# Patient Record
Sex: Female | Born: 1975 | Race: White | Hispanic: No | Smoking: Current every day smoker
Health system: Southern US, Community
[De-identification: ages and names within clinical notes are randomized; demographics above are authoritative.]

## PROBLEM LIST (undated history)

## (undated) DIAGNOSIS — C801 Malignant (primary) neoplasm, unspecified: Secondary | ICD-10-CM

## (undated) DIAGNOSIS — K219 Gastro-esophageal reflux disease without esophagitis: Secondary | ICD-10-CM

## (undated) DIAGNOSIS — I10 Essential (primary) hypertension: Secondary | ICD-10-CM

## (undated) DIAGNOSIS — G8929 Other chronic pain: Secondary | ICD-10-CM

## (undated) DIAGNOSIS — N96 Recurrent pregnancy loss: Secondary | ICD-10-CM

## (undated) DIAGNOSIS — F32A Depression, unspecified: Secondary | ICD-10-CM

## (undated) DIAGNOSIS — R569 Unspecified convulsions: Secondary | ICD-10-CM

## (undated) DIAGNOSIS — N390 Urinary tract infection, site not specified: Secondary | ICD-10-CM

## (undated) DIAGNOSIS — C189 Malignant neoplasm of colon, unspecified: Secondary | ICD-10-CM

## (undated) DIAGNOSIS — R519 Headache, unspecified: Secondary | ICD-10-CM

## (undated) DIAGNOSIS — E0590 Thyrotoxicosis, unspecified without thyrotoxic crisis or storm: Secondary | ICD-10-CM

## (undated) DIAGNOSIS — F419 Anxiety disorder, unspecified: Secondary | ICD-10-CM

## (undated) DIAGNOSIS — F329 Major depressive disorder, single episode, unspecified: Secondary | ICD-10-CM

## (undated) DIAGNOSIS — T7840XA Allergy, unspecified, initial encounter: Secondary | ICD-10-CM

## (undated) DIAGNOSIS — E0500 Thyrotoxicosis with diffuse goiter without thyrotoxic crisis or storm: Secondary | ICD-10-CM

## (undated) DIAGNOSIS — K5730 Diverticulosis of large intestine without perforation or abscess without bleeding: Secondary | ICD-10-CM

## (undated) DIAGNOSIS — R51 Headache: Secondary | ICD-10-CM

## (undated) DIAGNOSIS — K8020 Calculus of gallbladder without cholecystitis without obstruction: Secondary | ICD-10-CM

## (undated) HISTORY — DX: Headache: R51

## (undated) HISTORY — DX: Malignant neoplasm of colon, unspecified: C18.9

## (undated) HISTORY — DX: Diverticulosis of large intestine without perforation or abscess without bleeding: K57.30

## (undated) HISTORY — PX: PORTACATH PLACEMENT: SHX2246

## (undated) HISTORY — PX: ORIF ACETABULAR FRACTURE: SHX5029

## (undated) HISTORY — DX: Gastro-esophageal reflux disease without esophagitis: K21.9

## (undated) HISTORY — DX: Anxiety disorder, unspecified: F41.9

## (undated) HISTORY — DX: Allergy, unspecified, initial encounter: T78.40XA

## (undated) HISTORY — DX: Calculus of gallbladder without cholecystitis without obstruction: K80.20

## (undated) HISTORY — DX: Depression, unspecified: F32.A

## (undated) HISTORY — PX: OTHER SURGICAL HISTORY: SHX169

## (undated) HISTORY — DX: Major depressive disorder, single episode, unspecified: F32.9

## (undated) HISTORY — DX: Recurrent pregnancy loss: N96

## (undated) HISTORY — DX: Headache, unspecified: R51.9

## (undated) HISTORY — PX: DILATION AND CURETTAGE OF UTERUS: SHX78

## (undated) HISTORY — DX: Other chronic pain: G89.29

## (undated) HISTORY — DX: Malignant (primary) neoplasm, unspecified: C80.1

## (undated) HISTORY — DX: Urinary tract infection, site not specified: N39.0

## (undated) HISTORY — DX: Unspecified convulsions: R56.9

## (undated) HISTORY — PX: HAND SURGERY: SHX662

## (undated) HISTORY — DX: Thyrotoxicosis, unspecified without thyrotoxic crisis or storm: E05.90

---
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
## 1997-10-22 ENCOUNTER — Emergency Department (HOSPITAL_COMMUNITY): Admission: EM | Admit: 1997-10-22 | Discharge: 1997-10-22 | Payer: Self-pay

## 1997-11-15 ENCOUNTER — Emergency Department (HOSPITAL_COMMUNITY): Admission: EM | Admit: 1997-11-15 | Discharge: 1997-11-15 | Payer: Self-pay | Admitting: *Deleted

## 1998-01-22 ENCOUNTER — Inpatient Hospital Stay (HOSPITAL_COMMUNITY): Admission: AD | Admit: 1998-01-22 | Discharge: 1998-01-22 | Payer: Self-pay | Admitting: *Deleted

## 1998-01-23 ENCOUNTER — Encounter: Payer: Self-pay | Admitting: *Deleted

## 1998-01-23 ENCOUNTER — Inpatient Hospital Stay (HOSPITAL_COMMUNITY): Admission: AD | Admit: 1998-01-23 | Discharge: 1998-01-23 | Payer: Self-pay | Admitting: *Deleted

## 1998-02-27 ENCOUNTER — Inpatient Hospital Stay (HOSPITAL_COMMUNITY): Admission: AD | Admit: 1998-02-27 | Discharge: 1998-02-27 | Payer: Self-pay | Admitting: Obstetrics

## 2001-12-01 ENCOUNTER — Inpatient Hospital Stay (HOSPITAL_COMMUNITY): Admission: AD | Admit: 2001-12-01 | Discharge: 2001-12-01 | Payer: Self-pay | Admitting: Obstetrics and Gynecology

## 2003-11-07 ENCOUNTER — Emergency Department (HOSPITAL_COMMUNITY): Admission: EM | Admit: 2003-11-07 | Discharge: 2003-11-07 | Payer: Self-pay | Admitting: Family Medicine

## 2003-12-29 ENCOUNTER — Emergency Department (HOSPITAL_COMMUNITY): Admission: EM | Admit: 2003-12-29 | Discharge: 2003-12-29 | Payer: Self-pay | Admitting: Emergency Medicine

## 2004-07-15 ENCOUNTER — Emergency Department (HOSPITAL_COMMUNITY): Admission: EM | Admit: 2004-07-15 | Discharge: 2004-07-15 | Payer: Self-pay | Admitting: Family Medicine

## 2004-07-15 ENCOUNTER — Ambulatory Visit (HOSPITAL_COMMUNITY): Admission: RE | Admit: 2004-07-15 | Discharge: 2004-07-15 | Payer: Self-pay | Admitting: Family Medicine

## 2006-07-26 ENCOUNTER — Emergency Department (HOSPITAL_COMMUNITY): Admission: EM | Admit: 2006-07-26 | Discharge: 2006-07-26 | Payer: Self-pay | Admitting: Family Medicine

## 2006-08-13 ENCOUNTER — Emergency Department (HOSPITAL_COMMUNITY): Admission: EM | Admit: 2006-08-13 | Discharge: 2006-08-13 | Payer: Self-pay | Admitting: Family Medicine

## 2008-05-11 ENCOUNTER — Emergency Department (HOSPITAL_COMMUNITY): Admission: EM | Admit: 2008-05-11 | Discharge: 2008-05-11 | Payer: Self-pay | Admitting: Emergency Medicine

## 2008-06-19 ENCOUNTER — Inpatient Hospital Stay (HOSPITAL_COMMUNITY): Admission: AD | Admit: 2008-06-19 | Discharge: 2008-06-19 | Payer: Self-pay | Admitting: Obstetrics & Gynecology

## 2008-07-11 ENCOUNTER — Inpatient Hospital Stay (HOSPITAL_COMMUNITY): Admission: AD | Admit: 2008-07-11 | Discharge: 2008-07-11 | Payer: Self-pay | Admitting: Obstetrics and Gynecology

## 2008-08-06 ENCOUNTER — Ambulatory Visit (HOSPITAL_COMMUNITY): Admission: RE | Admit: 2008-08-06 | Discharge: 2008-08-06 | Payer: Self-pay | Admitting: Obstetrics & Gynecology

## 2008-08-06 ENCOUNTER — Ambulatory Visit: Payer: Self-pay | Admitting: Obstetrics & Gynecology

## 2008-08-07 ENCOUNTER — Encounter: Payer: Self-pay | Admitting: Obstetrics & Gynecology

## 2008-08-07 ENCOUNTER — Encounter: Payer: Self-pay | Admitting: Obstetrics and Gynecology

## 2008-08-07 LAB — CONVERTED CEMR LAB
GC Probe Amp, Genital: NEGATIVE
Yeast Wet Prep HPF POC: NONE SEEN

## 2008-08-16 ENCOUNTER — Inpatient Hospital Stay (HOSPITAL_COMMUNITY): Admission: AD | Admit: 2008-08-16 | Discharge: 2008-08-17 | Payer: Self-pay | Admitting: Obstetrics & Gynecology

## 2008-08-16 ENCOUNTER — Ambulatory Visit: Payer: Self-pay | Admitting: Family Medicine

## 2008-08-16 ENCOUNTER — Encounter: Payer: Self-pay | Admitting: Family Medicine

## 2008-09-02 ENCOUNTER — Ambulatory Visit: Payer: Self-pay | Admitting: Obstetrics and Gynecology

## 2008-09-03 ENCOUNTER — Encounter: Payer: Self-pay | Admitting: Obstetrics & Gynecology

## 2008-09-03 LAB — CONVERTED CEMR LAB: hCG, Beta Chain, Quant, S: 2.1 milliintl units/mL

## 2008-09-07 ENCOUNTER — Ambulatory Visit: Payer: Self-pay | Admitting: Family Medicine

## 2008-09-07 DIAGNOSIS — J309 Allergic rhinitis, unspecified: Secondary | ICD-10-CM | POA: Insufficient documentation

## 2008-09-07 DIAGNOSIS — J45909 Unspecified asthma, uncomplicated: Secondary | ICD-10-CM | POA: Insufficient documentation

## 2008-09-07 DIAGNOSIS — E0590 Thyrotoxicosis, unspecified without thyrotoxic crisis or storm: Secondary | ICD-10-CM | POA: Insufficient documentation

## 2008-09-09 ENCOUNTER — Encounter: Payer: Self-pay | Admitting: Family Medicine

## 2008-09-17 ENCOUNTER — Emergency Department (HOSPITAL_COMMUNITY): Admission: EM | Admit: 2008-09-17 | Discharge: 2008-09-18 | Payer: Self-pay | Admitting: Emergency Medicine

## 2008-10-22 ENCOUNTER — Encounter: Payer: Self-pay | Admitting: *Deleted

## 2009-01-09 ENCOUNTER — Encounter (INDEPENDENT_AMBULATORY_CARE_PROVIDER_SITE_OTHER): Payer: Self-pay | Admitting: *Deleted

## 2009-01-09 DIAGNOSIS — F17200 Nicotine dependence, unspecified, uncomplicated: Secondary | ICD-10-CM | POA: Insufficient documentation

## 2009-04-15 ENCOUNTER — Emergency Department (HOSPITAL_COMMUNITY): Admission: EM | Admit: 2009-04-15 | Discharge: 2009-04-15 | Payer: Self-pay | Admitting: Family Medicine

## 2009-04-28 ENCOUNTER — Ambulatory Visit: Payer: Self-pay | Admitting: Obstetrics & Gynecology

## 2009-04-28 ENCOUNTER — Encounter: Payer: Self-pay | Admitting: Obstetrics and Gynecology

## 2009-04-28 LAB — CONVERTED CEMR LAB
Eosinophils Relative: 4 % (ref 0–5)
HCT: 39.7 % (ref 36.0–46.0)
Hemoglobin: 12.8 g/dL (ref 12.0–15.0)
Hepatitis B Surface Ag: NEGATIVE
Lymphocytes Relative: 19 % (ref 12–46)
Lymphs Abs: 1.8 10*3/uL (ref 0.7–4.0)
Monocytes Absolute: 0.4 10*3/uL (ref 0.1–1.0)
Monocytes Relative: 5 % (ref 3–12)
Neutro Abs: 6.6 10*3/uL (ref 1.7–7.7)
RBC: 4.75 M/uL (ref 3.87–5.11)
Rh Type: POSITIVE
Rubella: >500 intl units/mL — ABNORMAL HIGH

## 2009-04-30 ENCOUNTER — Ambulatory Visit (HOSPITAL_COMMUNITY): Admission: RE | Admit: 2009-04-30 | Discharge: 2009-04-30 | Payer: Self-pay | Admitting: Obstetrics & Gynecology

## 2009-05-13 ENCOUNTER — Other Ambulatory Visit: Payer: Self-pay | Admitting: Obstetrics and Gynecology

## 2009-05-13 ENCOUNTER — Encounter: Payer: Self-pay | Admitting: Family Medicine

## 2009-05-13 ENCOUNTER — Ambulatory Visit: Payer: Self-pay | Admitting: Obstetrics and Gynecology

## 2009-06-01 ENCOUNTER — Ambulatory Visit (HOSPITAL_COMMUNITY): Admission: RE | Admit: 2009-06-01 | Discharge: 2009-06-01 | Payer: Self-pay | Admitting: Family Medicine

## 2009-06-03 ENCOUNTER — Ambulatory Visit: Payer: Self-pay | Admitting: Obstetrics & Gynecology

## 2009-06-03 ENCOUNTER — Encounter: Payer: Self-pay | Admitting: Obstetrics & Gynecology

## 2009-06-03 LAB — CONVERTED CEMR LAB
Free T4: 3.23 ng/dL — ABNORMAL HIGH (ref 0.80–1.80)
T3, Free: 12.3 pg/mL — ABNORMAL HIGH (ref 2.3–4.2)
TSH: 0.011 microintl units/mL — ABNORMAL LOW (ref 0.350–4.500)

## 2009-06-11 ENCOUNTER — Encounter: Payer: Self-pay | Admitting: Family Medicine

## 2009-06-11 ENCOUNTER — Ambulatory Visit (HOSPITAL_COMMUNITY): Admission: RE | Admit: 2009-06-11 | Discharge: 2009-06-11 | Payer: Self-pay | Admitting: Family Medicine

## 2009-06-24 ENCOUNTER — Ambulatory Visit: Payer: Self-pay | Admitting: Family Medicine

## 2009-07-16 ENCOUNTER — Ambulatory Visit (HOSPITAL_COMMUNITY): Admission: RE | Admit: 2009-07-16 | Discharge: 2009-07-16 | Payer: Self-pay | Admitting: Family Medicine

## 2009-07-16 ENCOUNTER — Encounter: Payer: Self-pay | Admitting: Family Medicine

## 2009-07-25 ENCOUNTER — Ambulatory Visit: Payer: Self-pay | Admitting: Obstetrics and Gynecology

## 2009-07-25 ENCOUNTER — Inpatient Hospital Stay (HOSPITAL_COMMUNITY): Admission: AD | Admit: 2009-07-25 | Discharge: 2009-07-26 | Payer: Self-pay | Admitting: Family Medicine

## 2009-07-25 ENCOUNTER — Ambulatory Visit: Payer: Self-pay | Admitting: Physician Assistant

## 2009-07-26 ENCOUNTER — Encounter: Payer: Self-pay | Admitting: Family Medicine

## 2009-08-01 DEATH — deceased

## 2010-01-16 ENCOUNTER — Encounter: Payer: Self-pay | Admitting: Family Medicine

## 2010-04-09 ENCOUNTER — Inpatient Hospital Stay (HOSPITAL_COMMUNITY)
Admission: AD | Admit: 2010-04-09 | Discharge: 2010-04-10 | Payer: Self-pay | Source: Home / Self Care | Attending: Obstetrics and Gynecology | Admitting: Obstetrics and Gynecology

## 2010-04-12 ENCOUNTER — Ambulatory Visit: Admit: 2010-04-12 | Payer: Self-pay | Admitting: Family Medicine

## 2010-04-18 LAB — CBC
HCT: 34.2 % — ABNORMAL LOW (ref 36.0–46.0)
Hemoglobin: 11.2 g/dL — ABNORMAL LOW (ref 12.0–15.0)
MCH: 27.4 pg (ref 26.0–34.0)
MCHC: 32.7 g/dL (ref 30.0–36.0)
MCV: 83.6 fL (ref 78.0–100.0)
Platelets: 315 10*3/uL (ref 150–400)
RBC: 4.09 MIL/uL (ref 3.87–5.11)
RDW: 13.8 % (ref 11.5–15.5)
WBC: 16.7 10*3/uL — ABNORMAL HIGH (ref 4.0–10.5)

## 2010-04-18 LAB — T3 UPTAKE: T3 Uptake Ratio: 30.7 % (ref 22.5–37.0)

## 2010-04-18 LAB — URINALYSIS, ROUTINE W REFLEX MICROSCOPIC
Bilirubin Urine: NEGATIVE
Hgb urine dipstick: NEGATIVE
Ketones, ur: NEGATIVE mg/dL
Nitrite: NEGATIVE
Protein, ur: NEGATIVE mg/dL
Specific Gravity, Urine: <1.005 — ABNORMAL LOW (ref 1.005–1.030)
Urine Glucose, Fasting: NEGATIVE mg/dL
Urobilinogen, UA: 0.2 mg/dL (ref 0.0–1.0)
pH: 6 (ref 5.0–8.0)

## 2010-04-18 LAB — GC/CHLAMYDIA PROBE AMP, GENITAL
Chlamydia, DNA Probe: NEGATIVE
GC Probe Amp, Genital: NEGATIVE

## 2010-04-18 LAB — TYPE AND SCREEN
ABO/RH(D): AB POS
Antibody Screen: NEGATIVE

## 2010-04-18 LAB — TSH: TSH: <0.008 u[IU]/mL — ABNORMAL LOW (ref 0.350–4.500)

## 2010-04-18 LAB — WET PREP, GENITAL
Clue Cells Wet Prep HPF POC: NONE SEEN
Trich, Wet Prep: NONE SEEN
WBC, Wet Prep HPF POC: NONE SEEN
Yeast Wet Prep HPF POC: NONE SEEN

## 2010-04-18 LAB — RAPID HIV SCREEN (WH-MAU): Rapid HIV Screen: NEGATIVE — AB

## 2010-04-18 LAB — T4: T4, Total: 27.2 ug/dL — ABNORMAL HIGH (ref 5.0–12.5)

## 2010-04-18 LAB — RPR: RPR Ser Ql: NONREACTIVE

## 2010-04-18 LAB — ABO/RH: ABO/RH(D): AB POS

## 2010-04-24 ENCOUNTER — Encounter: Payer: Self-pay | Admitting: Family Medicine

## 2010-04-25 ENCOUNTER — Encounter: Payer: Self-pay | Admitting: Family Medicine

## 2010-04-27 ENCOUNTER — Ambulatory Visit
Admission: RE | Admit: 2010-04-27 | Discharge: 2010-04-27 | Payer: Self-pay | Source: Home / Self Care | Attending: Obstetrics and Gynecology | Admitting: Obstetrics and Gynecology

## 2010-04-27 ENCOUNTER — Encounter: Payer: Self-pay | Admitting: Physician Assistant

## 2010-04-27 LAB — CONVERTED CEMR LAB: Rubella: >500 intl units/mL — ABNORMAL HIGH

## 2010-04-27 LAB — POCT URINALYSIS DIPSTICK
Hgb urine dipstick: NEGATIVE
Protein, ur: 30 mg/dL — AB
Specific Gravity, Urine: 1.02 (ref 1.005–1.030)
pH: 5.5 (ref 5.0–8.0)

## 2010-04-28 ENCOUNTER — Inpatient Hospital Stay (HOSPITAL_COMMUNITY)
Admission: AD | Admit: 2010-04-28 | Discharge: 2010-05-01 | Payer: Self-pay | Source: Home / Self Care | Attending: Obstetrics & Gynecology | Admitting: Obstetrics & Gynecology

## 2010-04-28 ENCOUNTER — Encounter: Payer: Self-pay | Admitting: Physician Assistant

## 2010-04-28 LAB — CONVERTED CEMR LAB
Amphetamine Screen, Ur: NEGATIVE
Barbiturate Quant, Ur: NEGATIVE
Cocaine Metabolites: NEGATIVE

## 2010-04-28 LAB — URINALYSIS, ROUTINE W REFLEX MICROSCOPIC
Bilirubin Urine: NEGATIVE
Ketones, ur: NEGATIVE mg/dL
Nitrite: NEGATIVE
Protein, ur: NEGATIVE mg/dL
Urine Glucose, Fasting: NEGATIVE mg/dL

## 2010-04-28 LAB — WET PREP, GENITAL
Trich, Wet Prep: NONE SEEN
Yeast Wet Prep HPF POC: NONE SEEN

## 2010-04-28 LAB — CBC
HCT: 33.9 % — ABNORMAL LOW (ref 36.0–46.0)
Hemoglobin: 11.1 g/dL — ABNORMAL LOW (ref 12.0–15.0)
MCHC: 32.4 g/dL (ref 30.0–36.0)
Platelets: 448 10*3/uL — ABNORMAL HIGH (ref 150–400)
RBC: 4.13 MIL/uL (ref 3.87–5.11)
RDW: 13.1 % (ref 11.5–15.5)
WBC: 14.7 10*3/uL — ABNORMAL HIGH (ref 4.0–10.5)

## 2010-04-28 LAB — LIPASE, BLOOD: Lipase: 14 U/L (ref 11–59)

## 2010-04-28 LAB — RAPID URINE DRUG SCREEN, HOSP PERFORMED
Benzodiazepines: NOT DETECTED
Tetrahydrocannabinol: NOT DETECTED

## 2010-04-28 LAB — COMPREHENSIVE METABOLIC PANEL
BUN: 5 mg/dL — ABNORMAL LOW (ref 6–23)
CO2: 22 mEq/L (ref 19–32)
Chloride: 103 mEq/L (ref 96–112)
Creatinine, Ser: 0.61 mg/dL (ref 0.4–1.2)
GFR calc non Af Amer: >60 mL/min (ref >60)
Glucose, Bld: 109 mg/dL — ABNORMAL HIGH (ref 70–99)
Total Bilirubin: 0.4 mg/dL (ref 0.3–1.2)

## 2010-04-28 LAB — AMYLASE: Amylase: 31 U/L (ref 0–105)

## 2010-04-28 LAB — TSH: TSH: 0.012 u[IU]/mL — ABNORMAL LOW (ref 0.350–4.500)

## 2010-04-29 ENCOUNTER — Ambulatory Visit (HOSPITAL_COMMUNITY)
Admission: RE | Admit: 2010-04-29 | Discharge: 2010-04-29 | Payer: Self-pay | Source: Home / Self Care | Attending: Obstetrics & Gynecology | Admitting: Obstetrics & Gynecology

## 2010-04-29 LAB — URINALYSIS, ROUTINE W REFLEX MICROSCOPIC
Bilirubin Urine: NEGATIVE
Specific Gravity, Urine: >1.03 — ABNORMAL HIGH (ref 1.005–1.030)
Urine Glucose, Fasting: NEGATIVE mg/dL
Urobilinogen, UA: 0.2 mg/dL (ref 0.0–1.0)
pH: 6 (ref 5.0–8.0)

## 2010-04-29 LAB — COMPREHENSIVE METABOLIC PANEL
ALT: 13 U/L (ref 0–35)
AST: 13 U/L (ref 0–37)
Albumin: 2.5 g/dL — ABNORMAL LOW (ref 3.5–5.2)
Alkaline Phosphatase: 112 U/L (ref 39–117)
Calcium: 7.6 mg/dL — ABNORMAL LOW (ref 8.4–10.5)
GFR calc Af Amer: >60 mL/min (ref >60)
Glucose, Bld: 110 mg/dL — ABNORMAL HIGH (ref 70–99)
Potassium: 3.8 mEq/L (ref 3.5–5.1)
Sodium: 134 mEq/L — ABNORMAL LOW (ref 135–145)
Total Protein: 5.6 g/dL — ABNORMAL LOW (ref 6.0–8.3)

## 2010-04-29 LAB — CBC
HCT: 33.4 % — ABNORMAL LOW (ref 36.0–46.0)
HCT: 31.9 % — ABNORMAL LOW (ref 36.0–46.0)
MCHC: 32.3 g/dL (ref 30.0–36.0)
MCHC: 33.2 g/dL (ref 30.0–36.0)
MCV: 81.8 fL (ref 78.0–100.0)
Platelets: 414 10*3/uL — ABNORMAL HIGH (ref 150–400)
Platelets: 401 10*3/uL — ABNORMAL HIGH (ref 150–400)
RDW: 13.1 % (ref 11.5–15.5)
RDW: 13.2 % (ref 11.5–15.5)
WBC: 15.8 10*3/uL — ABNORMAL HIGH (ref 4.0–10.5)
WBC: 14.8 10*3/uL — ABNORMAL HIGH (ref 4.0–10.5)

## 2010-04-29 LAB — URINE MICROSCOPIC-ADD ON

## 2010-04-29 LAB — GC/CHLAMYDIA PROBE AMP, GENITAL: GC Probe Amp, Genital: NEGATIVE

## 2010-04-30 LAB — CBC
MCH: 27.1 pg (ref 26.0–34.0)
MCV: 83.1 fL (ref 78.0–100.0)
Platelets: 291 10*3/uL (ref 150–400)
RBC: 3.43 MIL/uL — ABNORMAL LOW (ref 3.87–5.11)
RDW: 13.2 % (ref 11.5–15.5)
WBC: 9.3 10*3/uL (ref 4.0–10.5)

## 2010-04-30 LAB — T3, FREE: T3, Free: 4 pg/mL (ref 2.3–4.2)

## 2010-05-04 DEATH — deceased

## 2010-05-05 ENCOUNTER — Emergency Department (HOSPITAL_COMMUNITY): Payer: Medicaid Other

## 2010-05-05 ENCOUNTER — Inpatient Hospital Stay (HOSPITAL_COMMUNITY)
Admission: EM | Admit: 2010-05-05 | Discharge: 2010-05-08 | DRG: 392 | Disposition: A | Discharge status: Home / Self Care | Payer: Medicaid Other | Attending: Surgery | Admitting: Surgery

## 2010-05-05 ENCOUNTER — Encounter (HOSPITAL_COMMUNITY): Payer: Self-pay

## 2010-05-05 DIAGNOSIS — Z9889 Other specified postprocedural states: Secondary | ICD-10-CM

## 2010-05-05 DIAGNOSIS — K8020 Calculus of gallbladder without cholecystitis without obstruction: Secondary | ICD-10-CM | POA: Diagnosis present

## 2010-05-05 DIAGNOSIS — F17200 Nicotine dependence, unspecified, uncomplicated: Secondary | ICD-10-CM | POA: Diagnosis present

## 2010-05-05 DIAGNOSIS — K630 Abscess of intestine: Secondary | ICD-10-CM | POA: Diagnosis present

## 2010-05-05 DIAGNOSIS — E0590 Thyrotoxicosis, unspecified without thyrotoxic crisis or storm: Secondary | ICD-10-CM | POA: Diagnosis present

## 2010-05-05 DIAGNOSIS — K5732 Diverticulitis of large intestine without perforation or abscess without bleeding: Principal | ICD-10-CM | POA: Diagnosis present

## 2010-05-05 LAB — COMPREHENSIVE METABOLIC PANEL
ALT: 10 U/L (ref 0–35)
Alkaline Phosphatase: 104 U/L (ref 39–117)
CO2: 23 mEq/L (ref 19–32)
GFR calc non Af Amer: >60 mL/min (ref >60)
Glucose, Bld: 108 mg/dL — ABNORMAL HIGH (ref 70–99)
Potassium: 4.1 mEq/L (ref 3.5–5.1)
Sodium: 137 mEq/L (ref 135–145)

## 2010-05-05 LAB — DIFFERENTIAL
Eosinophils Relative: 0 % (ref 0–5)
Lymphs Abs: 2.3 10*3/uL (ref 0.7–4.0)
Monocytes Relative: 6 % (ref 3–12)
Neutrophils Relative %: 85 % — ABNORMAL HIGH (ref 43–77)

## 2010-05-05 LAB — URINALYSIS, ROUTINE W REFLEX MICROSCOPIC
Ketones, ur: >80 mg/dL — AB
Nitrite: NEGATIVE
Urobilinogen, UA: 0.2 mg/dL (ref 0.0–1.0)
pH: 6 (ref 5.0–8.0)

## 2010-05-05 LAB — LIPASE, BLOOD: Lipase: 17 U/L (ref 11–59)

## 2010-05-05 LAB — CBC
HCT: 41.7 % (ref 36.0–46.0)
Hemoglobin: 13.8 g/dL (ref 12.0–15.0)
WBC: 25.4 10*3/uL — ABNORMAL HIGH (ref 4.0–10.5)

## 2010-05-05 NOTE — Miscellaneous (Signed)
Summary: prior auth  Clinical Lists Changes prior auth for zolpiden to pcp for completion...Marland KitchenMarland KitchenGolden Circle RN  September 09, 2008 10:07 AM     Complete.

## 2010-05-05 NOTE — Miscellaneous (Signed)
Summary: Asthma Update - Intermittent     New Problems: ASTHMA, INTERMITTENT (ICD-493.90)   New Problems: ASTHMA, INTERMITTENT (ICD-493.90) 

## 2010-05-05 NOTE — Assessment & Plan Note (Signed)
Summary: NP/Referred from [facility]   Vital Signs:  Patient profile:   35 year old female Weight:      187.4 pounds Temp:     98.8 degrees F oral Pulse rate:   111 / minute BP sitting:   136 / 85  (left arm)  Vitals Entered By: Alphia Kava (September 07, 2008 2:03 PM) 0. CC: NP Is Patient Diabetic? No   Primary Care Provider:  Helane Rima DO  CC:  NP.  History of Present Illness: [name] is a 35 year old woman presenting for new patient visit. She was recently hospitalized in the [facility] for a septic abortion during which time she received fluids, imipenem, gent, and underwent D&C. She was discharged with Rx for Cipro and Flagyl. She states that she finished both Abx. She did follow up at the [facility] 09/03/08. The patient was given Depo Provera prior to hospital DC.  While in the [facility], Dr. Shawnie Pons noticed that the patient had exopthalmos and checked thyroid labs:  TSH <0.004 free T4 2.53 T3 3.7  1. Hyperthyroid: see above studies. she does endorse significant weight loss last 6 months (242 to 187 today), she is hot all of the time, has trouble sleeping, has decreased appetite, heart races, +/- diarhea. Mom with hypothyroidism.  2. Asthma: only uses albuterol when she has a URI. needs a new Rx.  3. Seasonal Allergies: controlled with Zyrtec OTC.  4. Health Maintenance: Last PAP 7/09 [facility] + History of abnormal paps.    Habits & Providers  Alcohol-Tobacco-Diet     Alcohol drinks/day: 0     Tobacco Status: current     Cigarette Packs/Day: 0.5     Year Started: 1994  Exercise-Depression-Behavior     Does Patient Exercise: yes     Type of exercise: walking     Seat Belt Use: always     Sun Exposure: infrequent  Medications Prior to Update: 1)  None  Past History:  Past Medical History: G13P3 with multiple miscarriages Septic Abortion 2010 Hyperthyroid Asthma Allergic Rhinitis  Past Surgical History: D&C for miscarriages Metal plate in right  hand  Family History: Mother with hypothyroid, heart disease, diabetes  Social History: Lives with 3 daughters, mother, and step-father in [place]. They have dogs, hamster, ferret. Fun: play in the pool, walks in the park, plays with daughters. High school graduate. Divorced. Smoking Status:  current Packs/Day:  0.5 Does Patient Exercise:  yes Seat Belt Use:  always Sun Exposure-Excessive:  infrequent  Review of Systems       per HPI, otherwise negative  Physical Exam  General:  alert, well-developed, well-nourished, and well-hydrated.  vital signs reviewed. Head:  normocephalic and atraumatic.   Eyes:  exophthalmoses.   Ears:  R ear normal and L ear normal.   Nose:  no external deformity.   Mouth:  pharynx pink and moist.   Neck:  supple, slight thyroid fullness, full ROM.   Lungs:  normal respiratory effort, no accessory muscle use, normal breath sounds, no crackles, and no wheezes.   Heart:  regular rhythm and tachycardia.   Abdomen:  soft, non-tender, and normal bowel sounds.   Pulses:  R and L dorsalis pedis and posterior tibial pulses are full and equal bilaterally Extremities:  No clubbing, cyanosis, edema. Neurologic:  alert & oriented X3 and cranial nerves II-XII intact.   Skin:  Intact without suspicious lesions or rashes Psych:  Oriented X3, slightly hyperactive.   Impression & Recommendations:  Problem #  1:  HYPERTHYROIDISM (ICD-242.90) Assessment New Will send patient for RAI uptake scan. Will treat with Propranolol for peripheral effects, Ambien for sedation at hs, and PTU to decrease thyroid hormone production.  Her updated medication list for this problem includes:    Propranolol Hcl 40 Mg Tabs (Propranolol hcl) ..... One by mouth bid    Propylthiouracil 50 Mg Tabs (Propylthiouracil) .Marland Kitchen... 2 tabs by mouth three times a day  Orders: Radiology other (Radiology Other)  Problem # 2:  ASTHMA (ICD-493.90) Assessment: Unchanged Stable. Will refill. Her updated  medication list for this problem includes:    Proair Hfa 108 (90 Base) Mcg/act Aers (Albuterol sulfate) .Marland Kitchen... 2 inh q4h as needed shortness of breath  Problem # 3:  ALLERGIC RHINITIS (ICD-477.9) Assessment: Unchanged Rec: Zyrtec OTC. Stable at this time.  Problem # 4:  History of Septic Abortion   Complete Medication List: 1)  Proair Hfa 108 (90 Base) Mcg/act Aers (Albuterol sulfate) .... 2 inh q4h as needed shortness of breath 2)  Propranolol Hcl 40 Mg Tabs (Propranolol hcl) .... One by mouth bid 3)  Ambien 10 Mg Tabs (Zolpidem tartrate) .... One by mouth at hs 4)  Propylthiouracil 50 Mg Tabs (Propylthiouracil) .... 2 tabs by mouth three times a day  Patient Instructions: 1)  It was a pleasure meeting you today. 2)  We will schedule your thyroid scan. 3)  Follow-up in 1-2 weeks. Prescriptions: PROPYLTHIOURACIL 50 MG TABS (PROPYLTHIOURACIL) 2 tabs by mouth three times a day  #180 x 0   Entered and Authorized by:   Helane Rima MD   Signed by:   Helane Rima MD on 09/07/2008   Method used:   Print then Give to Patient   RxID:   [id number] AMBIEN 10 MG TABS (ZOLPIDEM TARTRATE) one by mouth at hs  #30 x 1   Entered and Authorized by:   Helane Rima MD   Signed by:   Helane Rima MD on 09/07/2008   Method used:   Print then Give to Patient   RxID:   [id number] PROPRANOLOL HCL 40 MG TABS (PROPRANOLOL HCL) one by mouth bid  #30 x 3   Entered and Authorized by:   Helane Rima MD   Signed by:   Helane Rima MD on 09/07/2008   Method used:   Print then Give to Patient   RxID:   [id number] PROAIR HFA 108 (90 BASE) MCG/ACT  AERS (ALBUTEROL SULFATE) 2 inh q4h as needed shortness of breath  #1 x 3   Entered and Authorized by:   Helane Rima MD   Signed by:   Helane Rima MD on 09/07/2008   Method used:   Print then Give to Patient   RxID:   [id number]

## 2010-05-06 ENCOUNTER — Inpatient Hospital Stay (HOSPITAL_COMMUNITY): Payer: Medicaid Other

## 2010-05-06 ENCOUNTER — Encounter (HOSPITAL_COMMUNITY): Payer: Self-pay | Admitting: Radiology

## 2010-05-06 LAB — APTT: aPTT: 40 seconds — ABNORMAL HIGH (ref 24–37)

## 2010-05-06 MED ORDER — IOHEXOL 300 MG/ML  SOLN
100.0000 mL | Freq: Once | INTRAMUSCULAR | Status: AC | PRN
  Administered 2010-05-06: 100 mL via INTRAVENOUS

## 2010-05-07 ENCOUNTER — Encounter: Payer: Self-pay | Admitting: *Deleted

## 2010-05-07 LAB — CBC
HCT: 35.7 % — ABNORMAL LOW (ref 36.0–46.0)
Hemoglobin: 11.6 g/dL — ABNORMAL LOW (ref 12.0–15.0)
MCH: 26.9 pg (ref 26.0–34.0)
MCHC: 32.5 g/dL (ref 30.0–36.0)
MCV: 82.8 fL (ref 78.0–100.0)
Platelets: 579 10*3/uL — ABNORMAL HIGH (ref 150–400)
RBC: 4.31 MIL/uL (ref 3.87–5.11)
RDW: 13.2 % (ref 11.5–15.5)
WBC: 12 10*3/uL — ABNORMAL HIGH (ref 4.0–10.5)

## 2010-05-07 LAB — DIFFERENTIAL
Basophils Absolute: 0 10*3/uL (ref 0.0–0.1)
Basophils Relative: 0 % (ref 0–1)
Eosinophils Absolute: 0.5 10*3/uL (ref 0.0–0.7)
Eosinophils Relative: 4 % (ref 0–5)
Lymphocytes Relative: 23 % (ref 12–46)
Lymphs Abs: 2.8 10*3/uL (ref 0.7–4.0)
Monocytes Absolute: 1 10*3/uL (ref 0.1–1.0)
Monocytes Relative: 8 % (ref 3–12)
Neutro Abs: 7.7 10*3/uL (ref 1.7–7.7)
Neutrophils Relative %: 64 % (ref 43–77)

## 2010-05-07 LAB — BASIC METABOLIC PANEL
BUN: 12 mg/dL (ref 6–23)
CO2: 25 mEq/L (ref 19–32)
Chloride: 108 mEq/L (ref 96–112)
GFR calc non Af Amer: >60 mL/min (ref >60)
Glucose, Bld: 105 mg/dL — ABNORMAL HIGH (ref 70–99)
Potassium: 4.3 mEq/L (ref 3.5–5.1)

## 2010-05-09 ENCOUNTER — Other Ambulatory Visit: Payer: Self-pay | Admitting: Surgery

## 2010-05-09 DIAGNOSIS — IMO0002 Reserved for concepts with insufficient information to code with codable children: Secondary | ICD-10-CM

## 2010-05-09 DIAGNOSIS — Z9889 Other specified postprocedural states: Secondary | ICD-10-CM

## 2010-05-10 LAB — CULTURE, BLOOD (ROUTINE X 2): Culture  Setup Time: 201202030325

## 2010-05-10 LAB — CULTURE, ROUTINE-ABSCESS: Culture: NO GROWTH

## 2010-05-11 ENCOUNTER — Ambulatory Visit
Admission: RE | Admit: 2010-05-11 | Discharge: 2010-05-11 | Disposition: A | Discharge status: Home / Self Care | Payer: No Typology Code available for payment source | Source: Ambulatory Visit | Attending: Surgery | Admitting: Surgery

## 2010-05-11 DIAGNOSIS — Z9889 Other specified postprocedural states: Secondary | ICD-10-CM

## 2010-05-11 DIAGNOSIS — IMO0002 Reserved for concepts with insufficient information to code with codable children: Secondary | ICD-10-CM

## 2010-05-11 MED ORDER — IOHEXOL 300 MG/ML  SOLN: 100.0000 mL | Freq: Once | INTRAMUSCULAR | Status: AC | PRN

## 2010-05-12 LAB — CULTURE, BLOOD (ROUTINE X 2): Culture  Setup Time: 201202030325

## 2010-05-14 NOTE — Discharge Summary (Signed)
  NAMEARLIE, POSCH               ACCOUNT NO.:  [id number]  MEDICAL RECORD NO.:  [id number]           PATIENT TYPE:  I  LOCATION:  1535                         FACILITY:  [facility]  PHYSICIAN:  Wilmon Arms. Corliss Skains, M.D. DATE OF BIRTH:  02-12-1976  DATE OF ADMISSION:  05/05/2010 DATE OF DISCHARGE:  05/08/2010                              DISCHARGE SUMMARY   ADMISSION DIAGNOSIS:  Diverticular abscess.  DISCHARGE DIAGNOSIS:  Diverticular abscess.  PROCEDURE:  Percutaneous drainage of diverticular abscess.  BRIEF HISTORY:  This is a 35 year old female who recently had a twin still birth on April 29, 2010.  She presents with worsening abdominal pain.  She does have known gallstones.  She presented to the emergency department for evaluation, and a CT scan showed a large pelvic abscess. She was then admitted to the hospital for further management.  HOSPITAL COURSE:  On May 06, 2010, the patient underwent percutaneous drainage of the abscess.  A large amount of clear yellow fluid was aspirated.  The patient has improved significantly since that time.  Her white count has decreased.  She has been afebrile.  She is advanced to regular diet.  Her pain is almost completely gone.  Cultures were still negative.  Blood culture initially showed gram-negative rods. She has been on Tygacil.  DISCHARGE INSTRUCTIONS:  She has been converted over to Cipro and Flagyl.  She is given Vicodin p.r.n. for pain.  She will manage her own drain.  My office will contact her for a repeat CT scan later in a week to see if her drain is ready for removal.  The patient should record output daily.     Wilmon Arms. Corliss Skains, M.D.     MKT/MEDQ  D:  05/08/2010  T:  05/08/2010  Job:  [id number]  Electronically Signed by Manus Rudd M.D. on 05/09/2010 04:13:36 PM

## 2010-05-16 NOTE — H&P (Signed)
[name], [name]               ACCOUNT NO.:  [id number]  MEDICAL RECORD NO.:  [id number]           PATIENT TYPE:  I  LOCATION:  1535                         [facility]ACILITY:  [facility]  PHYSICIAN:  Sandria Bales. Ezzard Standing, M.D.  DATE OF BIRTH:  1975-07-03  DATE OF ADMISSION:  05/05/2010                             HISTORY & PHYSICAL   HISTORY OF PRESENT ILLNESS:  This is a 35 year old white female,  goes to [facility] for primary care, though she has no single identified physician that she sees. I am not sure how regularly she goes there.  She has recently seen Dr. Scheryl Darter when she went into preterm labor and delivered twins on 2010-05-20, which both died.  They were estimated at [redacted] weeks gestational age.  She states she has had a lot of pain for 3-4 days before her preterm labor/miscarriage.  She was kept in the hospital for 2 days after her delivery because of the abdominal pain.  An ultrasound of her abdomen showed gallstones but nothing else specific, so she was discharged home on May 01, 2010.  Upon discharge, her abdominal pain continued, in fact, it just got worse, so she came to the [facility] Emergency Room on May 05, 2010.  She was seen by Dr. Wayland Salinas.  A CT scan obtained showed a large pelvic abscess.  Dr. Jonn Shingles pelvic exam saw no evidence of any cervical discharge or cervical tenderness.  I was consulted because of possible diverticular source to this abscess.  From the GI standpoint, the patient has known gallstones.  These actually were seen again on ultrasound on April 28, 2010.  She states she saw someone at [facility] for the gall stones, but cannot remember his name, about 2 years ago.  At least at that time, no surgery was performed.  She was given some medicine for reflux, Nexium, which she has been on since that time.  She denies any liver disease, pancreatic disease, or prior known colon disease.  From the GYN  standpoint, she has had 15 pregnancies.  She has 3 living children.  Apparently, her oldest, a 15 year old girl, has some disabilities with epilepsy and she has an 36 year old and 6-year-old. Prior to this pregnancy, she apparently had a pregnancy in April of 2011, this resulted I think in a premature labor.  Before that, she had a pregnancy in May 2010, this delivered in a premature labor.  She has had a septic abortion in the past.  PAST MEDICAL HISTORY:  She states she is allergic to CODEINE, which leads to hives and nausea, over 10 years ago.  PENICILLIN leads to hives and nausea, was over 10 years ago.  SULFA caused nausea and vomiting, in the last 5 years.  HOME MEDICATIONS: 1. Motrin. 2. Nexium. 3. Propranolol. 4. Propylthiouracil. 5. Admits to taking Tylenol. 6. Prenatal vitamins.  REVIEW OF SYSTEMS:   NEUROLOGIC:  She states she had seizures as a child for unknown reasons.  She was on phenobarbital, last seizure she thinks was age 79 or 80.  She denies problems as an adult.   CARDIAC:  She has had no heart disease, chest pain, or hypertension.   PULMONARY:  She smokes half a pack to a pack of cigarettes a day, knows it is bad for health. Actually has asthma.  She has inhaler, she uses Ventolin inhaler whenever I guess her asthma acts up.   GASTROINTESTINAL:  See history of present illness.   UROLOGIC:  She has had bladder infection in the past. No history of kidney stones.  GYN:  See history of present illness. MUSCULOSKELETAL:  No joint or back trouble.  SOCIAL HISTORY:  She has 3 children, ages 92, who has epilepsy and an 36 year old and 79-year-old.  She lives with her sister, Ivan Croft.  Her husband died in 09/06/2001 but she is now engaged to be married to Tommi Rumps who lives in [place].  He is not here right now.  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  PHYSICAL EXAMINATION:   VITAL SIGNS:  Temperature is 98.3, blood pressure is 125/56, and pulse is 110. HEENT:  Unremarkable.  She has a tongue ring in place. NECK:  Supple.  There are no masses or thyromegaly. LYMPH NODES:  She has no cervical, supraclavicular, or inguinal adenopathy. LUNGS:  Clear to auscultation. HEART:  Her pulse is about 100.  Has a regular rate and rhythm. BREASTS:  I did not examine her breasts. ABDOMEN:  She has mild distention.  She is sort of diffusely tender, not able to tolerable much of an abdominal exam, has decreased bowel sounds. PELVIC:  I did not repeat her pelvic exam. EXTREMITIES:  Good strength in upper and lower extremities. NEUROLOGIC:  Grossly intact.  LABORATORY DATA:  Sodium 137, potassium 4.1, chloride 101, CO2 of 23, glucose of 108, and BUN of 6.  Her total protein is 7.5, albumin 3.2. Her lipase is 17.  Her hemoglobin is 13.8, hematocrit 41.  White blood count is 25, 400.  Platelet count 650,000.  She underwent again the CT scan read by Dr. Myles Rosenthal which shows a large anterior pelvic abscess measuring 6 x 13 cm thought to be possibly diverticular origin and she has cholelithiasis.  IMPRESSION AND PLAN: 1. Large pelvic abscess, probably diverticular.  Plan is IV     antibiotics and percutaneous drainage of this in the morning.     Whether she will need something else done to her colon long term     remains to be seen.  I discussed this with her, but she does show a     lot of insight into some of her disease processes. 2. Recent premature labor, she delivered twins.  I spoke to Dr. Tinnie Gens who was on call for Dr. Debroah Loop.  I have asked that they see     her in consultation because of the proximity of this abscess or     this infection time wise to her premature labor, though at least     this time it appears unrelated to gynecologic cause. 3. Smokes cigarettes, knows it is bad for health. 4. Asthma, probably exacerbated by her smoking. 5.  Hyperthyroidism. 6. History of gallstones, assymptomatic.   Sandria Bales. Ezzard Standing, M.D., FACS   DHN/MEDQ  D:  05/06/2010  T:  05/06/2010  Job:  [id number]  cc:   Scheryl Darter, MD  Shelbie Proctor. Shawnie Pons, M.D.  [facility]  Electronically Signed by Ovidio Kin M.D. on 05/16/2010 12:33:27 PM

## 2010-05-17 ENCOUNTER — Other Ambulatory Visit: Payer: Self-pay | Admitting: Surgery

## 2010-05-17 DIAGNOSIS — IMO0002 Reserved for concepts with insufficient information to code with codable children: Secondary | ICD-10-CM

## 2010-05-23 ENCOUNTER — Ambulatory Visit
Admission: RE | Admit: 2010-05-23 | Discharge: 2010-05-23 | Disposition: A | Discharge status: Home / Self Care | Payer: No Typology Code available for payment source | Source: Ambulatory Visit | Attending: Surgery | Admitting: Surgery

## 2010-05-23 DIAGNOSIS — IMO0002 Reserved for concepts with insufficient information to code with codable children: Secondary | ICD-10-CM

## 2010-05-23 MED ORDER — IOHEXOL 300 MG/ML  SOLN
100.0000 mL | Freq: Once | INTRAMUSCULAR | Status: AC | PRN
  Administered 2010-05-23: 100 mL via INTRAVENOUS

## 2010-05-24 ENCOUNTER — Other Ambulatory Visit: Payer: Self-pay | Admitting: Surgery

## 2010-05-24 DIAGNOSIS — IMO0002 Reserved for concepts with insufficient information to code with codable children: Secondary | ICD-10-CM

## 2010-05-26 ENCOUNTER — Emergency Department (HOSPITAL_COMMUNITY)
Admission: EM | Admit: 2010-05-26 | Discharge: 2010-05-26 | Discharge status: Against Medical Advice | Payer: Medicaid Other | Attending: Emergency Medicine | Admitting: Emergency Medicine

## 2010-05-26 DIAGNOSIS — R509 Fever, unspecified: Secondary | ICD-10-CM | POA: Insufficient documentation

## 2010-05-26 DIAGNOSIS — Z9889 Other specified postprocedural states: Secondary | ICD-10-CM | POA: Insufficient documentation

## 2010-05-29 ENCOUNTER — Emergency Department (HOSPITAL_COMMUNITY): Payer: Medicaid Other

## 2010-05-29 ENCOUNTER — Emergency Department (HOSPITAL_COMMUNITY)
Admission: EM | Admit: 2010-05-29 | Discharge: 2010-05-29 | Disposition: A | Discharge status: Home / Self Care | Payer: Medicaid Other | Attending: Emergency Medicine | Admitting: Emergency Medicine

## 2010-05-29 DIAGNOSIS — N731 Chronic parametritis and pelvic cellulitis: Secondary | ICD-10-CM | POA: Insufficient documentation

## 2010-05-29 DIAGNOSIS — Z9889 Other specified postprocedural states: Secondary | ICD-10-CM | POA: Insufficient documentation

## 2010-05-29 DIAGNOSIS — O99893 Other specified diseases and conditions complicating puerperium: Secondary | ICD-10-CM | POA: Insufficient documentation

## 2010-05-29 DIAGNOSIS — N949 Unspecified condition associated with female genital organs and menstrual cycle: Secondary | ICD-10-CM | POA: Insufficient documentation

## 2010-05-29 DIAGNOSIS — R109 Unspecified abdominal pain: Secondary | ICD-10-CM | POA: Insufficient documentation

## 2010-05-29 DIAGNOSIS — O85 Puerperal sepsis: Secondary | ICD-10-CM | POA: Insufficient documentation

## 2010-05-29 LAB — DIFFERENTIAL
Basophils Absolute: 0 10*3/uL (ref 0.0–0.1)
Basophils Relative: 0 % (ref 0–1)
Eosinophils Absolute: 0.3 10*3/uL (ref 0.0–0.7)
Monocytes Absolute: 0.7 10*3/uL (ref 0.1–1.0)
Neutro Abs: 6.7 10*3/uL (ref 1.7–7.7)
Neutrophils Relative %: 70 % (ref 43–77)

## 2010-05-29 LAB — CBC
Hemoglobin: 13.1 g/dL (ref 12.0–15.0)
MCHC: 32 g/dL (ref 30.0–36.0)
Platelets: 353 10*3/uL (ref 150–400)
RBC: 4.93 MIL/uL (ref 3.87–5.11)

## 2010-05-29 LAB — COMPREHENSIVE METABOLIC PANEL
ALT: 15 U/L (ref 0–35)
AST: 17 U/L (ref 0–37)
Albumin: 3.5 g/dL (ref 3.5–5.2)
CO2: 26 mEq/L (ref 19–32)
Calcium: 9 mg/dL (ref 8.4–10.5)
Creatinine, Ser: 0.74 mg/dL (ref 0.4–1.2)
GFR calc Af Amer: >60 mL/min (ref >60)
GFR calc non Af Amer: >60 mL/min (ref >60)
Sodium: 138 mEq/L (ref 135–145)

## 2010-05-29 LAB — URINALYSIS, ROUTINE W REFLEX MICROSCOPIC
Specific Gravity, Urine: 1.035 — ABNORMAL HIGH (ref 1.005–1.030)
Urine Glucose, Fasting: NEGATIVE mg/dL
pH: 5.5 (ref 5.0–8.0)

## 2010-05-29 LAB — URINE MICROSCOPIC-ADD ON

## 2010-05-29 MED ORDER — IOHEXOL 300 MG/ML  SOLN
100.0000 mL | Freq: Once | INTRAMUSCULAR | Status: AC | PRN
  Administered 2010-05-29: 100 mL via INTRAVENOUS

## 2010-05-30 ENCOUNTER — Other Ambulatory Visit (HOSPITAL_COMMUNITY): Payer: Self-pay | Admitting: Emergency Medicine

## 2010-05-30 DIAGNOSIS — IMO0002 Reserved for concepts with insufficient information to code with codable children: Secondary | ICD-10-CM

## 2010-05-31 ENCOUNTER — Other Ambulatory Visit: Payer: Self-pay

## 2010-06-06 NOTE — Consult Note (Signed)
  [name], [name]               ACCOUNT NO.:  [id number]  MEDICAL RECORD NO.:  [id number]           PATIENT TYPE:  E  LOCATION:  WLED                         FACILITY:  [facility]  PHYSICIAN:  Thornton Park. Daphine Deutscher, MD  DATE OF BIRTH:  [birth year]  DATE OF CONSULTATION: DATE OF DISCHARGE:  05/29/2010                                CONSULTATION   CHIEF COMPLAINT:  Apparent diverticular pelvic abscess.  HISTORY:  This is a 35 year old woman who underwent twin stillbirths in January and was discharged home on the 29th.  In retrospect, she had been having abdominal pain prior to her miscarriage and after.  She was found to have a pelvic abscess which has been drained percutaneously. She comes in this evening complaining that there is some leakage around the tube when she irrigates and some more discomfort.  She also was complaining that issues with childcare and did not want to come in when I talked to her about that.  I reviewed the CT scan.  It looks like the drain, it needs to be in further.  There was some leaking from posterior extension of this but everything is pretty well walled off and localized.  She is not tachycardic, did not appear septic and this looks like in a more of a chronic process that if it is related to a diverticular process __________ would be a colocutaneous fistula, then it may need to be resected.  She has been followed by Dr. Fannie Knee and she has an appointment to see him back in the office.  She also has complained of lot of foul smelling around the drain grenade.  I talked about her needs and we went ahead and decided to replace that grenade tonight and also then set up CT-guided revision or repositioning of her pelvic drain.  Follow with Dr. Fannie Knee in the office.  DISPOSITION:  The patient to go home and will have repositioning scheduled for sometime this week.  Follow with Dr. Fannie Knee in the office, I believe on March 9.     Thornton Park Daphine Deutscher,  MD     MBM/MEDQ  D:  05/29/2010  T:  05/29/2010  Job:  [id number]  Electronically Signed by Luretha Murphy MD on 06/06/2010 01:34:40 PM

## 2010-06-08 ENCOUNTER — Other Ambulatory Visit (HOSPITAL_COMMUNITY): Payer: Self-pay | Admitting: Emergency Medicine

## 2010-06-08 ENCOUNTER — Encounter (HOSPITAL_COMMUNITY)
Admission: RE | Admit: 2010-06-08 | Discharge: 2010-06-08 | Disposition: A | Discharge status: Home / Self Care | Payer: Medicaid Other | Source: Ambulatory Visit | Attending: Emergency Medicine | Admitting: Emergency Medicine

## 2010-06-08 DIAGNOSIS — IMO0002 Reserved for concepts with insufficient information to code with codable children: Secondary | ICD-10-CM

## 2010-06-08 DIAGNOSIS — Z4682 Encounter for fitting and adjustment of non-vascular catheter: Secondary | ICD-10-CM | POA: Insufficient documentation

## 2010-06-08 DIAGNOSIS — K651 Peritoneal abscess: Secondary | ICD-10-CM | POA: Insufficient documentation

## 2010-06-08 MED ORDER — IOHEXOL 300 MG/ML  SOLN: 10.0000 mL | Freq: Once | INTRAMUSCULAR | Status: AC | PRN

## 2010-06-10 ENCOUNTER — Other Ambulatory Visit: Payer: Self-pay

## 2010-06-15 ENCOUNTER — Encounter: Payer: Self-pay | Admitting: Gastroenterology

## 2010-06-19 LAB — POCT URINALYSIS DIP (DEVICE)
Bilirubin Urine: NEGATIVE
Hgb urine dipstick: NEGATIVE
Ketones, ur: NEGATIVE mg/dL
Protein, ur: NEGATIVE mg/dL
Specific Gravity, Urine: 1.02 (ref 1.005–1.030)
pH: 7.5 (ref 5.0–8.0)

## 2010-06-21 ENCOUNTER — Other Ambulatory Visit: Payer: Self-pay | Admitting: Surgery

## 2010-06-21 DIAGNOSIS — IMO0002 Reserved for concepts with insufficient information to code with codable children: Secondary | ICD-10-CM

## 2010-06-21 LAB — CBC
HCT: 35.2 % — ABNORMAL LOW (ref 36.0–46.0)
MCHC: 33.6 g/dL (ref 30.0–36.0)
MCHC: 34.3 g/dL (ref 30.0–36.0)
MCV: 84.3 fL (ref 78.0–100.0)
MCV: 84.6 fL (ref 78.0–100.0)
Platelets: 234 10*3/uL (ref 150–400)
RBC: 3.87 MIL/uL (ref 3.87–5.11)
RDW: 13.9 % (ref 11.5–15.5)
WBC: 30 10*3/uL — ABNORMAL HIGH (ref 4.0–10.5)

## 2010-06-21 LAB — RPR: RPR Ser Ql: NONREACTIVE

## 2010-06-21 LAB — URINALYSIS, ROUTINE W REFLEX MICROSCOPIC
Glucose, UA: NEGATIVE mg/dL
Ketones, ur: NEGATIVE mg/dL
Leukocytes, UA: NEGATIVE
Nitrite: NEGATIVE
Protein, ur: NEGATIVE mg/dL
Urobilinogen, UA: 0.2 mg/dL (ref 0.0–1.0)

## 2010-06-21 LAB — GC/CHLAMYDIA PROBE AMP, GENITAL
Chlamydia, DNA Probe: NEGATIVE
GC Probe Amp, Genital: POSITIVE — AB

## 2010-06-21 LAB — STREP B DNA PROBE: Strep Group B Ag: NEGATIVE

## 2010-06-21 LAB — URINE MICROSCOPIC-ADD ON

## 2010-06-21 NOTE — Letter (Signed)
Summary: New Patient letter  [facility]  [address] [place], [place] [address]   Phone: [phone]  Fax: [phone]       06/15/2010 MRN: [id number]  [name] [address] [place], [place]  [address]  Dear Ms. [facility],  Welcome to the Gastroenterology Division at [facility].    You are scheduled to see Dr.  Jarold Motto on 07-19-10 at 9:30am on the 3rd floor at [facility], [address]  We ask that you try to arrive at our office 15 minutes prior to your appointment time to allow for check-in.  We would like you to complete the enclosed self-administered evaluation form prior to your visit and bring it with you on the day of your appointment.  We will review it with you.  Also, please bring a complete list of all your medications or, if you prefer, bring the medication bottles and we will list them.  Please bring your insurance card so that we may make a copy of it.  If your insurance requires a referral to see a specialist, please bring your referral form from your primary care physician.  Co-payments are due at the time of your visit and may be paid by cash, check or credit card.     Your office visit will consist of a consult with your physician (includes a physical exam), any laboratory testing he/she may order, scheduling of any necessary diagnostic testing (e.g. x-ray, ultrasound, CT-scan), and scheduling of a procedure (e.g. Endoscopy, Colonoscopy) if required.  Please allow enough time on your schedule to allow for any/all of these possibilities.    If you cannot keep your appointment, please call [phone] to cancel or reschedule prior to your appointment date.  This allows [place] the opportunity to schedule an appointment for another patient in need of care.  If you do not cancel or reschedule by 5 p.m. the business day prior to your appointment date, you will be charged a $50.00 late cancellation/no-show fee.    Thank you for choosing [facility] for your medical needs.  We appreciate the opportunity to care for you.  Please visit [place] at our website  to learn more about our practice.                     Sincerely,                                                             The Gastroenterology Division

## 2010-06-22 ENCOUNTER — Ambulatory Visit
Admission: RE | Admit: 2010-06-22 | Discharge: 2010-06-22 | Disposition: A | Discharge status: Home / Self Care | Payer: Self-pay | Source: Ambulatory Visit | Attending: Surgery | Admitting: Surgery

## 2010-06-22 DIAGNOSIS — IMO0002 Reserved for concepts with insufficient information to code with codable children: Secondary | ICD-10-CM

## 2010-06-22 LAB — POCT URINALYSIS DIP (DEVICE)
Glucose, UA: NEGATIVE mg/dL
Nitrite: NEGATIVE
Urobilinogen, UA: 4 mg/dL — ABNORMAL HIGH (ref 0.0–1.0)
pH: 6 (ref 5.0–8.0)

## 2010-06-22 MED ORDER — IOHEXOL 300 MG/ML  SOLN
100.0000 mL | Freq: Once | INTRAMUSCULAR | Status: AC | PRN
  Administered 2010-06-22: 100 mL via INTRAVENOUS

## 2010-06-25 ENCOUNTER — Inpatient Hospital Stay (HOSPITAL_COMMUNITY)
Admission: EM | Admit: 2010-06-25 | Discharge: 2010-06-29 | DRG: 372 | Disposition: A | Discharge status: Home / Self Care | Payer: Medicaid Other | Attending: General Surgery | Admitting: General Surgery

## 2010-06-25 DIAGNOSIS — K630 Abscess of intestine: Principal | ICD-10-CM | POA: Diagnosis present

## 2010-06-25 DIAGNOSIS — F17200 Nicotine dependence, unspecified, uncomplicated: Secondary | ICD-10-CM | POA: Diagnosis present

## 2010-06-25 DIAGNOSIS — K5732 Diverticulitis of large intestine without perforation or abscess without bleeding: Secondary | ICD-10-CM | POA: Diagnosis present

## 2010-06-25 DIAGNOSIS — E0590 Thyrotoxicosis, unspecified without thyrotoxic crisis or storm: Secondary | ICD-10-CM | POA: Diagnosis present

## 2010-06-25 DIAGNOSIS — K219 Gastro-esophageal reflux disease without esophagitis: Secondary | ICD-10-CM | POA: Diagnosis present

## 2010-06-25 LAB — APTT: aPTT: 40 seconds — ABNORMAL HIGH (ref 24–37)

## 2010-06-25 LAB — URINE MICROSCOPIC-ADD ON

## 2010-06-25 LAB — CBC
MCHC: 32.3 g/dL (ref 30.0–36.0)
RDW: 14.5 % (ref 11.5–15.5)

## 2010-06-25 LAB — DIFFERENTIAL
Basophils Absolute: 0 10*3/uL (ref 0.0–0.1)
Eosinophils Absolute: 0 10*3/uL (ref 0.0–0.7)
Lymphs Abs: 1.6 10*3/uL (ref 0.7–4.0)
Monocytes Absolute: 1.1 10*3/uL — ABNORMAL HIGH (ref 0.1–1.0)
Neutro Abs: 13 10*3/uL — ABNORMAL HIGH (ref 1.7–7.7)

## 2010-06-25 LAB — COMPREHENSIVE METABOLIC PANEL
ALT: 17 U/L (ref 0–35)
AST: 14 U/L (ref 0–37)
Calcium: 8.8 mg/dL (ref 8.4–10.5)
Creatinine, Ser: 0.4 mg/dL (ref 0.4–1.2)
GFR calc Af Amer: >60 mL/min (ref >60)
Sodium: 132 mEq/L — ABNORMAL LOW (ref 135–145)
Total Protein: 6.9 g/dL (ref 6.0–8.3)

## 2010-06-25 LAB — PROTIME-INR: Prothrombin Time: 15.6 seconds — ABNORMAL HIGH (ref 11.6–15.2)

## 2010-06-25 LAB — URINALYSIS, ROUTINE W REFLEX MICROSCOPIC
Glucose, UA: NEGATIVE mg/dL
Hgb urine dipstick: NEGATIVE
Protein, ur: 30 mg/dL — AB
Specific Gravity, Urine: 1.028 (ref 1.005–1.030)

## 2010-06-26 ENCOUNTER — Inpatient Hospital Stay (HOSPITAL_COMMUNITY): Payer: Medicaid Other

## 2010-06-26 LAB — POCT URINALYSIS DIP (DEVICE)
Bilirubin Urine: NEGATIVE
Bilirubin Urine: NEGATIVE
Glucose, UA: NEGATIVE mg/dL
Hgb urine dipstick: NEGATIVE
Ketones, ur: NEGATIVE mg/dL
Ketones, ur: NEGATIVE mg/dL
Protein, ur: 30 mg/dL — AB
Specific Gravity, Urine: 1.02 (ref 1.005–1.030)
Specific Gravity, Urine: 1.025 (ref 1.005–1.030)
Urobilinogen, UA: 1 mg/dL (ref 0.0–1.0)

## 2010-06-26 LAB — CBC
MCV: 82.2 fL (ref 78.0–100.0)
Platelets: 323 10*3/uL (ref 150–400)
RBC: 3.82 MIL/uL — ABNORMAL LOW (ref 3.87–5.11)
WBC: 13.1 10*3/uL — ABNORMAL HIGH (ref 4.0–10.5)

## 2010-06-27 LAB — CBC
HCT: 31.5 % — ABNORMAL LOW (ref 36.0–46.0)
Hemoglobin: 9.7 g/dL — ABNORMAL LOW (ref 12.0–15.0)
MCH: 25.1 pg — ABNORMAL LOW (ref 26.0–34.0)
MCV: 81.4 fL (ref 78.0–100.0)
RBC: 3.87 MIL/uL (ref 3.87–5.11)
WBC: 11.4 10*3/uL — ABNORMAL HIGH (ref 4.0–10.5)

## 2010-06-28 LAB — CBC
HCT: 31.6 % — ABNORMAL LOW (ref 36.0–46.0)
Hemoglobin: 9.9 g/dL — ABNORMAL LOW (ref 12.0–15.0)
MCHC: 31.3 g/dL (ref 30.0–36.0)
MCV: 81.2 fL (ref 78.0–100.0)
RDW: 14.7 % (ref 11.5–15.5)

## 2010-06-28 LAB — DIFFERENTIAL
Basophils Relative: 0 % (ref 0–1)
Eosinophils Relative: 5 % (ref 0–5)
Lymphs Abs: 2.5 10*3/uL (ref 0.7–4.0)
Monocytes Absolute: 0.6 10*3/uL (ref 0.1–1.0)
Monocytes Relative: 9 % (ref 3–12)
Neutro Abs: 3.3 10*3/uL (ref 1.7–7.7)

## 2010-06-28 LAB — BASIC METABOLIC PANEL
BUN: 8 mg/dL (ref 6–23)
CO2: 27 mEq/L (ref 19–32)
Calcium: 8.3 mg/dL — ABNORMAL LOW (ref 8.4–10.5)
GFR calc non Af Amer: >60 mL/min (ref >60)
Glucose, Bld: 110 mg/dL — ABNORMAL HIGH (ref 70–99)
Sodium: 137 mEq/L (ref 135–145)

## 2010-06-28 NOTE — H&P (Signed)
Joanne, Koch               ACCOUNT NO.:  [id number]  MEDICAL RECORD NO.:  [id number]           PATIENT TYPE:  E  LOCATION:  WLED                         FACILITY:  [facility]  PHYSICIAN:  Sharlet Salina T. Charlotte Brafford, M.D.DATE OF BIRTH:  [birth year]  DATE OF ADMISSION:  06/25/2010 DATE OF DISCHARGE:                             HISTORY & PHYSICAL   CHIEF COMPLAINT:  Fever and abdominal pain.  HISTORY OF PRESENT ILLNESS:  [name] is a 35 year old female who at the end of January developed progressive abdominal pain.  He was then hospitalized from February 2 to February 5 of this year.  At that time, CT scan showed a large pelvic abscess and findings consistent with acute diverticulitis and diverticular abscess.  She underwent percutaneous drainage with IV antibiotics and quickly improved.  She was discharged home on oral antibiotics.  Her antibiotics were discontinued and her drain was removed 3 weeks ago at which time she was doing well.  Plans were made for colonoscopy and possible surgery.  The patient, however, about 5 days ago developed recurrent fever and lower abdominal pain.  At that time, she was evaluated by Dr. Corliss Skains in the office.  CT scan of the abdomen and pelvis was obtained as well as CBC.  She apparently had a high white count and was started on oral Cipro and Flagyl.  CT scan has shown a smaller but recurrent pelvic abscess of about 3 cm.  This was done 3 days ago.  On oral antibiotics, the patient seemed to improve initially with decreased temperature but she called this morning stating her fever was up to 104 last night with shaking chills.  She continues to have lower abdominal pain which is somewhat intermittent, much worse with bowel movements.  Her pain has not been worsening over the last several days.  She is having bowel movements.  No nausea or vomiting.  I asked the patient to come to the emergency room today for evaluation after she called.  PAST  MEDICAL HISTORY:  She has not had any previous surgery.  Medically she is followed for hyperthyroidism, asthma, reflux.  She has history of seizures as a child.  She has known gallstones on her imaging studies which have apparently not been symptomatic.  CURRENT MEDICATIONS:  Nexium 40 mg daily, propranolol 40 mg b.i.d., propylthiouracil 100 mg t.i.d., albuterol inhaler p.r.n.  She has also been on oral Cipro and Flagyl for 4 days.  ALLERGIES:  She is allergic to CODEINE, PENICILLIN, SULFA  SOCIAL HISTORY:  She is single.  She has three children at home.  She smokes 1/2 pack of cigarettes per day.  Does not drink alcohol.  FAMILY HISTORY:  Noncontributory.  REVIEW OF SYSTEMS:  GENERAL:  Positive for fever, chills, and malaise. HEENT:  Denies vision, hearing, or swallowing problems.  RESPIRATORY: Occasional mild wheezing, nothing current.  CARDIAC:  No chest pain, palpitations, history of heart disease.  ABDOMEN/GI:  As above.  GU:  No urinary burning or frequency.  PHYSICAL EXAMINATION:  VITAL SIGNS:  In the emergency room, she is afebrile, heart rate is 123, blood pressure of 112/70, respirations  of 18. GENERAL:  In general, she is a well-developed female who appears uncomfortable. SKIN:  There is minimal area of punctate rash in her mid abdomen above the umbilicus.  She states it been present since starting antibiotics, otherwise negative. HEENT:  No palpable mass or thyromegaly.  Mucous membranes are dry and tongue is coated.  Sclerae nonicteric. LYMPH NODES:  No cervical, supraclavicular, or inguinal nodes palpable. LUNGS:  Clear without wheezing or increased work of breathing. CARDIAC:  Regular.  Tachycardia.  No murmurs.  Peripheral pulses intact. No JVD, no erythema. ABDOMEN:  There is tenderness mostly localized to the left lower quadrant with some guarding.  No peritoneal signs.  The remainder of her abdomen is mildly tender.  No distention.  No palpable masses  or organomegaly.  No hernias. EXTREMITIES:  No joint swelling or deformity. NEUROLOGIC:  She is alert and fully oriented.  Motor and sensory exams grossly normal.  LABORATORY DATA:  White count is elevated at 15.7, hemoglobin 12.3. Urinalysis negative.  Remainder of laboratory pending.  ASSESSMENT AND PLAN:  This is a 35 year old female with recent history of diverticular abscess treated with percutaneous drainage.  She now has documented recurrent pelvic abscess and on outpatient therapy has continued high fever and persistent abdominal pain.  The patient will be admitted.  We will start IV antibiotics.  We will ask Interventional Radiology to review to see if another percutaneous drain could be place in an effort to get her through this episode without requiring emergency surgery.  She understands emergency surgery is required and she may well need a temporary colostomy.     Lorne Skeens. Zayana Salvador, M.D.     Tory Emerald  D:  06/25/2010  T:  06/25/2010  Job:  [id number]  Electronically Signed by Glenna Fellows M.D. on 06/28/2010 11:50:58 AM

## 2010-06-29 ENCOUNTER — Inpatient Hospital Stay (HOSPITAL_COMMUNITY): Payer: Medicaid Other

## 2010-06-29 MED ORDER — IOHEXOL 300 MG/ML  SOLN
100.0000 mL | Freq: Once | INTRAMUSCULAR | Status: AC | PRN
  Administered 2010-06-29: 100 mL via INTRAVENOUS

## 2010-07-06 ENCOUNTER — Ambulatory Visit: Payer: Medicaid Other | Admitting: Obstetrics and Gynecology

## 2010-07-06 ENCOUNTER — Ambulatory Visit (INDEPENDENT_AMBULATORY_CARE_PROVIDER_SITE_OTHER): Payer: Medicaid Other | Admitting: Obstetrics and Gynecology

## 2010-07-06 DIAGNOSIS — Z3049 Encounter for surveillance of other contraceptives: Secondary | ICD-10-CM

## 2010-07-07 NOTE — Progress Notes (Unsigned)
[name], [name]               ACCOUNT NO.:  [id number]  MEDICAL RECORD NO.:  [id number]           PATIENT TYPE:  A  LOCATION:  [facility]                   FACILITY:  [facility]  PHYSICIAN:  Argentina Donovan, MD        DATE OF BIRTH:  [birth year]  DATE OF SERVICE:  07/06/2010                                 CLINIC NOTE  The patient is a 35 year old Caucasian female, gravida 102, para 06-02-08-3 who came in for her first OB visit in April 27, 2010, and was on propylthiouracil for hyperthyroidism, which she had been on for sometime.  She had a long history of many miscarriages spontaneous in between the births of her successful babies and her visit in April 27, 2010, when she was in, all seemed to be doing well.  She came in later that night in labor.  They tried for 2 days to stop her labor and she spontaneously delivered twins, both born alive, but died within just a short period of time, a [redacted] weeks pregnant.  She was in the hospital about 3 days and was sent home on no medication.  She began vomiting and having terrible abdominal pain.  At that point, she stayed at home for several days eventually went into [facility] where they found that she had pelvic abscess secondary to apparent diverticulitis. She stated that [facility] drain was placed by Radiology into the abscesses and that they were drained.  She received antibiotics while in the hospital, but there was discharged nothing.  Several days later, she began having a low-grade fever, she called the surgeon who called in antibiotics for her.  Her temperature then went up to 103.  She went back into the hospital, was treated with IV therapy and had followup CT scans.  It showed a shrinking abscess.  She is scheduled to go back shortly to see the surgeon.  She also has an appointment to see her primary care doctor who is referring her to an endocrinologist because of the continued difficulty in handling the hyperthyroidism.   It sounds as if she is going to end up needing radioactive iodine therapy.  Also, she has an appointment for followup with counselor because she has episodes where she is getting quite depressed.  She desires Depo-Provera for contraception.  I tried to talk her out of it because I am afraid that it is going to increase her depression.  I warned her about that, but she says she does not do well on the pills, so we are going to give her shot.  We have discussed the fact that if she starts getting under control she will quickly go, and also talked about starting her on antidepressant, so this gets time to start working, and we started a simple Prozac for 20 mg a day and see if that may help her.  She has many problems, hypothyroidism, habitual abortion.  She is eventually going to need surgery for her diverticulitis.  She also has gallstones which need to probably come out eventually.  Plan is to give her Depo- Provera, Prozac, refer back to the general surgeon, her primary  care doctor.          ______________________________ Argentina Donovan, MD    PR/MEDQ  D:  07/06/2010  T:  07/07/2010  Job:  [id number]

## 2010-07-11 LAB — URINALYSIS, ROUTINE W REFLEX MICROSCOPIC
Glucose, UA: NEGATIVE mg/dL
Hgb urine dipstick: NEGATIVE
Protein, ur: NEGATIVE mg/dL
Specific Gravity, Urine: 1.026 (ref 1.005–1.030)
pH: 5.5 (ref 5.0–8.0)

## 2010-07-11 LAB — DIFFERENTIAL
Lymphocytes Relative: 29 % (ref 12–46)
Lymphs Abs: 2.7 10*3/uL (ref 0.7–4.0)
Monocytes Absolute: 0.6 10*3/uL (ref 0.1–1.0)
Monocytes Relative: 7 % (ref 3–12)
Neutro Abs: 5.7 10*3/uL (ref 1.7–7.7)
Neutrophils Relative %: 60 % (ref 43–77)

## 2010-07-11 LAB — COMPREHENSIVE METABOLIC PANEL
Albumin: 3.7 g/dL (ref 3.5–5.2)
BUN: 9 mg/dL (ref 6–23)
Calcium: 9.9 mg/dL (ref 8.4–10.5)
Glucose, Bld: 108 mg/dL — ABNORMAL HIGH (ref 70–99)
Potassium: 4.2 mEq/L (ref 3.5–5.1)
Total Protein: 6.9 g/dL (ref 6.0–8.3)

## 2010-07-11 LAB — POCT PREGNANCY, URINE: Preg Test, Ur: NEGATIVE

## 2010-07-11 LAB — CBC
HCT: 44.3 % (ref 36.0–46.0)
Hemoglobin: 14.4 g/dL (ref 12.0–15.0)
MCHC: 32.4 g/dL (ref 30.0–36.0)
Platelets: 280 10*3/uL (ref 150–400)
RDW: 13.8 % (ref 11.5–15.5)

## 2010-07-12 LAB — URINE CULTURE: Colony Count: >=100000

## 2010-07-12 LAB — CBC
Hemoglobin: 11 g/dL — ABNORMAL LOW (ref 12.0–15.0)
MCHC: 34.5 g/dL (ref 30.0–36.0)
Platelets: 70 10*3/uL — ABNORMAL LOW (ref 150–400)
Platelets: 98 10*3/uL — ABNORMAL LOW (ref 150–400)
RDW: 13.5 % (ref 11.5–15.5)
RDW: 13 % (ref 11.5–15.5)
WBC: 9.3 10*3/uL (ref 4.0–10.5)

## 2010-07-12 LAB — COMPREHENSIVE METABOLIC PANEL
ALT: 41 U/L — ABNORMAL HIGH (ref 0–35)
AST: 89 U/L — ABNORMAL HIGH (ref 0–37)
Albumin: 3 g/dL — ABNORMAL LOW (ref 3.5–5.2)
Alkaline Phosphatase: 77 U/L (ref 39–117)
Chloride: 103 mEq/L (ref 96–112)
GFR calc Af Amer: 59 mL/min — ABNORMAL LOW (ref >60)
Potassium: 3.2 mEq/L — ABNORMAL LOW (ref 3.5–5.1)
Sodium: 136 mEq/L (ref 135–145)
Total Bilirubin: 1.6 mg/dL — ABNORMAL HIGH (ref 0.3–1.2)
Total Protein: 5.6 g/dL — ABNORMAL LOW (ref 6.0–8.3)

## 2010-07-12 LAB — PROTIME-INR: INR: 1.4 (ref 0.00–1.49)

## 2010-07-12 LAB — T3, FREE: T3, Free: 3.7 pg/mL (ref 2.3–4.2)

## 2010-07-12 LAB — GC/CHLAMYDIA PROBE AMP, URINE
Chlamydia, Swab/Urine, PCR: NEGATIVE
GC Probe Amp, Urine: NEGATIVE

## 2010-07-12 LAB — URINALYSIS, ROUTINE W REFLEX MICROSCOPIC
Glucose, UA: NEGATIVE mg/dL
Ketones, ur: 15 mg/dL — AB
Specific Gravity, Urine: 1.015 (ref 1.005–1.030)
pH: 5.5 (ref 5.0–8.0)

## 2010-07-12 LAB — CULTURE, BLOOD (ROUTINE X 2)

## 2010-07-12 LAB — DIFFERENTIAL
Basophils Relative: 0 % (ref 0–1)
Eosinophils Absolute: 0 10*3/uL (ref 0.0–0.7)
Eosinophils Relative: 0 % (ref 0–5)
Monocytes Absolute: 0.2 10*3/uL (ref 0.1–1.0)
Monocytes Relative: 2 % — ABNORMAL LOW (ref 3–12)
Neutro Abs: 8.8 10*3/uL — ABNORMAL HIGH (ref 1.7–7.7)

## 2010-07-12 LAB — CROSSMATCH
ABO/RH(D): AB POS
Antibody Screen: NEGATIVE

## 2010-07-12 LAB — LACTIC ACID, PLASMA: Lactic Acid, Venous: 2.6 mmol/L — ABNORMAL HIGH (ref 0.5–2.2)

## 2010-07-12 LAB — URINE MICROSCOPIC-ADD ON

## 2010-07-13 LAB — CBC
HCT: 38.4 % (ref 36.0–46.0)
Hemoglobin: 13 g/dL (ref 12.0–15.0)
RBC: 4.43 MIL/uL (ref 3.87–5.11)
RDW: 13.1 % (ref 11.5–15.5)

## 2010-07-13 LAB — SAMPLE TO BLOOD BANK

## 2010-07-14 LAB — CBC
HCT: 39.5 % (ref 36.0–46.0)
Hemoglobin: 13.3 g/dL (ref 12.0–15.0)
MCHC: 33.6 g/dL (ref 30.0–36.0)
RBC: 4.52 MIL/uL (ref 3.87–5.11)
RDW: 13.8 % (ref 11.5–15.5)

## 2010-07-14 LAB — GC/CHLAMYDIA PROBE AMP, GENITAL
Chlamydia, DNA Probe: NEGATIVE
GC Probe Amp, Genital: NEGATIVE

## 2010-07-14 LAB — URINALYSIS, ROUTINE W REFLEX MICROSCOPIC
Nitrite: NEGATIVE
Specific Gravity, Urine: 1.025 (ref 1.005–1.030)
Urobilinogen, UA: 1 mg/dL (ref 0.0–1.0)
pH: 5.5 (ref 5.0–8.0)

## 2010-07-14 LAB — WET PREP, GENITAL: Yeast Wet Prep HPF POC: NONE SEEN

## 2010-07-14 LAB — POCT PREGNANCY, URINE: Preg Test, Ur: POSITIVE

## 2010-07-19 ENCOUNTER — Encounter: Payer: Self-pay | Admitting: Gastroenterology

## 2010-07-19 ENCOUNTER — Ambulatory Visit (INDEPENDENT_AMBULATORY_CARE_PROVIDER_SITE_OTHER): Payer: Medicaid Other | Admitting: Gastroenterology

## 2010-07-19 ENCOUNTER — Other Ambulatory Visit (INDEPENDENT_AMBULATORY_CARE_PROVIDER_SITE_OTHER): Payer: Medicaid Other

## 2010-07-19 DIAGNOSIS — J4489 Other specified chronic obstructive pulmonary disease: Secondary | ICD-10-CM

## 2010-07-19 DIAGNOSIS — R1032 Left lower quadrant pain: Secondary | ICD-10-CM

## 2010-07-19 DIAGNOSIS — K625 Hemorrhage of anus and rectum: Secondary | ICD-10-CM

## 2010-07-19 DIAGNOSIS — J449 Chronic obstructive pulmonary disease, unspecified: Secondary | ICD-10-CM

## 2010-07-19 DIAGNOSIS — O8689 Other specified puerperal infections: Secondary | ICD-10-CM

## 2010-07-19 DIAGNOSIS — K5730 Diverticulosis of large intestine without perforation or abscess without bleeding: Secondary | ICD-10-CM

## 2010-07-19 DIAGNOSIS — E0590 Thyrotoxicosis, unspecified without thyrotoxic crisis or storm: Secondary | ICD-10-CM

## 2010-07-19 LAB — HEPATIC FUNCTION PANEL
AST: 16 U/L (ref 0–37)
Total Bilirubin: 0.4 mg/dL (ref 0.3–1.2)
Total Protein: 6.7 g/dL (ref 6.0–8.3)

## 2010-07-19 LAB — VITAMIN B12: Vitamin B-12: 271 pg/mL (ref 211–911)

## 2010-07-19 LAB — BASIC METABOLIC PANEL
BUN: 8 mg/dL (ref 6–23)
CO2: 26 mEq/L (ref 19–32)
Chloride: 107 mEq/L (ref 96–112)
Creatinine, Ser: 0.5 mg/dL (ref 0.4–1.2)

## 2010-07-19 LAB — CBC WITH DIFFERENTIAL/PLATELET
Basophils Relative: 1.1 % (ref 0.0–3.0)
HCT: 35.9 % — ABNORMAL LOW (ref 36.0–46.0)
Hemoglobin: 12.1 g/dL (ref 12.0–15.0)
Lymphocytes Relative: 36.5 % (ref 12.0–46.0)
Lymphs Abs: 2.5 10*3/uL (ref 0.7–4.0)
MCHC: 33.6 g/dL (ref 30.0–36.0)
Monocytes Relative: 6.6 % (ref 3.0–12.0)
Neutro Abs: 3.6 10*3/uL (ref 1.4–7.7)
RBC: 4.46 Mil/uL (ref 3.87–5.11)

## 2010-07-19 LAB — IBC PANEL: Transferrin: 252.4 mg/dL (ref 212.0–360.0)

## 2010-07-19 MED ORDER — HYOSCYAMINE SULFATE 0.125 MG SL SUBL: 0.1250 mg | SUBLINGUAL_TABLET | SUBLINGUAL | Status: AC | PRN

## 2010-07-19 MED ORDER — PEG-KCL-NACL-NASULF-NA ASC-C 100 G PO SOLR: 1.0000 | Freq: Once | ORAL | Status: AC

## 2010-07-19 MED ORDER — MESALAMINE 1.2 G PO TBEC: 1200.0000 mg | DELAYED_RELEASE_TABLET | Freq: Two times a day (BID) | ORAL | Status: DC

## 2010-07-19 NOTE — Progress Notes (Signed)
History of Present Illness:  This is a very complicated 35-year-old Caucasian female with hyperthyroidism recurrent spontaneous abortions over the last 6 months has had recurrent pelvic abscesses doubt related to diverticulitis. Prolonged catheter drainage of one abscess but had a recurrence in March of this year that required repeat hospitalization repeat intravenous antibiotics. She currently has vague lower abdominal discomfort, has had a new-onset rectal bleeding over the last 2-3 days of small amounts of fresh blood. She has a long history of chronic constipation with gas and bloating. Had several CT scans that were reviewed they do show diverticulitis and recurrent abscesses. Denies upper gastrointestinal, hepatobiliary, or systemic complaints at this time. She has taken Levaquin 750 mg a day, a broad of inhalers for COPD associated with smoking, rosette 20 mg a day for depression, Inderal and PTU for hyperthyroidism, when necessary hydrocodone he currently denies fever, chills, nausea vomiting, skin rashes, joint pains, or stomatitis. Family history is noncontributory.  I have reviewed this patient's present history, medical and surgical past history, allergies and medications.    Past Medical History  Diagnosis Date  . Hyperthyroidism   . Asthma   . Gallstones   . Diverticulosis of colon (without mention of hemorrhage)   . Esophageal reflux   . Urinary tract infection   . Depression   . Anxiety   . Chronic headaches    Past Surgical History  Procedure Date  . Dilation and curettage of uterus   . Johnsten drain   . Orif acetabular fracture     reports that she has been smoking Cigarettes.  She has been smoking about 1 pack per day. She has never used smokeless tobacco. She reports that she does not drink alcohol or use illicit drugs. family history includes Breast cancer in her maternal grandmother; COPD in her father; Cervical cancer in her mother; Colon cancer in her father; Colon  polyps in her father; Diabetes in her father; Kidney disease in her mother; and Lung cancer in her brother. Allergies  Allergen Reactions  . Codeine   . Penicillins   . Sulfa Antibiotics       ROS: The remainder of the 10 point ROS is negative... she has not had a menstrual period in several months. She denies any chance of being pregnant. No history of current cardiovascular problems but she does have COPD and dyspnea on exertion. No history of purulent sputum or hemoptysis.   Past Medical History  Diagnosis Date  . Hyperthyroidism   . Asthma   . Gallstones   . Diverticulosis of colon (without mention of hemorrhage)   . Esophageal reflux   . Urinary tract infection   . Depression   . Anxiety   . Chronic headaches    Past Surgical History  Procedure Date  . Dilation and curettage of uterus   . Johnsten drain   . Orif acetabular fracture     reports that she has been smoking Cigarettes.  She has been smoking about 1 pack per day. She has never used smokeless tobacco. She reports that she does not drink alcohol or use illicit drugs. family history includes Breast cancer in her maternal grandmother; COPD in her father; Cervical cancer in her mother; Colon cancer in her father; Colon polyps in her father; Diabetes in her father; Kidney disease in her mother; and Lung cancer in her brother. Allergies  Allergen Reactions  . Codeine   . Penicillins   . Sulfa Antibiotics        Physical  Exam: General well developed well nourished patient in no acute distress, appearing their stated age Eyes PERRLA, no icterus, fundoscopic exam per opthamologist. Obvious proptosis noted Skin no lesions noted Neck supple, no adenopathy, no thyroid enlargement, no tenderness Chest clear to percussion and auscultation. Scattered rhonchi bilaterally noted. Heart no significant murmurs, gallops or rubs noted Abdomen no hepatosplenomegaly masses or tenderness, BS normal. Cannot appreciate any lower  bowel no mass or significant tenderness at this time. Bowel sounds are normal.  Rectal inspection normal no fissures, or fistulae noted.  No masses or tenderness on digital exam. Dark stool which is markedly guaiac positive noted but no blood clots, masses or tenderness noted. Extremities no acute joint lesions, edema, phlebitis or evidence of cellulitis. Neurologic patient oriented x 3, cranial nerves intact, no focal neurologic deficits noted. Psychological mental status normal and normal affect.  Assessment and plan: Probable segmental colitis associated with severe diverticulitis and apparently healing pelvic abscess that has been present since February. No history of previous colonoscopy or barium enemas. Cannot appreciate any tenderness or abdominal mass at this time. Other considerations would be C. difficile colitis. I have ordered stool C. difficile toxin by PCR, repeat labs, and we will do colonoscopy with propofol sedation. I placed her on Lialda 2.4 g daily in the interim. I agree with Dr. Corliss Skains at [facility] that the patient will probably need sigmoid resection. Has appointment for primary care followup in endocrinology evaluation on March 20.  Please copy her primary care physician, referring physician, and pertinent subspecialists.

## 2010-07-19 NOTE — Patient Instructions (Addendum)
Your prescription(s) have been sent to you pharmacy.  Your procedure has been scheduled for 07/25/2010, please follow the seperate instructions.  Your prescription(s) have been sent to you pharmacy.  Take the Lialda samples one tablet twice a day and if an rx is needed it will be given after colonoscopy.

## 2010-07-20 ENCOUNTER — Telehealth: Payer: Self-pay | Admitting: Gastroenterology

## 2010-07-20 NOTE — Telephone Encounter (Signed)
Patient aware she needs to come back for container for cdiff.

## 2010-07-21 ENCOUNTER — Encounter: Payer: Self-pay | Admitting: Gastroenterology

## 2010-07-25 ENCOUNTER — Encounter: Payer: Self-pay | Admitting: Gastroenterology

## 2010-07-25 ENCOUNTER — Ambulatory Visit (AMBULATORY_SURGERY_CENTER): Payer: Medicaid Other | Admitting: Gastroenterology

## 2010-07-25 DIAGNOSIS — R109 Unspecified abdominal pain: Secondary | ICD-10-CM | POA: Insufficient documentation

## 2010-07-25 DIAGNOSIS — C189 Malignant neoplasm of colon, unspecified: Secondary | ICD-10-CM

## 2010-07-25 DIAGNOSIS — K56699 Other intestinal obstruction unspecified as to partial versus complete obstruction: Secondary | ICD-10-CM

## 2010-07-25 DIAGNOSIS — K625 Hemorrhage of anus and rectum: Secondary | ICD-10-CM

## 2010-07-25 DIAGNOSIS — K639 Disease of intestine, unspecified: Secondary | ICD-10-CM

## 2010-07-25 DIAGNOSIS — K6389 Other specified diseases of intestine: Secondary | ICD-10-CM

## 2010-07-25 DIAGNOSIS — C187 Malignant neoplasm of sigmoid colon: Secondary | ICD-10-CM

## 2010-07-25 HISTORY — DX: Malignant neoplasm of colon, unspecified: C18.9

## 2010-07-25 MED ORDER — SODIUM CHLORIDE 0.9 % IV SOLN: 500.0000 mL | INTRAVENOUS | Status: DC

## 2010-07-25 NOTE — Progress Notes (Signed)
Patient requested to speak with Dr. Jarold Motto without her caregiver who is a 35 yr old friend. Caregiver not in patients room at present.

## 2010-07-26 ENCOUNTER — Telehealth: Payer: Self-pay | Admitting: *Deleted

## 2010-07-26 NOTE — Telephone Encounter (Signed)

## 2010-07-26 NOTE — Telephone Encounter (Signed)
Patient breaking up;  Stated to call her back later.

## 2010-07-27 ENCOUNTER — Encounter: Payer: Self-pay | Admitting: Gastroenterology

## 2010-07-27 ENCOUNTER — Telehealth: Payer: Self-pay | Admitting: Gastroenterology

## 2010-07-27 NOTE — Telephone Encounter (Signed)
I CALLED HER

## 2010-07-27 NOTE — Telephone Encounter (Signed)
Dr Jarold Motto, please advise. Thanks.

## 2010-08-02 NOTE — Discharge Summary (Signed)
[name], [name]               ACCOUNT NO.:  [id number]  MEDICAL RECORD NO.:  [id number]           PATIENT TYPE:  I  LOCATION:  1529                         FACILITY:  [facility]  PHYSICIAN:  Velora Heckler, MD      DATE OF BIRTH:  [birth year]  DATE OF ADMISSION:  06/25/2010 DATE OF DISCHARGE:  06/29/2010                        DISCHARGE SUMMARY - REFERRING   ADMISSION DIAGNOSES: 1. Recurrent diverticular abscess, on antibiotics. 2. Hyperthyroid, on beta-blockers and propylthiouracil. 3. Gastroesophageal reflux disease. 4. Tobacco use.  DISCHARGE DIAGNOSES: 1. Recurrent diverticular abscess, on antibiotics. 2. Hyperthyroid, on beta-blockers and propylthiouracil. 3. Gastroesophageal reflux disease. 4. Tobacco use.  PROCEDURES: 1. CT of the abdomen June 22, 2010, showing apparent reaccumulation     of the mid anterior pelvic abscess. 2. Repeat CT June 29, 2010, showing pelvic abscess with interval     decrease.  Main portion of the abscess measured 3 x 3 and now 2.3 x     1.8.  Elongated portion pointing towards the left ovary is also     smaller and there is less air.  Persistent moderate free pelvic     fluid, bilateral ovarian cysts.  BRIEF HISTORY:  The patient is a 35 year old white female who was seen on the day of admission by Dr. Jaclynn Guarneri.  She presented with progressive abdominal pain.  She previously has been hospitalized February 2 through February 5.  At that time, she had a large pelvic abscess consistent with acute diverticulitis and abscess.  She underwent percutaneous drainage along with IV antibiotics and quickly improved. She was discharged home on oral antibiotics.  The antibiotics were discontinued, her drain was removed 3 weeks ago.  She subsequently was scheduled for colonoscopy and possible surgery.  However, 5 days ago developed recurrent fever and  abdominal pain.  At that time, she was seen by Dr. Corliss Skains in the office and treated with Cipro and  Flagyl.  She returns now stating she had a fever up to 104 last night with shaking chills.  She was admitted by Dr. Johna Sheriff.  HOSPITAL COURSE:  The patient was admitted, placed on ticarcillin.  She showed good improvement with her white count going from 15.7 on 03/24, down to 13.1 and then 11.4, 03/25 and 03/26 respectively.  She was started on clear liquids and at this point has advanced.  Her fever is completely resolved.  Her labs showed that yesterday white count was down to 6.7.  BMP is normal with creatinine 0.65, a BUN of 8.  She is ambulating and anxious to go home.  She was seen by Dr. Gerrit Friends in the afternoon of 03/28 and it was his opinion, she could go home.  He discussed with pharmacy options since Flagyl and Cipro did not seem to work and she was converted to clindamycin 300 mg 1 p.o. q.i.d. for 10 days and Levaquin 750 mg daily.  The patient was instructed to make followup appointment in 1 week with Dr. Corliss Skains.  She is to resume activities as tolerated.  DISCHARGE MEDICATIONS: 1. Clindamycin 300 daily. 2. Recommend nicotine patch be continued and she discontinue smoking. 3.  Levaquin 750 mg daily. 4. She resume Motrin 200 mg p.r.n. for pain. 5. Nexium 40 mg daily. 6. Propranolol 40 mg b.i.d.  She was admonished to check further     primary care doctor if she felt that her blood pressure was     somewhat diminished and her propranolol was held a couple times     during her hospital stay here. 7. Propylthiouracil 50 mg 2 tablets t.i.d. 8. Tylenol p.r.n. 9. She can continue her Ventolin inhalers 2 puffs t.i.d. p.r.n. 10.Vicodin 5/500 p.r.n. q.4. 11.She was instructed to discontinue the Cipro and the Flagyl.  CONDITION ON DISCHARGE:  Improved.  Dictated For:  Velora Heckler, MD     Eber Hong, P.A.   ______________________________ Velora Heckler, MD    WDJ/MEDQ  D:  06/29/2010  T:  06/29/2010  Job:  [id number]  cc:   [facility] [place], [place]  Electronically Signed by Sherrie George P.A. on 07/28/2010 01:23:09 PM Electronically Signed by Darnell Level MD on 08/02/2010 11:37:31 AM

## 2010-08-03 ENCOUNTER — Encounter (HOSPITAL_COMMUNITY)
Admission: RE | Admit: 2010-08-03 | Discharge: 2010-08-03 | Disposition: A | Discharge status: Home / Self Care | Payer: Medicaid Other | Source: Ambulatory Visit | Attending: Surgery | Admitting: Surgery

## 2010-08-03 ENCOUNTER — Other Ambulatory Visit (HOSPITAL_COMMUNITY): Payer: Self-pay | Admitting: Surgery

## 2010-08-03 DIAGNOSIS — C189 Malignant neoplasm of colon, unspecified: Secondary | ICD-10-CM

## 2010-08-03 LAB — SURGICAL PCR SCREEN
MRSA, PCR: NEGATIVE
Staphylococcus aureus: NEGATIVE

## 2010-08-03 LAB — CBC
HCT: 37.6 % (ref 36.0–46.0)
MCV: 80.7 fL (ref 78.0–100.0)
Platelets: 355 10*3/uL (ref 150–400)
RBC: 4.66 MIL/uL (ref 3.87–5.11)
RDW: 16.3 % — ABNORMAL HIGH (ref 11.5–15.5)
WBC: 7.9 10*3/uL (ref 4.0–10.5)

## 2010-08-03 LAB — COMPREHENSIVE METABOLIC PANEL
ALT: 14 U/L (ref 0–35)
Albumin: 3.6 g/dL (ref 3.5–5.2)
Alkaline Phosphatase: 85 U/L (ref 39–117)
BUN: 10 mg/dL (ref 6–23)
Chloride: 103 mEq/L (ref 96–112)
Glucose, Bld: 104 mg/dL — ABNORMAL HIGH (ref 70–99)
Potassium: 4.2 mEq/L (ref 3.5–5.1)
Sodium: 136 mEq/L (ref 135–145)
Total Bilirubin: 0.4 mg/dL (ref 0.3–1.2)
Total Protein: 6.3 g/dL (ref 6.0–8.3)

## 2010-08-03 LAB — DIFFERENTIAL
Basophils Absolute: 0 10*3/uL (ref 0.0–0.1)
Eosinophils Relative: 4 % (ref 0–5)
Lymphocytes Relative: 34 % (ref 12–46)
Lymphs Abs: 2.7 10*3/uL (ref 0.7–4.0)
Neutrophils Relative %: 56 % (ref 43–77)

## 2010-08-05 ENCOUNTER — Inpatient Hospital Stay (HOSPITAL_COMMUNITY)
Admission: RE | Admit: 2010-08-05 | Discharge: 2010-08-11 | DRG: 331 | Disposition: A | Discharge status: Home Health | Payer: Medicaid Other | Source: Ambulatory Visit | Attending: Surgery | Admitting: Surgery

## 2010-08-05 ENCOUNTER — Other Ambulatory Visit: Payer: Self-pay | Admitting: Surgery

## 2010-08-05 DIAGNOSIS — K219 Gastro-esophageal reflux disease without esophagitis: Secondary | ICD-10-CM | POA: Diagnosis present

## 2010-08-05 DIAGNOSIS — E0590 Thyrotoxicosis, unspecified without thyrotoxic crisis or storm: Secondary | ICD-10-CM | POA: Diagnosis present

## 2010-08-05 DIAGNOSIS — J45909 Unspecified asthma, uncomplicated: Secondary | ICD-10-CM | POA: Diagnosis present

## 2010-08-05 DIAGNOSIS — C187 Malignant neoplasm of sigmoid colon: Principal | ICD-10-CM | POA: Diagnosis present

## 2010-08-05 DIAGNOSIS — Z01812 Encounter for preprocedural laboratory examination: Secondary | ICD-10-CM

## 2010-08-05 DIAGNOSIS — Z01818 Encounter for other preprocedural examination: Secondary | ICD-10-CM

## 2010-08-05 DIAGNOSIS — K8020 Calculus of gallbladder without cholecystitis without obstruction: Secondary | ICD-10-CM | POA: Diagnosis present

## 2010-08-05 HISTORY — PX: COLON SURGERY: SHX602

## 2010-08-05 LAB — HCG, SERUM, QUALITATIVE: Preg, Serum: NEGATIVE

## 2010-08-06 LAB — COMPREHENSIVE METABOLIC PANEL
ALT: 15 U/L (ref 0–35)
AST: 18 U/L (ref 0–37)
Albumin: 3 g/dL — ABNORMAL LOW (ref 3.5–5.2)
Alkaline Phosphatase: 69 U/L (ref 39–117)
BUN: 7 mg/dL (ref 6–23)
Chloride: 104 mEq/L (ref 96–112)
GFR calc Af Amer: >60 mL/min (ref >60)
Potassium: 4.2 mEq/L (ref 3.5–5.1)
Sodium: 137 mEq/L (ref 135–145)
Total Bilirubin: 0.6 mg/dL (ref 0.3–1.2)
Total Protein: 5.9 g/dL — ABNORMAL LOW (ref 6.0–8.3)

## 2010-08-06 LAB — CBC
Platelets: 333 10*3/uL (ref 150–400)
RBC: 4.24 MIL/uL (ref 3.87–5.11)
RDW: 16.1 % — ABNORMAL HIGH (ref 11.5–15.5)
WBC: 15.9 10*3/uL — ABNORMAL HIGH (ref 4.0–10.5)

## 2010-08-09 LAB — BASIC METABOLIC PANEL
BUN: 3 mg/dL — ABNORMAL LOW (ref 6–23)
Calcium: 9.2 mg/dL (ref 8.4–10.5)
Chloride: 99 mEq/L (ref 96–112)
Creatinine, Ser: <0.47 mg/dL (ref 0.4–1.2)

## 2010-08-09 LAB — CBC
MCH: 26.3 pg (ref 26.0–34.0)
MCHC: 32.8 g/dL (ref 30.0–36.0)
MCV: 80.2 fL (ref 78.0–100.0)
Platelets: 365 10*3/uL (ref 150–400)
RBC: 4.64 MIL/uL (ref 3.87–5.11)

## 2010-08-11 NOTE — Op Note (Signed)
[name], [name]               ACCOUNT NO.:  [id number]  MEDICAL RECORD NO.:  [id number]           PATIENT TYPE:  I  LOCATION:  5123                         FACILITY:  [facility]  PHYSICIAN:  Wilmon Arms. Corliss Skains, M.D. DATE OF BIRTH:  01/24/1976  DATE OF PROCEDURE:  08/05/2010 DATE OF DISCHARGE:                              OPERATIVE REPORT   PREOPERATIVE DIAGNOSES: 1. Sigmoid colon cancer. 2. Cholelithiasis.  POSTOPERATIVE DIAGNOSES: 1. Sigmoid colon cancer. 2. Cholelithiasis.  PROCEDURES: 1. Laparoscopic cholecystectomy. 2. Laparoscopic-assisted sigmoid colectomy. 3. Appendectomy. 4. Creation of a loop ileostomy. 5. Rigid sigmoidoscopy.  SURGEON:  Wilmon Arms. Corliss Skains, MD  ASSISTANT:  Adolph Pollack, MD  ANESTHESIA:  General endotracheal.  INDICATIONS:  This is a 35 year old female who was recently hospitalized with a pelvic abscess.  It was felt that this likely represented a diverticular abscess and this was percutaneously drained.  She recovered and as part of her further workup she underwent a colonoscopy.  This showed a near obstructing colon mass in the sigmoid colon.  This was biopsied and was confirmed to be adenocarcinoma.  She presents now for colon resection.  She also has known gallstones and the decision was made to proceed with cholecystectomy at this time.  DESCRIPTION OF PROCEDURE:  The patient was brought to the operating room and placed in the supine position on the operating table.  After an adequate level of general anesthesia was obtained, a Foley catheter was placed under sterile technique.  Her legs were placed in Yellofin stirrups in lithotomy position.  We performed a rigid sigmoidoscopy. She had some liquid stool in her rectum.  The tumor was at least 18 cm proximal to the anal verge.  We then prepped the perineum with Betadine and prepped her abdomen with ChloraPrep.  We draped in a sterile fashion.  We infiltrated the area below the  umbilicus with 0.25% Marcaine with epinephrine.  A vertical incision was made.  Dissection was carried down to the fascia.  We bluntly entered the peritoneal cavity and inserted a stay suture of 0 Vicryl.  We inserted the Hasson cannula and secured it with stay suture.  Pneumoperitoneum was obtained by insufflating CO2 maintaining maximal pressure of 15 mmHg.  The laparoscope was inserted.  The patient was positioned in reverse Trendelenburg and tilted to her left.  An 11-mm port was placed in the subxiphoid position.  Two 5-mm ports were placed in the right upper quadrant.  The gallbladder was grasped with clamp and lifted.  There were some adhesions to the gallbladder.  We opened the peritoneum around the hilum of gallbladder and dissected around the cystic duct and cystic artery.  We clearly saw the critical view with both structures clearly dissected.  We ligated the cystic duct with clips and divided.  We ligated the cystic artery with clips divided.  Cautery was then used to also dissect the gallbladder free from the liver.  The gallbladder was placed in an Endocatch sac and removed the umbilical port site.  We irrigated the right upper quadrant and hemostasis was good.  We then tilted the patient to Trendelenburg and tilted  her to her right.  There were some adhesions down towards the pelvis.  We visualized the sigmoid colon which seemed to be fairly soft and mobile proximally.  We divided the white line of Toldt with cautery along the left side.  We mobilized the colon medially.  However, as we looked down the pelvis, there was some small bowel adherent down into the pelvis.  This was felt to represent the old chronic abscess cavity.  We then removed the Hasson cannula and replaced this with a 7-cm incision and placed the GelPort device.  We inserted the hand.  We were able to bluntly dissect the left colon away from the lateral abdominal wall.  We mobilized the splenic flexure,  so there was plenty of mobility.  We were able to bluntly dissect the small bowel free from the chronic abscess cavity down in the pelvis.  We then released insufflation and removed the GelPort device. We extended our incision inferiorly towards the symphysis pubis.  This allowed [place] to adequately visualize the sigmoid colon.  We could clearly visualize the mass in the distal sigmoid colon.  We identified the left and right ureters and preserved these.  We picked an area about 10 cm proximal to the colon mass and divided this with a GIA 75 stapler.  We used the Enseal super jaw device to ligate the mesocolon.  The mesentery was rather foreshortened due to the inflammation.  We suture-ligated the inferior mesenteric artery and vein.  We continued dissecting down until we were below the mass by a distance of about 5-7 cm.  We divided the lateral attachments and divided the mesoappendix with the super jaw. Once we had clearly dissected down to the rectal wall, we divided this with a contour stapler blue load.  Specimens oriented with a suture proximally.  We passed this off the field.  We then thoroughly irrigated.  Hemostasis was good.  We retracted the uterus anteriorly. Both ovaries and tubes were identified and appeared normal.  We then again thoroughly irrigated.  We removed the proximal staple line and used EEA sizers.  The colon was fairly dilated.  We were able to pass the EEA sizers all the way up to 33 mm.  However, her rectum was not as large, so we chose a 29-mm EEA stapler.  We placed a 2-0 Prolene purse- string suture around the edge of the proximal colon.  We placed an anvil and secured this with the purse-string suture.  We cleared some of fat away from the end of the colon.  I then went below and dilated her rectum up to 3 fingers.  We inserted the 29-mm EEA stapler.  The spike was advanced just anterior to the staple line of the rectal stump.  This was connected to the anvil  and we created end-to-end anastomosis.  The stapler was removed.  The proximal donut was not intact.  The distal donut was sent for distal margin.  The distal donut was intact.  We then tested our anastomosis with the rigid sigmoidoscopy.  Air leak was noted.  This seemed to be coming from the left side of the anastomosis. We placed some additional sutures of 2-0 silk along the left side.  We retested and there still seemed to be a small leak.  This area had been located near the chronic abscess cavity.  Due to this chronic inflammation and the persistent leak, we made decision to divert her. We carefully examined the anastomosis and there were  no large holes noted.  There did seem to be a tiny leak.  We then thoroughly irrigated. We brought a 19-French drain through a small stab incision in the left lower quadrant and placed this down in the pelvis near the anastomosis. We made decision to proceed with appendectomy as the appendix was fairly long and was located next to the anastomosis.  We ligated the mesoappendix with the super jaw device.  We divided the base of the appendix with a GIA 75 stapler.  Hemostasis was good.  We selected a loop of terminal ileum about 20 cm proximal to the ileocecal valve.  We brought this up as a loop through a small opening in the fascia and skin.  We then placed a 16-French red rubber catheter as a bar under her loop ileostomy.  We then irrigated, removed all the sponges, and closed fashion with double-stranded #1 PDS.  Staples were used to close the skin.  We covered the two lateral 5-mm port sites with Dermabond as they were near our ileostomy.  The ileostomy was then matured with 3-0 Vicryl.  The red rubber catheter bar was secured with 2-0 nylon.  The drain was secured with 2-0 nylon.  An ostomy appliance was cut to fit.  Clean dressing was applied. The patient was extubated and brought to the recovery room in stable condition.  All sponge,  instrument, and needle counts were correct.     Wilmon Arms. Corliss Skains, M.D.     MKT/MEDQ  D:  08/05/2010  T:  08/06/2010  Job:  [id number]  Electronically Signed by Manus Rudd M.D. on 08/11/2010 08:20:58 AM

## 2010-08-12 NOTE — Discharge Summary (Signed)
  [name], [name]               ACCOUNT NO.:  [id number]  MEDICAL RECORD NO.:  [id number]           PATIENT TYPE:  I  LOCATION:  5123                         FACILITY:  [facility]  PHYSICIAN:  Wilmon Arms. Corliss Skains, M.D. DATE OF BIRTH:  [birth year]  DATE OF ADMISSION:  08/05/2010 DATE OF DISCHARGE:  08/11/2010                              DISCHARGE SUMMARY   ADMISSION DIAGNOSES: 1. Sigmoid colon adenocarcinoma. 2. Cholelithiasis.  DISCHARGE DIAGNOSES: 1. Sigmoid colon adenocarcinoma. 2. Cholelithiasis.  SECONDARY DIAGNOSES: 1. Hyperthyroidism. 2. Asthma. 3. Gastroesophageal reflux disease.  BRIEF HISTORY:  This is a 35 year old female who was recently diagnosed with a large adenocarcinoma of the sigmoid colon.  She also has known cholelithiasis.  She presents now for resection of both of these.  HOSPITAL COURSE:  The patient was admitted to hospital on Aug 05, 2010. She underwent a laparoscopic cholecystectomy and a laparoscopic-assisted sigmoid colectomy.  She also had incidental appendectomy.  Due to the amount of inflammation in her pelvis from her previous pelvic abscess and a tiny air leak at her anastomosis, the decision was made a create a temporary loop ileostomy.  Postoperatively, the patient had a postoperative ileus for couple of days.  Her ostomy began functioning. Initially, there were some issues with pain control, but she is now on a pain regimen that is his providing adequate relief.  She has been advanced to a regular diet.  Her incision looks good.  Her drain has been removed as this is having minimal output.  DISCHARGE INSTRUCTIONS:  Home health nursing has been arranged with [facility] to assist with ostomy care and obtaining ostomy supplies.  She is given oxycodone 10 mg p.o. q.6 p.r.n. for pain.  She may stop her antibiotics.  She has a followup appointment 1 week for staple removal and removal of the bar.  She should refrain from any heavy  lifting.     Wilmon Arms. Corliss Skains, M.D.    MKT/MEDQ  D:  08/11/2010  T:  08/11/2010  Job:  [id number]  Electronically Signed by Manus Rudd M.D. on 08/12/2010 02:49:27 PM

## 2010-08-16 NOTE — Discharge Summary (Signed)
[name], [name]               ACCOUNT NO.:  [id number]   MEDICAL RECORD NO.:  [id number]          PATIENT TYPE:  INP   LOCATION:  9374                          FACILITY:  [facility]   PHYSICIAN:  Tanya S. Shawnie Pons, M.D.   DATE OF BIRTH:  [birth year]   DATE OF ADMISSION:  08/16/2008  DATE OF DISCHARGE:  08/17/2008                               DISCHARGE SUMMARY   FINAL DIAGNOSES:  1. Septic abortion.  2. Exophthalmos.  3. Asthma.  4. Allergic rhinitis.  5. History of abnormal Pap smear.   PROCEDURES:  The patient underwent a suction D and C during this  hospitalization.   PERTINENT LABORATORY DATA:  Initial hemoglobin of 12.8, postprocedure  hemoglobin 11.1, normal white count.   REASON FOR ADMISSION:  Briefly, the patient is a 35 year old, gravida  60, para 3, who has a history of multiple ABs who has a known 9-week  missed AB in March.  She was given a prescription for Cytotec at that  time but the patient failed to take.  The patient returned in April, got  Cytotec again, and states that she passed quite a bit of tissue at that  time.  She returned to see the GYN Clinic on Aug 06, 2008, was given  Rocephin and ultrasound that showed retained products of conception and  a consistently positive beta at 122 and was placed on Cipro.  She had no  followups since then.  She passed out at home while trying to move and  EMS was called.  She was initially taken to [facility] where  she was found to be febrile at 103 and hypotensive.  She had fluid  resuscitation, was transferred to [facility].   HOSPITAL COURSE:  The patient was admitted to telemetry.  She underwent  the above procedure without difficulty.  IV fluid resuscitation,  imipenem, and gentamicin were used for this patient.  Postoperatively,  she was transferred to the unit where she actually did fairly well.  She  remained afebrile for 24 hours and felt much improved.  Her appetite was  back.  She was ambulating and voiding  without difficulty.  Given all  this and the fact that it was felt she was cared from her septic AB by  emptying her uterus, it was felt she is stable for discharge.   DISCHARGE DISPOSITION AND CONDITION:  The patient is discharged home in  good condition.   Discharge medications include:  1. Cipro 500 mg p.o. b.i.d. for the next 7 days.  2. Flagyl 500 mg p.o. b.i.d. for the next 7 days.  3. Ibuprofen 600 mg p.o. q.6 hours p.r.n. pain.   The patient will continue her home medications to include Tylenol as  needed, albuterol inhaler as needed, Zyrtec as needed, Phenergan as  needed.   She will follow up in GYN Clinic on September 03, 2008, at 3 o'clock.  This  appointment has been made prior to her discharge.  Additionally, the  patient has exophthalmos which was noted on postop day #1 and so a TSH,  free T3, free T4 were added  to her labs just to be sure she did not have  any thyroid storm issues going on.  She was instructed to ask about  these results on return, and I will try to follow up with them on  tomorrow.       Shelbie Proctor. Shawnie Pons, M.D.  Electronically Signed     TSP/MEDQ  D:  08/17/2008  T:  08/17/2008  Job:  [id number]

## 2010-08-16 NOTE — Op Note (Signed)
NAMELEAHNA, HEWSON               ACCOUNT NO.:  [id number]   MEDICAL RECORD NO.:  [id number]          PATIENT TYPE:  INP   LOCATION:  9374                          FACILITY:  [facility]   PHYSICIAN:  Tanya S. Shawnie Pons, M.D.   DATE OF BIRTH:  11-16-1975   DATE OF PROCEDURE:  08/16/2008  DATE OF DISCHARGE:                               OPERATIVE REPORT   PREOPERATIVE DIAGNOSIS:  Septic abortion.   POSTOPERATIVE DIAGNOSIS:  Septic abortion.   PROCEDURE:  Suction dilation and curettage.   SURGEON:  Shelbie Proctor. Shawnie Pons, MD   ASSISTANT:  None.   ANESTHESIA:  MAC and local, Raul Del, MD   FINDINGS:  Ten-week size uterus.   SPECIMEN:  Uterine contents to Pathology.   BLOOD LOSS:  100 mL.   COMPLICATIONS:  None.   REASON FOR PROCEDURE:  The patient is a 35 year old gravida 21, para 3,  who had a missed AB at 9 weeks in March of this year.  She was  prescribed Cytotec, but the patient never took.  The patient was then  seen in April where she was given Cytotec in the ER and then had  continued bleeding and fevers.  The patient was seen in clinic, was put  on Rocephin, had an ultrasound that showed 3.5 cm retained products of  conception, persistently positive beta hCG at 122, and was put on Cipro.  It was unclear whether she got more Cytotec at that time or whether she  even took it.  Apparently, she had had low-grade fevers, abdominal pain,  and some bleeding over the last week and was found to be syncopal at  home and EMS was called on the morning of the procedure.  The patient  was then taken to [facility] where she had fever of 103.2  and was hypotensive.  She had fluid resuscitation, IV antibiotics at  [facility] and was transferred to [facility] for emergent treatment.  Here at  bedside ultrasound confirmed retained products of conception and the  patient was taken urgently to the OR for D and C.   PROCEDURE IN DETAIL:  The patient was taken to the OR and she was placed  in  dorsal lithotomy and Allen stirrups.  She was prepped and draped in  the usual sterile fashion.  Foley catheter was in her bladder.  A  speculum was placed inside the vagina and the cervix was visualized,  grasped anteriorly with a single-tooth tenaculum.  A 20 mL of 0.25%  Marcaine with epinephrine were used for paracervical block.  The uterus  sounded to 10 cm.  Upon removal of the sound,  blood was coming from the  os.  The cervix was sequentially dilated.  A 10-French curved suction  curette was then passed x3, minimal amount of tissue was actually  obtained.  Sharp curettage was then performed, a gritty texture was  found in all 4 quadrants with very little tissue coming out with this  procedure either.  A 10-French suction curette was passed once more with  almost no bleeding or tissue removed.  All instruments  were  then removed from the vagina.  Vigorous bimanual massage resulted in a  firm tightly clamped uterus with minimal bleeding.  All instruments and  lap counts were correct x2.  The patient was awake and taken to recovery  room in stable condition.      Shelbie Proctor. Shawnie Pons, M.D.  Electronically Signed     TSP/MEDQ  D:  08/16/2008  T:  08/17/2008  Job:  [id number]

## 2010-08-19 ENCOUNTER — Encounter: Payer: Medicaid Other | Admitting: Hematology and Oncology

## 2010-08-19 ENCOUNTER — Encounter (HOSPITAL_BASED_OUTPATIENT_CLINIC_OR_DEPARTMENT_OTHER): Payer: Medicaid Other | Admitting: Hematology and Oncology

## 2010-08-19 DIAGNOSIS — Z932 Ileostomy status: Secondary | ICD-10-CM

## 2010-08-19 DIAGNOSIS — Z800 Family history of malignant neoplasm of digestive organs: Secondary | ICD-10-CM

## 2010-08-19 DIAGNOSIS — C187 Malignant neoplasm of sigmoid colon: Secondary | ICD-10-CM

## 2010-08-19 DIAGNOSIS — Z9049 Acquired absence of other specified parts of digestive tract: Secondary | ICD-10-CM

## 2010-08-21 ENCOUNTER — Emergency Department (HOSPITAL_COMMUNITY)
Admission: EM | Admit: 2010-08-21 | Discharge: 2010-08-22 | Disposition: A | Discharge status: Home / Self Care | Payer: Medicaid Other | Attending: Emergency Medicine | Admitting: Emergency Medicine

## 2010-08-21 DIAGNOSIS — R109 Unspecified abdominal pain: Secondary | ICD-10-CM | POA: Insufficient documentation

## 2010-08-21 DIAGNOSIS — J45909 Unspecified asthma, uncomplicated: Secondary | ICD-10-CM | POA: Insufficient documentation

## 2010-08-21 DIAGNOSIS — E0590 Thyrotoxicosis, unspecified without thyrotoxic crisis or storm: Secondary | ICD-10-CM | POA: Insufficient documentation

## 2010-08-21 DIAGNOSIS — IMO0002 Reserved for concepts with insufficient information to code with codable children: Secondary | ICD-10-CM | POA: Insufficient documentation

## 2010-08-21 DIAGNOSIS — K219 Gastro-esophageal reflux disease without esophagitis: Secondary | ICD-10-CM | POA: Insufficient documentation

## 2010-08-24 ENCOUNTER — Other Ambulatory Visit: Payer: Self-pay | Admitting: Hematology and Oncology

## 2010-08-24 ENCOUNTER — Other Ambulatory Visit (INDEPENDENT_AMBULATORY_CARE_PROVIDER_SITE_OTHER): Payer: Self-pay | Admitting: Surgery

## 2010-08-24 ENCOUNTER — Encounter (HOSPITAL_BASED_OUTPATIENT_CLINIC_OR_DEPARTMENT_OTHER): Payer: Medicaid Other | Admitting: Hematology and Oncology

## 2010-08-24 DIAGNOSIS — C189 Malignant neoplasm of colon, unspecified: Secondary | ICD-10-CM

## 2010-08-24 LAB — CBC WITH DIFFERENTIAL/PLATELET
BASO%: 0.1 % (ref 0.0–2.0)
LYMPH%: 32 % (ref 14.0–49.7)
MCHC: 32.5 g/dL (ref 31.5–36.0)
MCV: 79.5 fL (ref 79.5–101.0)
MONO#: 0.5 10*3/uL (ref 0.1–0.9)
MONO%: 6.3 % (ref 0.0–14.0)
Platelets: 447 10*3/uL — ABNORMAL HIGH (ref 145–400)
RBC: 5.03 10*6/uL (ref 3.70–5.45)
RDW: 15.3 % — ABNORMAL HIGH (ref 11.2–14.5)
WBC: 7.6 10*3/uL (ref 3.9–10.3)

## 2010-08-25 LAB — COMPREHENSIVE METABOLIC PANEL
ALT: 19 U/L (ref 0–35)
Alkaline Phosphatase: 81 U/L (ref 39–117)
Sodium: 139 mEq/L (ref 135–145)
Total Bilirubin: 0.5 mg/dL (ref 0.3–1.2)
Total Protein: 6.6 g/dL (ref 6.0–8.3)

## 2010-09-02 ENCOUNTER — Encounter (INDEPENDENT_AMBULATORY_CARE_PROVIDER_SITE_OTHER): Payer: Self-pay | Admitting: Surgery

## 2010-09-05 ENCOUNTER — Encounter (HOSPITAL_BASED_OUTPATIENT_CLINIC_OR_DEPARTMENT_OTHER)
Admission: RE | Admit: 2010-09-05 | Discharge: 2010-09-05 | Disposition: A | Discharge status: Home / Self Care | Payer: Medicaid Other | Source: Ambulatory Visit | Attending: Surgery | Admitting: Surgery

## 2010-09-05 LAB — CBC
HCT: 39.4 % (ref 36.0–46.0)
MCH: 26.5 pg (ref 26.0–34.0)
MCHC: 33.2 g/dL (ref 30.0–36.0)
MCV: 79.8 fL (ref 78.0–100.0)
RDW: 15.2 % (ref 11.5–15.5)

## 2010-09-05 LAB — APTT: aPTT: 28 seconds (ref 24–37)

## 2010-09-06 ENCOUNTER — Ambulatory Visit (HOSPITAL_COMMUNITY): Payer: Medicaid Other | Attending: Surgery

## 2010-09-06 ENCOUNTER — Ambulatory Visit (HOSPITAL_BASED_OUTPATIENT_CLINIC_OR_DEPARTMENT_OTHER)
Admission: RE | Admit: 2010-09-06 | Discharge: 2010-09-06 | Disposition: A | Discharge status: Home / Self Care | Payer: Medicaid Other | Source: Ambulatory Visit | Attending: Surgery | Admitting: Surgery

## 2010-09-06 ENCOUNTER — Ambulatory Visit (HOSPITAL_COMMUNITY): Payer: Medicaid Other

## 2010-09-06 DIAGNOSIS — C189 Malignant neoplasm of colon, unspecified: Secondary | ICD-10-CM | POA: Insufficient documentation

## 2010-09-06 DIAGNOSIS — Z01812 Encounter for preprocedural laboratory examination: Secondary | ICD-10-CM | POA: Insufficient documentation

## 2010-09-08 ENCOUNTER — Ambulatory Visit
Admission: RE | Admit: 2010-09-08 | Discharge: 2010-09-08 | Disposition: A | Discharge status: Home / Self Care | Payer: Medicaid Other | Source: Ambulatory Visit | Attending: Surgery | Admitting: Surgery

## 2010-09-08 DIAGNOSIS — C189 Malignant neoplasm of colon, unspecified: Secondary | ICD-10-CM

## 2010-09-13 ENCOUNTER — Other Ambulatory Visit: Payer: Self-pay | Admitting: Hematology and Oncology

## 2010-09-13 ENCOUNTER — Encounter (HOSPITAL_BASED_OUTPATIENT_CLINIC_OR_DEPARTMENT_OTHER): Payer: Medicaid Other | Admitting: Hematology and Oncology

## 2010-09-13 DIAGNOSIS — C189 Malignant neoplasm of colon, unspecified: Secondary | ICD-10-CM

## 2010-09-13 LAB — CBC WITH DIFFERENTIAL/PLATELET
BASO%: 0.2 % (ref 0.0–2.0)
HCT: 39.3 % (ref 34.8–46.6)
LYMPH%: 17.5 % (ref 14.0–49.7)
MCH: 26.7 pg (ref 25.1–34.0)
MCHC: 32.9 g/dL (ref 31.5–36.0)
MCV: 81.1 fL (ref 79.5–101.0)
MONO#: 0.4 10*3/uL (ref 0.1–0.9)
MONO%: 2.8 % (ref 0.0–14.0)
NEUT%: 74.2 % (ref 38.4–76.8)
Platelets: 235 10*3/uL (ref 145–400)
WBC: 12.8 10*3/uL — ABNORMAL HIGH (ref 3.9–10.3)

## 2010-09-13 LAB — COMPREHENSIVE METABOLIC PANEL
ALT: 16 U/L (ref 0–35)
CO2: 21 mEq/L (ref 19–32)
Creatinine, Ser: 0.71 mg/dL (ref 0.50–1.10)
Total Bilirubin: 0.5 mg/dL (ref 0.3–1.2)

## 2010-09-21 ENCOUNTER — Ambulatory Visit: Payer: Medicaid Other

## 2010-09-21 NOTE — Op Note (Signed)
  NAMEKAILYNNE, FERRINGTON               ACCOUNT NO.:  [id number]  MEDICAL RECORD NO.:  [id number]  LOCATION:                                 FACILITY:  PHYSICIAN:  Wilmon Arms. Corliss Skains, M.D. DATE OF BIRTH:  07/15/1975  DATE OF PROCEDURE:  09/06/2010 DATE OF DISCHARGE:                              OPERATIVE REPORT   PREOPERATIVE DIAGNOSIS:  Colon cancer.  POSTOPERATIVE DIAGNOSIS:  Colon cancer.  PROCEDURE:  Left subclavian vein port placement.  SURGEON:  Wilmon Arms. Corliss Skains, MD  ANESTHESIA:  General.  INDICATIONS:  This is a 35 year old female who recently underwent resection of a colon cancer in her sigmoid colon.  She had positive lymph nodes.  She is scheduled to begin receiving chemotherapy.  She presents now for port placement.  DESCRIPTION OF PROCEDURE:  The patient was brought to the operating room, placed in supine position on operating room table.  After an adequate level of general anesthesia was obtained, a bump was placedbehind her shoulders.  Her left arm was placed in her side.  We prepped her left chest with ChloraPrep and draped in sterile fashion.  Time-out was taken to assure the proper patient and proper procedure.  We placed the patient in Trendelenburg.  We infiltrated the area below the left clavicle with 0.25% Marcaine with epinephrine.  A 19-gauge needle was used to cannulate the subclavian vein.  This was done on a single stick. Good blood return was noted.  The wire was passed under fluoroscopic guidance into the superior vena cava.  We then created a subcutaneous pocket just below our insertion site.  Once our pocket was large enough, we made a small subcutaneous tunnel from the pocket up to the initial insertion site.  We passed the catheter from the subcutaneous pocket to the insertion site.  We were using an 8-French PowerPort.  We attached the catheter to the port and inserted the port into the pocket.  The port was then secured in place with two  interrupted 2-0 Prolene sutures. The catheter had previously been flushed.  We used the fluoroscope to cut the catheter to the appropriate length.  We then passed the dilator and breakaway sheath over the wire into the clavipectoral fascia into the vein.  The wire and dilator were removed.  The catheter was passed through the breakaway sheath which was then removed.  Fluoroscopy confirmed good placement with no sign of any kinks or obstruction.  The catheter was then accessed with a Huber needle.  Good blood return was noted.  It flushed easily.  We then closed the skin with 3-0 Vicryl and 4-0 Monocryl.  We instilled 5 mL of concentrated heparin solution into the catheter.  Steri-Strips and occlusive dressing were applied.  The patient was extubated and brought to recovery in stable condition.  All sponge, instrument, and needle counts were correct.     Wilmon Arms. Corliss Skains, M.D.     MKT/MEDQ  D:  09/06/2010  T:  09/07/2010  Job:  [id number]  Electronically Signed by Manus Rudd M.D. on 09/21/2010 09:06:19 AM

## 2010-10-04 ENCOUNTER — Other Ambulatory Visit (INDEPENDENT_AMBULATORY_CARE_PROVIDER_SITE_OTHER): Payer: Self-pay | Admitting: Surgery

## 2010-10-04 ENCOUNTER — Inpatient Hospital Stay (HOSPITAL_COMMUNITY)
Admission: RE | Admit: 2010-10-04 | Discharge: 2010-10-07 | DRG: 346 | Disposition: A | Discharge status: Home Health | Payer: Medicaid Other | Source: Ambulatory Visit | Attending: Surgery | Admitting: Surgery

## 2010-10-04 DIAGNOSIS — J45909 Unspecified asthma, uncomplicated: Secondary | ICD-10-CM | POA: Diagnosis present

## 2010-10-04 DIAGNOSIS — Z432 Encounter for attention to ileostomy: Principal | ICD-10-CM

## 2010-10-04 DIAGNOSIS — Z85038 Personal history of other malignant neoplasm of large intestine: Secondary | ICD-10-CM

## 2010-10-04 LAB — CBC
HCT: 40.6 % (ref 36.0–46.0)
MCV: 80.2 fL (ref 78.0–100.0)
RBC: 5.06 MIL/uL (ref 3.87–5.11)
RDW: 15.2 % (ref 11.5–15.5)
WBC: 10.3 10*3/uL (ref 4.0–10.5)

## 2010-10-04 LAB — BASIC METABOLIC PANEL
BUN: 9 mg/dL (ref 6–23)
Chloride: 107 mEq/L (ref 96–112)
Glucose, Bld: 99 mg/dL (ref 70–99)
Potassium: 4 mEq/L (ref 3.5–5.1)

## 2010-10-07 NOTE — Discharge Summary (Signed)
  NAMESHAINDEL, SWEETEN               ACCOUNT NO.:  [id number]  MEDICAL RECORD NO.:  [id number]  LOCATION:  5122                         FACILITY:  [facility]  PHYSICIAN:  Wilmon Arms. Corliss Skains, M.D. 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  DISCHARGE DIAGNOSIS:  Sigmoid colon cancer.  PROCEDURE PERFORMED:  Closure of loop ileostomy.  BRIEF HISTORY:  This is a 35 year old female who is recently diagnosed with sigmoid colon cancer.  She underwent a resection 2 months ago and required a diverting loop ileostomy.  Subsequently, her rectal anastomosis has been tested and shows no sign of leak.  She presents now for loop ileostomy closure.  HOSPITAL COURSE:  The patient was admitted to the hospital on October 04, 2010.  She underwent an uncomplicated loop ileostomy closure with a stapled anastomosis.  Her wound is being treated with wet-to-dry dressing.  The patient was maintained on clear liquids for a couple days until she began having bowel movements.  Her diet has been advanced. She is on oral pain medication.  DISCHARGE INSTRUCTIONS:  She was given Percocet p.r.n. for pain.  Home health nursing has been arranged for dressing changes daily.  She may ambulate as tolerated.  Follow up in 2 weeks for wound check.  She is scheduled to begin chemotherapy in 2 weeks.     Wilmon Arms. Corliss Skains, M.D.     MKT/MEDQ  D:  10/07/2010  T:  10/07/2010  Job:  [id number]  Electronically Signed by Manus Rudd M.D. on 10/07/2010 01:54:18 PM

## 2010-10-10 ENCOUNTER — Telehealth (INDEPENDENT_AMBULATORY_CARE_PROVIDER_SITE_OTHER): Payer: Self-pay | Admitting: General Surgery

## 2010-10-10 MED ORDER — OXYCODONE HCL 5 MG PO TABS: 5.0000 mg | ORAL_TABLET | Freq: Four times a day (QID) | ORAL | Status: DC | PRN

## 2010-10-10 NOTE — Op Note (Signed)
  NAMEMAGHEN, GROUP               ACCOUNT NO.:  [id number]  MEDICAL RECORD NO.:  [id number]  LOCATION:  5122                         FACILITY:  [facility]  PHYSICIAN:  Wilmon Arms. Corliss Skains, M.D. DATE OF BIRTH:  [birth year]  DATE OF PROCEDURE:  10/04/2010 DATE OF DISCHARGE:  10/07/2010                              OPERATIVE REPORT   PREOPERATIVE DIAGNOSES: 1. Sigmoid colon cancer. 2. Diverting loop ileostomy.  POSTOPERATIVE DIAGNOSIS: 1. Sigmoid colon cancer. 2. Diverting loop ileostomy.  PROCEDURE:  Reversal of loop ileostomy.  SURGEON:  Wilmon Arms. Corliss Skains, MD  ASSISTANT:  Clovis Pu. Cornett, MD  ANESTHESIA:  General endotracheal.  INDICATIONS:  This is a 35 year old female who is status post low anterior resection for a large sigmoid colon cancer.  She had creation of a diverting loop ileostomy.  The patient is scheduled to begin chemotherapy soon.  She had a Gastrografin enema which showed no shoes sign of leak or obstruction in the rectum.  She presents now for ileostomy reversal.  DESCRIPTION OF PROCEDURE:  The patient was brought to the operating room, placed in the supine position on the operating table.  After adequate level of general anesthesia was obtained, a Foley catheter was placed under sterile technique.  Her abdomen was prepped with ChloraPrep and her ileostomy was prepped with Betadine.  Her abdomen was draped in sterile fashion.  A time-out was taken to assure proper patient and proper procedure.  We made an elliptical incision around her ileostomy. Dissection was carried down into the subcutaneous tissues with cautery. We dissect around the loop ileostomy all the way down to the fascia.  We dissected the small bowel free from the fascia.  We entered the peritoneal cavity and we were able to mobilize the loop ileostomy up to the wound fairly easily.  We had 2 good limbs of small bowel.  We created small enterotomies and inserted the limbs of the GIA stapler down  the enterotomies.  We created side-to-side stapled anastomosis.  We divided some of the mesentery with Kelly clamps and 2-0 silk ties.  The old ileostomy was then amputated with a TA-60 stapler.  This was sent for pathologic examination.  We reinforced the anastomosis with 3-0 silk.  We dropped anastomosis back in the peritoneal cavity.  We irrigated thoroughly.  The fascia was reapproximated with #1 PDS.  The subcutaneous tissues were thoroughly irrigated and packed with saline-soaked Kerlix. Dry dressing was applied.  The patient was then extubated and brought to recovery room in stable condition.  All sponge, instrument, and needle counts were correct.     Wilmon Arms. Corliss Skains, M.D.     MKT/MEDQ  D:  10/07/2010  T:  10/08/2010  Job:  [id number]  Electronically Signed by Manus Rudd M.D. on 10/10/2010 03:19:34 PM

## 2010-10-10 NOTE — Telephone Encounter (Signed)
Pt req pain med Dr Corliss Skains ok the refill on Oxycodone 5mg 

## 2010-10-14 ENCOUNTER — Other Ambulatory Visit (HOSPITAL_COMMUNITY): Payer: Medicaid Other

## 2010-10-17 ENCOUNTER — Encounter (INDEPENDENT_AMBULATORY_CARE_PROVIDER_SITE_OTHER): Payer: Self-pay | Admitting: Surgery

## 2010-10-17 ENCOUNTER — Other Ambulatory Visit (INDEPENDENT_AMBULATORY_CARE_PROVIDER_SITE_OTHER): Payer: Self-pay

## 2010-10-18 ENCOUNTER — Encounter (INDEPENDENT_AMBULATORY_CARE_PROVIDER_SITE_OTHER): Payer: Self-pay | Admitting: Surgery

## 2010-10-18 ENCOUNTER — Ambulatory Visit (INDEPENDENT_AMBULATORY_CARE_PROVIDER_SITE_OTHER): Payer: Medicaid Other | Admitting: Surgery

## 2010-10-18 VITALS — Temp 98.2°F | Wt 148.0 lb

## 2010-10-18 DIAGNOSIS — C189 Malignant neoplasm of colon, unspecified: Secondary | ICD-10-CM

## 2010-10-18 MED ORDER — HYDROCODONE-ACETAMINOPHEN 5-325 MG PO TABS: 1.0000 | ORAL_TABLET | ORAL | Status: DC | PRN

## 2010-10-18 NOTE — Progress Notes (Signed)
This patient is status post reversal of her loop ileostomy on July 3. She was discharged home on July 6. Home health has been assisting her with her dressing changes to her right lower quadrant incision. She is still having some incisional pain and is taking Vicodin. She is having bowel movements every day. These bowel movements alternates between diarrhea and constipation. She is not using any stool softeners but has taken some MiraLax.  The patient is afebrile with a temperature of 98.2 Her abdomen is soft and tender only around her right lower quadrant wound. The wound is clean and well granulated. It is only about 2 cm deep by 4 cm long. Minimal drainage on the dressing. No surrounding erythema.  Impression: Status post loop ileostomy closure. The patient is scheduled to begin chemotherapy tomorrow. Although the wound is not completely healed, I do think that the patient should go ahead and start her chemotherapy as scheduled.  Plan: Continue daily wet-to-dry dressings to her incision. She should use a daily fiber supplement to help regulate her bowel movements. Followup in 3 weeks. I gave her a final refill of Vicodin today.

## 2010-10-18 NOTE — Patient Instructions (Signed)
Continue daily wet-to-dry dressing changes. 

## 2010-10-24 ENCOUNTER — Other Ambulatory Visit (INDEPENDENT_AMBULATORY_CARE_PROVIDER_SITE_OTHER): Payer: Self-pay | Admitting: General Surgery

## 2010-10-24 DIAGNOSIS — R52 Pain, unspecified: Secondary | ICD-10-CM

## 2010-10-24 MED ORDER — HYDROCODONE-ACETAMINOPHEN 5-325 MG PO TABS: 1.0000 | ORAL_TABLET | ORAL | Status: DC | PRN

## 2010-10-24 NOTE — Telephone Encounter (Signed)
Faxed back to [name] 10-24-10

## 2010-10-26 ENCOUNTER — Other Ambulatory Visit: Payer: Self-pay | Admitting: Hematology and Oncology

## 2010-10-26 ENCOUNTER — Telehealth (INDEPENDENT_AMBULATORY_CARE_PROVIDER_SITE_OTHER): Payer: Self-pay | Admitting: General Surgery

## 2010-10-26 DIAGNOSIS — C189 Malignant neoplasm of colon, unspecified: Secondary | ICD-10-CM

## 2010-10-26 NOTE — Telephone Encounter (Signed)
PT CALLED TO LET DR. Corliss Skains KNOW THAT THE ODOR FROM HER WOUND IS STRONGER COMPARED TO SEVERAL DAYS AGO. DRAINAGE IS SAME COLOR AND WOUND BED PINK LOOKING PER PATIENT. NO FEVER, NAUSEA OR VOMITING. PATIENT HAS F/U APPT WITH DR. Corliss Skains ON 11-08-10.

## 2010-11-01 ENCOUNTER — Encounter (HOSPITAL_COMMUNITY): Payer: Self-pay

## 2010-11-01 ENCOUNTER — Other Ambulatory Visit (HOSPITAL_COMMUNITY): Payer: Medicaid Other

## 2010-11-01 ENCOUNTER — Ambulatory Visit (HOSPITAL_COMMUNITY)
Admission: RE | Admit: 2010-11-01 | Discharge: 2010-11-01 | Disposition: A | Discharge status: Home / Self Care | Payer: Medicaid Other | Source: Ambulatory Visit | Attending: Hematology and Oncology | Admitting: Hematology and Oncology

## 2010-11-01 DIAGNOSIS — C189 Malignant neoplasm of colon, unspecified: Secondary | ICD-10-CM | POA: Insufficient documentation

## 2010-11-01 DIAGNOSIS — Z79899 Other long term (current) drug therapy: Secondary | ICD-10-CM | POA: Insufficient documentation

## 2010-11-01 DIAGNOSIS — K7689 Other specified diseases of liver: Secondary | ICD-10-CM | POA: Insufficient documentation

## 2010-11-01 DIAGNOSIS — J984 Other disorders of lung: Secondary | ICD-10-CM | POA: Insufficient documentation

## 2010-11-01 HISTORY — DX: Essential (primary) hypertension: I10

## 2010-11-01 HISTORY — DX: Thyrotoxicosis with diffuse goiter without thyrotoxic crisis or storm: E05.00

## 2010-11-01 MED ORDER — IOHEXOL 300 MG/ML  SOLN
100.0000 mL | Freq: Once | INTRAMUSCULAR | Status: AC | PRN
  Administered 2010-11-01: 100 mL via INTRAVENOUS

## 2010-11-03 ENCOUNTER — Other Ambulatory Visit: Payer: Self-pay | Admitting: Hematology and Oncology

## 2010-11-03 DIAGNOSIS — C189 Malignant neoplasm of colon, unspecified: Secondary | ICD-10-CM

## 2010-11-07 ENCOUNTER — Encounter (HOSPITAL_COMMUNITY)
Admission: RE | Admit: 2010-11-07 | Discharge: 2010-11-07 | Disposition: A | Discharge status: Home / Self Care | Payer: Medicaid Other | Source: Ambulatory Visit | Attending: Hematology and Oncology | Admitting: Hematology and Oncology

## 2010-11-07 DIAGNOSIS — C787 Secondary malignant neoplasm of liver and intrahepatic bile duct: Secondary | ICD-10-CM | POA: Insufficient documentation

## 2010-11-07 DIAGNOSIS — C189 Malignant neoplasm of colon, unspecified: Secondary | ICD-10-CM | POA: Insufficient documentation

## 2010-11-07 LAB — GLUCOSE, CAPILLARY: Glucose-Capillary: 102 mg/dL — ABNORMAL HIGH (ref 70–99)

## 2010-11-07 MED ORDER — FLUDEOXYGLUCOSE F - 18 (FDG) INJECTION
16.4000 | Freq: Once | INTRAVENOUS | Status: AC | PRN
  Administered 2010-11-07: 16.4 via INTRAVENOUS

## 2010-11-08 ENCOUNTER — Other Ambulatory Visit: Payer: Self-pay | Admitting: Hematology and Oncology

## 2010-11-08 ENCOUNTER — Encounter (INDEPENDENT_AMBULATORY_CARE_PROVIDER_SITE_OTHER): Payer: Self-pay | Admitting: Surgery

## 2010-11-08 ENCOUNTER — Ambulatory Visit (INDEPENDENT_AMBULATORY_CARE_PROVIDER_SITE_OTHER): Payer: Self-pay | Admitting: Surgery

## 2010-11-08 ENCOUNTER — Encounter (HOSPITAL_BASED_OUTPATIENT_CLINIC_OR_DEPARTMENT_OTHER): Payer: Medicaid Other | Admitting: Hematology and Oncology

## 2010-11-08 VITALS — Temp 97.7°F | Wt 154.0 lb

## 2010-11-08 DIAGNOSIS — E039 Hypothyroidism, unspecified: Secondary | ICD-10-CM

## 2010-11-08 DIAGNOSIS — K7689 Other specified diseases of liver: Secondary | ICD-10-CM

## 2010-11-08 DIAGNOSIS — Z932 Ileostomy status: Secondary | ICD-10-CM

## 2010-11-08 DIAGNOSIS — C187 Malignant neoplasm of sigmoid colon: Secondary | ICD-10-CM

## 2010-11-08 DIAGNOSIS — C189 Malignant neoplasm of colon, unspecified: Secondary | ICD-10-CM

## 2010-11-08 DIAGNOSIS — J984 Other disorders of lung: Secondary | ICD-10-CM

## 2010-11-08 LAB — CBC WITH DIFFERENTIAL/PLATELET
BASO%: 0.3 % (ref 0.0–2.0)
Basophils Absolute: 0 10*3/uL (ref 0.0–0.1)
HCT: 36.2 % (ref 34.8–46.6)
HGB: 12.1 g/dL (ref 11.6–15.9)
MONO#: 0.6 10*3/uL (ref 0.1–0.9)
NEUT#: 7.2 10*3/uL — ABNORMAL HIGH (ref 1.5–6.5)
NEUT%: 63.8 % (ref 38.4–76.8)
WBC: 11.3 10*3/uL — ABNORMAL HIGH (ref 3.9–10.3)
lymph#: 2.5 10*3/uL (ref 0.9–3.3)

## 2010-11-08 LAB — COMPREHENSIVE METABOLIC PANEL
ALT: 10 U/L (ref 0–35)
Albumin: 4 g/dL (ref 3.5–5.2)
BUN: 9 mg/dL (ref 6–23)
CO2: 25 mEq/L (ref 19–32)
Calcium: 9.1 mg/dL (ref 8.4–10.5)
Chloride: 105 mEq/L (ref 96–112)
Creatinine, Ser: 0.78 mg/dL (ref 0.50–1.10)

## 2010-11-08 NOTE — Progress Notes (Signed)
The patient's wound is completely healed. She has a little bit of residual soreness around her incision but this should resolve.  The patient had a CT scan recently which showed a possibility of liver metastases. A PET scan was performed yesterday which showed no activity in the liver at the site of these lesions. The patient is scheduled to see Dr. Dalene Carrow later today to discuss these findings.  Impression: status post sigmoid colectomy for a T3 N1 colon cancer status post closure of loop ileostomy  We will see the patient back on a p.r.n. basis as she has many appointments upcoming with Dr. Dalene Carrow

## 2010-11-16 ENCOUNTER — Other Ambulatory Visit: Payer: Self-pay | Admitting: Hematology and Oncology

## 2010-11-16 ENCOUNTER — Encounter (HOSPITAL_BASED_OUTPATIENT_CLINIC_OR_DEPARTMENT_OTHER): Payer: Medicaid Other | Admitting: Hematology and Oncology

## 2010-11-16 DIAGNOSIS — C187 Malignant neoplasm of sigmoid colon: Secondary | ICD-10-CM

## 2010-11-16 DIAGNOSIS — Z932 Ileostomy status: Secondary | ICD-10-CM

## 2010-11-16 DIAGNOSIS — C189 Malignant neoplasm of colon, unspecified: Secondary | ICD-10-CM

## 2010-11-16 DIAGNOSIS — E039 Hypothyroidism, unspecified: Secondary | ICD-10-CM

## 2010-11-16 DIAGNOSIS — Z5111 Encounter for antineoplastic chemotherapy: Secondary | ICD-10-CM

## 2010-11-16 LAB — CBC WITH DIFFERENTIAL/PLATELET
BASO%: 0.2 % (ref 0.0–2.0)
HCT: 44.2 % (ref 34.8–46.6)
MCHC: 32.1 g/dL (ref 31.5–36.0)
MONO#: 0.7 10*3/uL (ref 0.1–0.9)
NEUT%: 52.2 % (ref 38.4–76.8)
RDW: 15.2 % — ABNORMAL HIGH (ref 11.2–14.5)
WBC: 9.2 10*3/uL (ref 3.9–10.3)
lymph#: 2.6 10*3/uL (ref 0.9–3.3)
nRBC: 0 % (ref 0–0)

## 2010-11-18 ENCOUNTER — Encounter (HOSPITAL_BASED_OUTPATIENT_CLINIC_OR_DEPARTMENT_OTHER): Payer: Medicaid Other | Admitting: Hematology and Oncology

## 2010-11-18 DIAGNOSIS — Z5189 Encounter for other specified aftercare: Secondary | ICD-10-CM

## 2010-11-18 DIAGNOSIS — C187 Malignant neoplasm of sigmoid colon: Secondary | ICD-10-CM

## 2010-11-29 ENCOUNTER — Other Ambulatory Visit: Payer: Self-pay | Admitting: Hematology and Oncology

## 2010-11-29 ENCOUNTER — Encounter (HOSPITAL_BASED_OUTPATIENT_CLINIC_OR_DEPARTMENT_OTHER): Payer: Medicaid Other | Admitting: Hematology and Oncology

## 2010-11-29 DIAGNOSIS — C187 Malignant neoplasm of sigmoid colon: Secondary | ICD-10-CM

## 2010-11-29 DIAGNOSIS — C189 Malignant neoplasm of colon, unspecified: Secondary | ICD-10-CM

## 2010-11-29 DIAGNOSIS — Z932 Ileostomy status: Secondary | ICD-10-CM

## 2010-11-29 DIAGNOSIS — E039 Hypothyroidism, unspecified: Secondary | ICD-10-CM

## 2010-11-29 LAB — CBC WITH DIFFERENTIAL/PLATELET
Basophils Absolute: 0 10*3/uL (ref 0.0–0.1)
Eosinophils Absolute: 0.4 10*3/uL (ref 0.0–0.5)
HGB: 12.3 g/dL (ref 11.6–15.9)
MCV: 81.8 fL (ref 79.5–101.0)
MONO#: 0.4 10*3/uL (ref 0.1–0.9)
NEUT#: 3.4 10*3/uL (ref 1.5–6.5)
RDW: 15.5 % — ABNORMAL HIGH (ref 11.2–14.5)
WBC: 6.4 10*3/uL (ref 3.9–10.3)
lymph#: 2.2 10*3/uL (ref 0.9–3.3)

## 2010-11-29 LAB — COMPREHENSIVE METABOLIC PANEL
BUN: 8 mg/dL (ref 6–23)
CO2: 25 mEq/L (ref 19–32)
Calcium: 9.1 mg/dL (ref 8.4–10.5)
Chloride: 109 mEq/L (ref 96–112)
Creatinine, Ser: 0.69 mg/dL (ref 0.50–1.10)

## 2010-11-30 ENCOUNTER — Encounter (HOSPITAL_BASED_OUTPATIENT_CLINIC_OR_DEPARTMENT_OTHER): Payer: Medicaid Other | Admitting: Hematology and Oncology

## 2010-11-30 DIAGNOSIS — Z5111 Encounter for antineoplastic chemotherapy: Secondary | ICD-10-CM

## 2010-11-30 DIAGNOSIS — C187 Malignant neoplasm of sigmoid colon: Secondary | ICD-10-CM

## 2010-12-02 ENCOUNTER — Encounter (HOSPITAL_BASED_OUTPATIENT_CLINIC_OR_DEPARTMENT_OTHER): Payer: Medicaid Other | Admitting: Hematology and Oncology

## 2010-12-02 DIAGNOSIS — C187 Malignant neoplasm of sigmoid colon: Secondary | ICD-10-CM

## 2010-12-08 ENCOUNTER — Ambulatory Visit (INDEPENDENT_AMBULATORY_CARE_PROVIDER_SITE_OTHER): Payer: Self-pay | Admitting: Surgery

## 2010-12-22 ENCOUNTER — Ambulatory Visit (INDEPENDENT_AMBULATORY_CARE_PROVIDER_SITE_OTHER): Payer: Self-pay | Admitting: Surgery

## 2010-12-27 ENCOUNTER — Other Ambulatory Visit: Payer: Self-pay | Admitting: Hematology and Oncology

## 2010-12-27 ENCOUNTER — Encounter (HOSPITAL_BASED_OUTPATIENT_CLINIC_OR_DEPARTMENT_OTHER): Payer: Medicaid Other | Admitting: Hematology and Oncology

## 2010-12-27 DIAGNOSIS — C187 Malignant neoplasm of sigmoid colon: Secondary | ICD-10-CM

## 2010-12-27 DIAGNOSIS — E039 Hypothyroidism, unspecified: Secondary | ICD-10-CM

## 2010-12-27 DIAGNOSIS — Z932 Ileostomy status: Secondary | ICD-10-CM

## 2010-12-27 DIAGNOSIS — C189 Malignant neoplasm of colon, unspecified: Secondary | ICD-10-CM

## 2010-12-27 LAB — CBC WITH DIFFERENTIAL/PLATELET
BASO%: 0.3 % (ref 0.0–2.0)
Basophils Absolute: 0 10*3/uL (ref 0.0–0.1)
HCT: 40.5 % (ref 34.8–46.6)
HGB: 13.6 g/dL (ref 11.6–15.9)
MONO#: 0.6 10*3/uL (ref 0.1–0.9)
NEUT%: 65 % (ref 38.4–76.8)
WBC: 8.4 10*3/uL (ref 3.9–10.3)
lymph#: 1.8 10*3/uL (ref 0.9–3.3)

## 2010-12-27 LAB — COMPREHENSIVE METABOLIC PANEL
ALT: 17 U/L (ref 0–35)
BUN: 6 mg/dL (ref 6–23)
CO2: 26 mEq/L (ref 19–32)
Calcium: 9 mg/dL (ref 8.4–10.5)
Chloride: 106 mEq/L (ref 96–112)
Creatinine, Ser: 0.72 mg/dL (ref 0.50–1.10)

## 2010-12-29 ENCOUNTER — Ambulatory Visit: Payer: Medicaid Other | Admitting: Obstetrics & Gynecology

## 2011-01-09 ENCOUNTER — Encounter (INDEPENDENT_AMBULATORY_CARE_PROVIDER_SITE_OTHER): Payer: Self-pay | Admitting: Surgery

## 2011-01-10 ENCOUNTER — Other Ambulatory Visit: Payer: Self-pay | Admitting: Hematology and Oncology

## 2011-01-10 ENCOUNTER — Encounter (HOSPITAL_BASED_OUTPATIENT_CLINIC_OR_DEPARTMENT_OTHER): Payer: Medicaid Other | Admitting: Hematology and Oncology

## 2011-01-10 DIAGNOSIS — C189 Malignant neoplasm of colon, unspecified: Secondary | ICD-10-CM

## 2011-01-10 DIAGNOSIS — C187 Malignant neoplasm of sigmoid colon: Secondary | ICD-10-CM

## 2011-01-10 DIAGNOSIS — E039 Hypothyroidism, unspecified: Secondary | ICD-10-CM

## 2011-01-10 DIAGNOSIS — Z932 Ileostomy status: Secondary | ICD-10-CM

## 2011-01-10 DIAGNOSIS — D696 Thrombocytopenia, unspecified: Secondary | ICD-10-CM

## 2011-01-10 LAB — CBC WITH DIFFERENTIAL/PLATELET
BASO%: 0.4 % (ref 0.0–2.0)
HCT: 40 % (ref 34.8–46.6)
MCHC: 33.1 g/dL (ref 31.5–36.0)
MONO#: 0.4 10*3/uL (ref 0.1–0.9)
NEUT%: 53.6 % (ref 38.4–76.8)
RDW: 15.5 % — ABNORMAL HIGH (ref 11.2–14.5)
WBC: 6.8 10*3/uL (ref 3.9–10.3)
lymph#: 2.1 10*3/uL (ref 0.9–3.3)

## 2011-01-10 LAB — COMPREHENSIVE METABOLIC PANEL
ALT: 14 U/L (ref 0–35)
Albumin: 3.4 g/dL — ABNORMAL LOW (ref 3.5–5.2)
CO2: 26 mEq/L (ref 19–32)
Calcium: 9.2 mg/dL (ref 8.4–10.5)
Chloride: 105 mEq/L (ref 96–112)
Creatinine, Ser: 0.63 mg/dL (ref 0.50–1.10)
Potassium: 4.1 mEq/L (ref 3.5–5.3)
Sodium: 139 mEq/L (ref 135–145)
Total Protein: 6.5 g/dL (ref 6.0–8.3)

## 2011-01-11 ENCOUNTER — Encounter (HOSPITAL_BASED_OUTPATIENT_CLINIC_OR_DEPARTMENT_OTHER): Payer: Medicaid Other | Admitting: Hematology and Oncology

## 2011-01-11 DIAGNOSIS — C187 Malignant neoplasm of sigmoid colon: Secondary | ICD-10-CM

## 2011-01-11 DIAGNOSIS — Z23 Encounter for immunization: Secondary | ICD-10-CM

## 2011-01-11 DIAGNOSIS — Z5111 Encounter for antineoplastic chemotherapy: Secondary | ICD-10-CM

## 2011-01-13 ENCOUNTER — Encounter (HOSPITAL_BASED_OUTPATIENT_CLINIC_OR_DEPARTMENT_OTHER): Payer: Self-pay | Admitting: Hematology and Oncology

## 2011-01-13 DIAGNOSIS — C187 Malignant neoplasm of sigmoid colon: Secondary | ICD-10-CM

## 2011-01-20 ENCOUNTER — Other Ambulatory Visit: Payer: Self-pay | Admitting: Hematology and Oncology

## 2011-01-20 ENCOUNTER — Encounter (HOSPITAL_BASED_OUTPATIENT_CLINIC_OR_DEPARTMENT_OTHER): Payer: Medicaid Other | Admitting: Hematology and Oncology

## 2011-01-20 DIAGNOSIS — C187 Malignant neoplasm of sigmoid colon: Secondary | ICD-10-CM

## 2011-01-20 DIAGNOSIS — Z932 Ileostomy status: Secondary | ICD-10-CM

## 2011-01-20 DIAGNOSIS — D6959 Other secondary thrombocytopenia: Secondary | ICD-10-CM

## 2011-01-20 DIAGNOSIS — E039 Hypothyroidism, unspecified: Secondary | ICD-10-CM

## 2011-01-20 DIAGNOSIS — C189 Malignant neoplasm of colon, unspecified: Secondary | ICD-10-CM

## 2011-01-20 LAB — CBC WITH DIFFERENTIAL/PLATELET
Eosinophils Absolute: 0.5 10*3/uL (ref 0.0–0.5)
MONO#: 0.5 10*3/uL (ref 0.1–0.9)
NEUT#: 7.3 10*3/uL — ABNORMAL HIGH (ref 1.5–6.5)
Platelets: 65 10*3/uL — ABNORMAL LOW (ref 145–400)
RBC: 4.64 10*6/uL (ref 3.70–5.45)
RDW: 15.2 % — ABNORMAL HIGH (ref 11.2–14.5)
WBC: 10.4 10*3/uL — ABNORMAL HIGH (ref 3.9–10.3)
lymph#: 2.1 10*3/uL (ref 0.9–3.3)

## 2011-01-20 LAB — COMPREHENSIVE METABOLIC PANEL
Albumin: 4 g/dL (ref 3.5–5.2)
CO2: 23 mEq/L (ref 19–32)
Glucose, Bld: 89 mg/dL (ref 70–99)
Potassium: 4.2 mEq/L (ref 3.5–5.3)
Sodium: 140 mEq/L (ref 135–145)
Total Protein: 6.2 g/dL (ref 6.0–8.3)

## 2011-01-25 ENCOUNTER — Encounter (HOSPITAL_BASED_OUTPATIENT_CLINIC_OR_DEPARTMENT_OTHER): Payer: Self-pay | Admitting: Hematology and Oncology

## 2011-01-25 ENCOUNTER — Other Ambulatory Visit: Payer: Self-pay | Admitting: Hematology and Oncology

## 2011-01-25 DIAGNOSIS — Z932 Ileostomy status: Secondary | ICD-10-CM

## 2011-01-25 DIAGNOSIS — C187 Malignant neoplasm of sigmoid colon: Secondary | ICD-10-CM

## 2011-01-25 DIAGNOSIS — E039 Hypothyroidism, unspecified: Secondary | ICD-10-CM

## 2011-01-25 DIAGNOSIS — C189 Malignant neoplasm of colon, unspecified: Secondary | ICD-10-CM

## 2011-01-25 DIAGNOSIS — Z5111 Encounter for antineoplastic chemotherapy: Secondary | ICD-10-CM

## 2011-01-25 LAB — CBC WITH DIFFERENTIAL/PLATELET
BASO%: 0.4 % (ref 0.0–2.0)
Eosinophils Absolute: 0.4 10*3/uL (ref 0.0–0.5)
MCHC: 32.9 g/dL (ref 31.5–36.0)
MCV: 83.2 fL (ref 79.5–101.0)
MONO#: 0.5 10*3/uL (ref 0.1–0.9)
MONO%: 6.8 % (ref 0.0–14.0)
NEUT#: 3.8 10*3/uL (ref 1.5–6.5)
RBC: 5.07 10*6/uL (ref 3.70–5.45)
RDW: 15 % — ABNORMAL HIGH (ref 11.2–14.5)
WBC: 7.1 10*3/uL (ref 3.9–10.3)
nRBC: 0 % (ref 0–0)

## 2011-01-26 ENCOUNTER — Other Ambulatory Visit (HOSPITAL_COMMUNITY): Payer: Self-pay | Admitting: Hematology and Oncology

## 2011-01-27 ENCOUNTER — Encounter (HOSPITAL_BASED_OUTPATIENT_CLINIC_OR_DEPARTMENT_OTHER): Payer: Medicaid Other | Admitting: Hematology and Oncology

## 2011-01-27 DIAGNOSIS — C187 Malignant neoplasm of sigmoid colon: Secondary | ICD-10-CM

## 2011-02-02 ENCOUNTER — Ambulatory Visit (INDEPENDENT_AMBULATORY_CARE_PROVIDER_SITE_OTHER): Payer: Self-pay | Admitting: Obstetrics & Gynecology

## 2011-02-02 VITALS — BP 125/76 | HR 91

## 2011-02-02 DIAGNOSIS — IMO0001 Reserved for inherently not codable concepts without codable children: Secondary | ICD-10-CM

## 2011-02-02 DIAGNOSIS — Z3049 Encounter for surveillance of other contraceptives: Secondary | ICD-10-CM

## 2011-02-02 LAB — POCT PREGNANCY, URINE: Preg Test, Ur: NEGATIVE

## 2011-02-02 MED ORDER — MEDROXYPROGESTERONE ACETATE 150 MG/ML IM SUSP
150.0000 mg | Freq: Once | INTRAMUSCULAR | Status: AC
  Administered 2011-02-02: 150 mg via INTRAMUSCULAR

## 2011-02-02 NOTE — Progress Notes (Signed)
Pt was informed per Dr. Marice Potter that she will need to schedule an annual before she can receive her next Depo Provera shot since her last one was on 04/28/09.  Pt stated understanding to needing to schedule for an annual to receive the next Depo Provera.

## 2011-02-07 ENCOUNTER — Other Ambulatory Visit: Payer: Medicaid Other

## 2011-02-07 ENCOUNTER — Other Ambulatory Visit: Payer: Self-pay | Admitting: Hematology and Oncology

## 2011-02-07 ENCOUNTER — Ambulatory Visit (HOSPITAL_BASED_OUTPATIENT_CLINIC_OR_DEPARTMENT_OTHER): Payer: Medicaid Other | Admitting: Hematology and Oncology

## 2011-02-07 VITALS — BP 128/80 | HR 97 | Temp 98.3°F | Resp 20 | Wt 164.2 lb

## 2011-02-07 DIAGNOSIS — C187 Malignant neoplasm of sigmoid colon: Secondary | ICD-10-CM

## 2011-02-07 DIAGNOSIS — D696 Thrombocytopenia, unspecified: Secondary | ICD-10-CM

## 2011-02-07 DIAGNOSIS — C189 Malignant neoplasm of colon, unspecified: Secondary | ICD-10-CM

## 2011-02-07 LAB — CBC WITH DIFFERENTIAL/PLATELET
BASO%: 1.2 % (ref 0.0–2.0)
Basophils Absolute: 0.1 10*3/uL (ref 0.0–0.1)
EOS%: 2.8 % (ref 0.0–7.0)
HGB: 14.1 g/dL (ref 11.6–15.9)
MCH: 27.7 pg (ref 25.1–34.0)
MONO%: 5.4 % (ref 0.0–14.0)
RBC: 5.08 10*6/uL (ref 3.70–5.45)
RDW: 15.3 % — ABNORMAL HIGH (ref 11.2–14.5)
lymph#: 2.3 10*3/uL (ref 0.9–3.3)

## 2011-02-07 LAB — COMPREHENSIVE METABOLIC PANEL
AST: 18 U/L (ref 0–37)
Alkaline Phosphatase: 157 U/L — ABNORMAL HIGH (ref 39–117)
BUN: 8 mg/dL (ref 6–23)
Glucose, Bld: 89 mg/dL (ref 70–99)
Sodium: 137 mEq/L (ref 135–145)
Total Bilirubin: 0.3 mg/dL (ref 0.3–1.2)

## 2011-02-07 NOTE — Progress Notes (Signed)
This office note has been dictated.

## 2011-02-07 NOTE — Progress Notes (Signed)
CC:   Junious Dresser, MD [name]. Jarold Motto, MD, Caleen Essex, FAGA Veverly Fells Altheimer, M.D. Wilmon Arms. Tsuei, M.D.  IDENTIFYING STATEMENT:  The patient is a 35 year old woman with colon cancer who presents for followup.  INTERVAL HISTORY:  Ms. Niehaus is tolerating treatment at the current dose and schedule with very minimal difficulty.  She is enjoying good energy levels.  She denies motor or sensory neuropathy.  She denies diarrhea. She also denies nausea, vomiting or pain.  MEDICATIONS:  Documented and reviewed.  ALLERGIES:  Codeine sulfate, penicillin and sulfa.   Past medical history, surgical history and family history unchanged.  REVIEW OF SYSTEMS:  A 10 point review of systems negative.  PHYSICAL EXAMINATION:  General:  The patient is a well-appearing, well- nourished female in no distress.  Vitals:  Pulse 97, blood pressure 128/80, temperature 98.2, respirations 20, weight 164.2 pounds.  HEENT: Head is atraumatic, normocephalic.  Sclerae anicteric.  Mouth moist. Neck:  Supple.  Chest:  Clear.  CVS:  Unremarkable.  Abdomen:  Soft, nontender.  Bowel sounds present.  Extremities:  No edema.  No calf tenderness.  Lymph nodes:  No adenopathy.  CNS:  Nonfocal.  LAB DATA:  02/07/2011 white cell count is 9.6, hemoglobin 14.1, hematocrit 43, platelets 75.  Sodium 137, potassium 3.7, chloride 104, CO2 22, BUN 8, creatinine 0.71, glucose 89, T bilirubin 0.3, alkaline phosphatase 157, AST 18, ALT 19.  IMPRESSION AND PLAN:  Ms. 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 She is status post temporary ileostomy which was reversed on 10/04/2010. Began FOLFOX 6 on 11/07/2010.  Received 2 cycles but took a month off due to personal issues.  She resumed therapy and treatment currently dose reduced to 15% on account of persistent thrombocytopenia.   Her platelet count currently is 75,000 thus no changes were made to her schedule.  She follows up in 2 weeks' time.    ______________________________ Laurice Record, M.D. LIO/MEDQ  D:  02/07/2011  T:  02/07/2011  Job:  [id number]

## 2011-02-08 ENCOUNTER — Ambulatory Visit (HOSPITAL_BASED_OUTPATIENT_CLINIC_OR_DEPARTMENT_OTHER): Payer: Medicaid Other

## 2011-02-08 ENCOUNTER — Other Ambulatory Visit: Payer: Self-pay | Admitting: Hematology and Oncology

## 2011-02-08 ENCOUNTER — Telehealth: Payer: Self-pay | Admitting: *Deleted

## 2011-02-08 VITALS — BP 131/61 | HR 89 | Temp 97.3°F

## 2011-02-08 DIAGNOSIS — C189 Malignant neoplasm of colon, unspecified: Secondary | ICD-10-CM

## 2011-02-08 DIAGNOSIS — C187 Malignant neoplasm of sigmoid colon: Secondary | ICD-10-CM

## 2011-02-08 MED ORDER — SODIUM CHLORIDE 0.9 % IV SOLN: 2400.0000 mg/m2 | INTRAVENOUS | Status: DC

## 2011-02-08 MED ORDER — DEXAMETHASONE SODIUM PHOSPHATE 10 MG/ML IJ SOLN
10.0000 mg | Freq: Once | INTRAMUSCULAR | Status: AC
  Administered 2011-02-08: 10 mg via INTRAVENOUS

## 2011-02-08 MED ORDER — LEUCOVORIN CALCIUM INJECTION 350 MG: 400.0000 mg/m2 | Freq: Once | INTRAVENOUS | Status: DC

## 2011-02-08 MED ORDER — ONDANSETRON 8 MG/50ML IVPB (CHCC)
8.0000 mg | Freq: Once | INTRAVENOUS | Status: AC
  Administered 2011-02-08: 8 mg via INTRAVENOUS

## 2011-02-08 MED ORDER — FLUOROURACIL CHEMO INJECTION 2.5 GM/50ML: 400.0000 mg/m2 | Freq: Once | INTRAVENOUS | Status: DC

## 2011-02-08 MED ORDER — OXALIPLATIN CHEMO INJECTION 100 MG/20ML: 85.0000 mg/m2 | Freq: Once | INTRAVENOUS | Status: DC

## 2011-02-08 MED ORDER — DEXTROSE 5 % IV SOLN
Freq: Once | INTRAVENOUS | Status: AC
  Administered 2011-02-08: 11:00:00 via INTRAVENOUS

## 2011-02-08 NOTE — Progress Notes (Unsigned)
02/08/11 1125- Pt was seen by Dr Dalene Carrow yesterday.  MD aware of plt count 75k and proceeding with chemotherapy today.  Pt aware of thrombocytopenia precautions.  Left chest PAC accessed with 20 gauge 1 inch non coring huber needle.  PAC flushing well with brisk blood return.  Pt tolerated well.   02/08/11 1210- Pt states that she is unable to stay for treatment today due to having to pick her kids for a dentist appointment today.  Left chest PAC flushed and secured per protocol.  Pt will return tomorrow AM for treatment.

## 2011-02-09 ENCOUNTER — Other Ambulatory Visit: Payer: Self-pay | Admitting: Hematology and Oncology

## 2011-02-09 ENCOUNTER — Ambulatory Visit (HOSPITAL_BASED_OUTPATIENT_CLINIC_OR_DEPARTMENT_OTHER): Payer: Medicaid Other

## 2011-02-09 DIAGNOSIS — Z5111 Encounter for antineoplastic chemotherapy: Secondary | ICD-10-CM

## 2011-02-09 DIAGNOSIS — C189 Malignant neoplasm of colon, unspecified: Secondary | ICD-10-CM

## 2011-02-09 DIAGNOSIS — C187 Malignant neoplasm of sigmoid colon: Secondary | ICD-10-CM

## 2011-02-09 MED ORDER — FLUOROURACIL CHEMO INJECTION 500 MG/10ML
270.0000 mg/m2 | Freq: Once | INTRAVENOUS | Status: AC
  Administered 2011-02-09: 500 mg via INTRAVENOUS
  Filled 2011-02-09: qty 10

## 2011-02-09 MED ORDER — DEXTROSE 5 % IV SOLN
Freq: Once | INTRAVENOUS | Status: AC
  Administered 2011-02-09: 10:00:00 via INTRAVENOUS

## 2011-02-09 MED ORDER — OXALIPLATIN CHEMO INJECTION 100 MG/20ML
56.0000 mg/m2 | Freq: Once | INTRAVENOUS | Status: AC
  Administered 2011-02-09: 100 mg via INTRAVENOUS
  Filled 2011-02-09: qty 20

## 2011-02-09 MED ORDER — SODIUM CHLORIDE 0.9 % IJ SOLN
10.0000 mL | INTRAMUSCULAR | Status: DC | PRN
  Filled 2011-02-09: qty 10

## 2011-02-09 MED ORDER — ONDANSETRON 8 MG/50ML IVPB (CHCC)
8.0000 mg | Freq: Once | INTRAVENOUS | Status: AC
  Administered 2011-02-09: 8 mg via INTRAVENOUS

## 2011-02-09 MED ORDER — SODIUM CHLORIDE 0.9 % IJ SOLN
3.0000 mL | INTRAMUSCULAR | Status: DC | PRN
  Filled 2011-02-09: qty 10

## 2011-02-09 MED ORDER — DEXAMETHASONE SODIUM PHOSPHATE 10 MG/ML IJ SOLN
10.0000 mg | Freq: Once | INTRAMUSCULAR | Status: AC
  Administered 2011-02-09: 10 mg via INTRAVENOUS

## 2011-02-09 MED ORDER — LEUCOVORIN CALCIUM INJECTION 350 MG
270.0000 mg/m2 | Freq: Once | INTRAVENOUS | Status: AC
  Administered 2011-02-09: 492 mg via INTRAVENOUS
  Filled 2011-02-09: qty 24.6

## 2011-02-09 MED ORDER — HEPARIN SOD (PORK) LOCK FLUSH 100 UNIT/ML IV SOLN
500.0000 [IU] | Freq: Once | INTRAVENOUS | Status: DC | PRN
  Filled 2011-02-09: qty 5

## 2011-02-09 MED ORDER — HEPARIN SOD (PORK) LOCK FLUSH 100 UNIT/ML IV SOLN
250.0000 [IU] | Freq: Once | INTRAVENOUS | Status: DC | PRN
  Filled 2011-02-09: qty 5

## 2011-02-09 MED ORDER — SODIUM CHLORIDE 0.9 % IV SOLN
1640.0000 mg/m2 | INTRAVENOUS | Status: DC
  Administered 2011-02-09: 3000 mg via INTRAVENOUS
  Filled 2011-02-09: qty 60

## 2011-02-10 ENCOUNTER — Ambulatory Visit (HOSPITAL_BASED_OUTPATIENT_CLINIC_OR_DEPARTMENT_OTHER): Payer: Medicaid Other

## 2011-02-10 ENCOUNTER — Other Ambulatory Visit: Payer: Self-pay | Admitting: Hematology and Oncology

## 2011-02-10 VITALS — BP 147/68 | HR 95 | Temp 97.1°F

## 2011-02-10 DIAGNOSIS — C187 Malignant neoplasm of sigmoid colon: Secondary | ICD-10-CM

## 2011-02-11 MED ORDER — PEGFILGRASTIM INJECTION 6 MG/0.6ML
6.0000 mg | Freq: Once | SUBCUTANEOUS | Status: AC
Start: 2011-02-11 — End: 2011-02-11
  Administered 2011-02-11: 6 mg via SUBCUTANEOUS

## 2011-02-11 MED ORDER — HEPARIN SOD (PORK) LOCK FLUSH 100 UNIT/ML IV SOLN
500.0000 [IU] | Freq: Once | INTRAVENOUS | Status: AC | PRN
  Administered 2011-02-11: 500 [IU]
  Filled 2011-02-11: qty 5

## 2011-02-11 MED ORDER — SODIUM CHLORIDE 0.9 % IJ SOLN
10.0000 mL | INTRAMUSCULAR | Status: DC | PRN
  Administered 2011-02-11: 10 mL
  Filled 2011-02-11: qty 10

## 2011-02-17 ENCOUNTER — Ambulatory Visit (HOSPITAL_BASED_OUTPATIENT_CLINIC_OR_DEPARTMENT_OTHER): Payer: Medicaid Other | Admitting: Physician Assistant

## 2011-02-17 ENCOUNTER — Other Ambulatory Visit: Payer: Self-pay | Admitting: Physician Assistant

## 2011-02-17 ENCOUNTER — Telehealth: Payer: Self-pay | Admitting: Hematology and Oncology

## 2011-02-17 VITALS — BP 128/77 | HR 98 | Temp 97.2°F | Ht 67.0 in | Wt 165.9 lb

## 2011-02-17 DIAGNOSIS — D696 Thrombocytopenia, unspecified: Secondary | ICD-10-CM

## 2011-02-17 DIAGNOSIS — C187 Malignant neoplasm of sigmoid colon: Secondary | ICD-10-CM

## 2011-02-17 DIAGNOSIS — R5381 Other malaise: Secondary | ICD-10-CM

## 2011-02-17 DIAGNOSIS — C189 Malignant neoplasm of colon, unspecified: Secondary | ICD-10-CM

## 2011-02-17 DIAGNOSIS — F430 Acute stress reaction: Secondary | ICD-10-CM

## 2011-02-17 LAB — CBC WITH DIFFERENTIAL/PLATELET
BASO%: 0.5 % (ref 0.0–2.0)
Eosinophils Absolute: 0.2 10*3/uL (ref 0.0–0.5)
MCHC: 33 g/dL (ref 31.5–36.0)
MONO#: 0.8 10*3/uL (ref 0.1–0.9)
NEUT#: 15.5 10*3/uL — ABNORMAL HIGH (ref 1.5–6.5)
Platelets: 58 10*3/uL — ABNORMAL LOW (ref 145–400)
RBC: 5.22 10*6/uL (ref 3.70–5.45)
RDW: 15.9 % — ABNORMAL HIGH (ref 11.2–14.5)
WBC: 19 10*3/uL — ABNORMAL HIGH (ref 3.9–10.3)
lymph#: 2.4 10*3/uL (ref 0.9–3.3)

## 2011-02-17 LAB — COMPREHENSIVE METABOLIC PANEL
ALT: 19 U/L (ref 0–35)
Albumin: 4.6 g/dL (ref 3.5–5.2)
CO2: 26 mEq/L (ref 19–32)
Chloride: 107 mEq/L (ref 96–112)
Glucose, Bld: 84 mg/dL (ref 70–99)
Potassium: 4.1 mEq/L (ref 3.5–5.3)
Sodium: 141 mEq/L (ref 135–145)
Total Protein: 7 g/dL (ref 6.0–8.3)

## 2011-02-17 NOTE — Progress Notes (Signed)
This office note has been dictated.

## 2011-02-17 NOTE — Progress Notes (Signed)
CC:   Junious Dresser, MD [name]. Jarold Motto, MD, Caleen Essex, FAGA Veverly Fells Altheimer, M.D. Wilmon Arms. Tsuei, M.D.  IDENTIFYING STATEMENT:  [name] is a 35 year old white female with well differentiated adenocarcinoma of the colon who presents for followup.  INTERIM HISTORY:  [name] reports since her last clinic visit on 02/07/2011 she received chemotherapy on 02/08/2011.  She does report some fatigue but she is able to complete ADLs and she states she has just been under a lot of stress lately due to some family dynamic issues and financial constraints.  She has had no fevers, chills or night sweats.  No problems with dyspnea.  She reports that she does continue to smoke and it is difficult for her to try to cut back right now due to stressors.  She has had no problems with anorexia.  She does note intermittent nausea usually at the time of her pump disconnect and this is well-controlled with antiemetics.  She is not experiencing any problems with constipation or rectal bleeding.  She does note some loose stools for a few days after chemotherapy and she takes Imodium for this. She has had no dysuria, frequency or hematuria.  No swelling of extremities.  She does report some numbness and tingling in her hands and feet, but this has not impacted her ability to complete ADLs.  CURRENT MEDICATIONS:  Reviewed and recorded.  PHYSICAL EXAMINATION:  Vital signs:  Temperature is 97.2, heart rate 98, respirations 20, blood pressure 128/77, weight 165.9 pounds.  General: This is a well-developed, well-nourished white female, in no acute distress. HEENT:  Sclerae nonicteric.  There is no oral thrush or mucositis.  Skin:  No rashes or lesions.  Lymphs:  No cervical, supraclavicular, axillary or inguinal lymphadenopathy.  Cardiac: Regular rate and rhythm without murmurs or gallops.  Peripheral pulses are palpable.  There is a left subclavian Port-A-Cath without signs of infection.   Chest:  Lungs clear to auscultation.  Abdomen:  Positive bowel sounds.  Soft, nontender, nondistended.  No organomegaly. Extremities:  No edema, cyanosis or calf tenderness.  Neuro:  Alert and oriented times 3.  Strength, sensation and coordination all grossly intact.  LABS:  CBC with diff reveals WBC 19.0, Hgb 14.7, Hct 44.5, Plt 58 CMET pending at the time of this dictation.  IMPRESSION AND PLAN: 1. [name] is a 35 year old white female who is status post     laparoscopic assisted sigmoid colectomy on 08/05/2010 for a 5 cm     well differentiated adenocarcinoma with 1/30 positive lymph nodes.     She had temporary ileostomy which was reversed on 07/05/2010 and     she began adjuvant chemotherapy with FOLFOX 6 on 11/16/2010.  She     did require dose reduction with cycle 2 by 15%due to     thrombocytopenia.  She received a further dose reduction by 20%     with her cycle on 01/21/2011 due to ongoing issues with     thrombocytopenia.  She was last treated on 02/08/2011 and is due     for her next treatment on 02/22/2011. Due to patient's thrombocytopenia on      todays labs, we will re-assess cbc/diff on 02/22/11 prior to chemotherapy.       Reinforced teaching of     treatment, potential side effects and management of side effects     with emphasis on potential for fatigue, myelosuppression,     mucositis, nausea, vomiting, diarrhea, hand-foot syndrome,  peripheral neuropathy, cold sensitivity and arthralgias and the     patient verbalized understanding. 2. The patient will be scheduled for followup with Dr. Dalene Carrow 2 weeks     after her chemotherapy at which time     we will reassess CBC with diff and CMET.  We anticipate her     receiving additional chemotherapy the day after MD visit.  She is     advised to call in the interim if any questions or problems.    ______________________________ Sherilyn Banker, MSN, ANP, BC RJ/MEDQ  D:  02/17/2011  T:  02/17/2011  Job:   [id number]

## 2011-02-17 NOTE — Telephone Encounter (Signed)
Added lb to 11/21 chemo. lmonvm for pt informing her of new time for 11/21 @ 8:15 am.

## 2011-02-22 ENCOUNTER — Other Ambulatory Visit: Payer: Self-pay | Admitting: Hematology and Oncology

## 2011-02-22 ENCOUNTER — Other Ambulatory Visit: Payer: Self-pay | Admitting: Internal Medicine

## 2011-02-22 ENCOUNTER — Other Ambulatory Visit: Payer: Medicaid Other | Admitting: Lab

## 2011-02-22 ENCOUNTER — Ambulatory Visit: Payer: Medicaid Other

## 2011-02-22 ENCOUNTER — Ambulatory Visit (HOSPITAL_BASED_OUTPATIENT_CLINIC_OR_DEPARTMENT_OTHER): Payer: Medicaid Other

## 2011-02-22 DIAGNOSIS — E039 Hypothyroidism, unspecified: Secondary | ICD-10-CM

## 2011-02-22 DIAGNOSIS — C187 Malignant neoplasm of sigmoid colon: Secondary | ICD-10-CM

## 2011-02-22 DIAGNOSIS — C189 Malignant neoplasm of colon, unspecified: Secondary | ICD-10-CM

## 2011-02-22 DIAGNOSIS — Z932 Ileostomy status: Secondary | ICD-10-CM

## 2011-02-22 LAB — CBC WITH DIFFERENTIAL/PLATELET
BASO%: 0.1 % (ref 0.0–2.0)
LYMPH%: 22.4 % (ref 14.0–49.7)
MCHC: 32.9 g/dL (ref 31.5–36.0)
MCV: 83.7 fL (ref 79.5–101.0)
MONO%: 5.6 % (ref 0.0–14.0)
NEUT#: 5.3 10*3/uL (ref 1.5–6.5)
Platelets: 65 10*3/uL — ABNORMAL LOW (ref 145–400)
RBC: 5.15 10*6/uL (ref 3.70–5.45)
RDW: 15.5 % — ABNORMAL HIGH (ref 11.2–14.5)
WBC: 7.7 10*3/uL (ref 3.9–10.3)

## 2011-02-22 LAB — COMPREHENSIVE METABOLIC PANEL
ALT: 17 U/L (ref 0–35)
AST: 19 U/L (ref 0–37)
Albumin: 4.2 g/dL (ref 3.5–5.2)
Alkaline Phosphatase: 146 U/L — ABNORMAL HIGH (ref 39–117)
Potassium: 4 mEq/L (ref 3.5–5.3)
Sodium: 139 mEq/L (ref 135–145)
Total Bilirubin: 0.7 mg/dL (ref 0.3–1.2)
Total Protein: 6.4 g/dL (ref 6.0–8.3)

## 2011-02-22 NOTE — Progress Notes (Signed)
Patient refused treatment due to her platelets being 65.  Order also received from Dr. Arbutus Ped to cancel treatment due to blood counts.

## 2011-03-04 ENCOUNTER — Encounter: Payer: Self-pay | Admitting: *Deleted

## 2011-03-07 ENCOUNTER — Telehealth: Payer: Self-pay | Admitting: Hematology and Oncology

## 2011-03-07 ENCOUNTER — Other Ambulatory Visit: Payer: Medicaid Other | Admitting: Lab

## 2011-03-07 ENCOUNTER — Encounter: Payer: Self-pay | Admitting: *Deleted

## 2011-03-07 ENCOUNTER — Other Ambulatory Visit: Payer: Self-pay | Admitting: Hematology and Oncology

## 2011-03-07 ENCOUNTER — Ambulatory Visit (HOSPITAL_BASED_OUTPATIENT_CLINIC_OR_DEPARTMENT_OTHER): Payer: Medicaid Other | Admitting: Hematology and Oncology

## 2011-03-07 VITALS — BP 131/73 | HR 100 | Temp 97.6°F | Ht 67.0 in | Wt 172.8 lb

## 2011-03-07 DIAGNOSIS — D696 Thrombocytopenia, unspecified: Secondary | ICD-10-CM

## 2011-03-07 DIAGNOSIS — R5381 Other malaise: Secondary | ICD-10-CM

## 2011-03-07 DIAGNOSIS — C187 Malignant neoplasm of sigmoid colon: Secondary | ICD-10-CM

## 2011-03-07 DIAGNOSIS — C189 Malignant neoplasm of colon, unspecified: Secondary | ICD-10-CM

## 2011-03-07 DIAGNOSIS — C50919 Malignant neoplasm of unspecified site of unspecified female breast: Secondary | ICD-10-CM

## 2011-03-07 LAB — CBC WITH DIFFERENTIAL/PLATELET
EOS%: 6.2 % (ref 0.0–7.0)
Eosinophils Absolute: 0.4 10*3/uL (ref 0.0–0.5)
LYMPH%: 33.1 % (ref 14.0–49.7)
MCH: 29.1 pg (ref 25.1–34.0)
MCHC: 33.8 g/dL (ref 31.5–36.0)
MCV: 86.2 fL (ref 79.5–101.0)
MONO%: 9.4 % (ref 0.0–14.0)
NEUT#: 2.9 10*3/uL (ref 1.5–6.5)
Platelets: 61 10*3/uL — ABNORMAL LOW (ref 145–400)
RBC: 4.53 10*6/uL (ref 3.70–5.45)

## 2011-03-07 LAB — COMPREHENSIVE METABOLIC PANEL
ALT: 13 U/L (ref 0–35)
BUN: 6 mg/dL (ref 6–23)
CO2: 23 mEq/L (ref 19–32)
Calcium: 8.5 mg/dL (ref 8.4–10.5)
Chloride: 109 mEq/L (ref 96–112)
Creatinine, Ser: 0.81 mg/dL (ref 0.50–1.10)
Glucose, Bld: 80 mg/dL (ref 70–99)
Potassium: 3.7 mEq/L (ref 3.5–5.3)
Total Protein: 6.2 g/dL (ref 6.0–8.3)

## 2011-03-07 NOTE — Progress Notes (Signed)
Clinical Social Work was referred for housing assistance. CSW met with pt in exam room and pt shares she was living with her stepfather, boyfriend, and 3 children. Pt states her stepfather did not pay the rent and they are now being evicted. Pt states she broke up with her boyfriend because he was not contributing to the household. Pt states she has attempted to find permanent housing for her and her children but is unable to find housing they can afford at this time. CSW suggested family shelters for emergency assistance, but pt refused and stated she had bad experiences with [employer] in the past. CSW called Abigail Miyamoto, housing coordinator at the [employer] (day shelter) requesting she meet with pt to determine housing options. CSW provided pt with information on [employer] and Teresa Hick's contact information. The pt states they are able to stay in the apartment for a few days and then plan to stay with friends if necessary. CSW encouraged pt to follow up with the [facility] or contact her for other needs. Kathrin Penner, LCSWA

## 2011-03-07 NOTE — Telephone Encounter (Signed)
cx'd 12/5 chemo and moved chemo to thursdays starting 12/20 per LO. gv pt appt schedule for dec/jan

## 2011-03-07 NOTE — Progress Notes (Signed)
CC:   Junious Dresser, MD [name]. Jarold Motto, MD, Caleen Essex, FAGA Veverly Fells Altheimer, M.D. Wilmon Arms. Tsuei, M.D.  IDENTIFYING STATEMENT:  Ms. Friel is a 35 year old woman with colon cancer who presents for followup prior to chemotherapy.  INTERIM HISTORY:  Ms. Notaro had 6th cycle of therapy delayed on account of thrombocytopenia.  Ms. Yorio has a number of external stressors.  She is trying to move.  She notes fatigue a few days after therapy.  Denies sensory or motor neuropathy.  Denies nausea or vomiting.  Denies petechiae.  Denies overt bleeding.  MEDICATIONS:  Reviewed and updated.  ALLERGIES:  Codeine, penicillin and sulfa.  PAST MEDICAL HISTORY:  Unchanged.  FAMILY HISTORY:  Unchanged.  SOCIAL HISTORY:  Unchanged.  REVIEW OF SYSTEMS:  Ten-point review of systems negative.  PHYSICAL EXAM:  General:  The patient is alert and oriented x3.  Vitals: Pulse 100, blood pressure 131/73, temperature 97.6, respirations 20, weight 172 (165) pounds.  HEENT:  Head is atraumatic, normocephalic. Sclerae anicteric.  Mouth moist.  Neck:  Supple.  Chest:  Clear.  Port-A- Cath without infection.  Abdomen:  Soft, nontender.  Bowel sounds present.  Extremities:  No edema.  No calf tenderness.  CNS:  Nonfocal. Lymph nodes:  No adenopathy.  LAB DATA:  On 03/07/2011, white cell count 5.8, hemoglobin 13.2, hematocrit 39, platelets 61 (65).  C-MET pending.  IMPRESSION AND PLAN:  Ms. 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 She is status post temporary ileostomy which was reversed on 10/04/2010. She began FOLFOX6 on 11/07/2010 and has treatment breaks due to both personal issues and thrombocytopenia.  She has also had a dose reduction to 15% due to the latter.  Her therapy was delayed cycle 6 due to platelet counts of 65,000.  Her current CBC notes a  slightly lower platelet count.  Thus, we will continue to delay therapy for at least 2 weeks.  She follows up next week with a complete blood count.  If platelets are above 70,000, we will resume therapy with likely further dose reduction or hold oxaliplatin. Following her sixth cycle, we plan CT scan of the chest, abdomen, and pelvis.    ______________________________ Laurice Record, M.D. LIO/MEDQ  D:  03/07/2011  T:  03/07/2011  Job:  [id number]

## 2011-03-07 NOTE — Progress Notes (Signed)
This office note has been dictated.

## 2011-03-08 ENCOUNTER — Ambulatory Visit: Payer: Self-pay

## 2011-03-17 ENCOUNTER — Ambulatory Visit: Payer: Medicaid Other | Admitting: Hematology and Oncology

## 2011-03-17 ENCOUNTER — Other Ambulatory Visit: Payer: Medicaid Other

## 2011-03-17 ENCOUNTER — Telehealth: Payer: Self-pay | Admitting: Hematology and Oncology

## 2011-03-17 NOTE — Telephone Encounter (Signed)
called pt and informed her appts for 03/20/2011

## 2011-03-20 ENCOUNTER — Ambulatory Visit (HOSPITAL_BASED_OUTPATIENT_CLINIC_OR_DEPARTMENT_OTHER): Payer: Medicaid Other | Admitting: Physician Assistant

## 2011-03-20 ENCOUNTER — Other Ambulatory Visit: Payer: Self-pay | Admitting: Hematology and Oncology

## 2011-03-20 ENCOUNTER — Other Ambulatory Visit: Payer: Self-pay | Admitting: Physician Assistant

## 2011-03-20 ENCOUNTER — Other Ambulatory Visit (HOSPITAL_BASED_OUTPATIENT_CLINIC_OR_DEPARTMENT_OTHER): Payer: Medicaid Other | Admitting: Lab

## 2011-03-20 ENCOUNTER — Other Ambulatory Visit: Payer: Self-pay | Admitting: Oncology

## 2011-03-20 VITALS — BP 119/73 | HR 88 | Temp 98.1°F | Ht 67.0 in | Wt 177.4 lb

## 2011-03-20 DIAGNOSIS — C189 Malignant neoplasm of colon, unspecified: Secondary | ICD-10-CM

## 2011-03-20 DIAGNOSIS — Z9889 Other specified postprocedural states: Secondary | ICD-10-CM

## 2011-03-20 DIAGNOSIS — Z09 Encounter for follow-up examination after completed treatment for conditions other than malignant neoplasm: Secondary | ICD-10-CM

## 2011-03-20 LAB — CBC WITH DIFFERENTIAL/PLATELET
Basophils Absolute: 0 10*3/uL (ref 0.0–0.1)
HCT: 42.8 % (ref 34.8–46.6)
HGB: 14.3 g/dL (ref 11.6–15.9)
MONO#: 0.5 10*3/uL (ref 0.1–0.9)
NEUT#: 3.6 10*3/uL (ref 1.5–6.5)
NEUT%: 54.3 % (ref 38.4–76.8)
RDW: 14.7 % — ABNORMAL HIGH (ref 11.2–14.5)
WBC: 6.6 10*3/uL (ref 3.9–10.3)
lymph#: 2 10*3/uL (ref 0.9–3.3)

## 2011-03-20 LAB — COMPREHENSIVE METABOLIC PANEL
ALT: 13 U/L (ref 0–35)
Albumin: 4.1 g/dL (ref 3.5–5.2)
BUN: 9 mg/dL (ref 6–23)
CO2: 26 mEq/L (ref 19–32)
Calcium: 9.1 mg/dL (ref 8.4–10.5)
Chloride: 108 mEq/L (ref 96–112)
Creatinine, Ser: 0.83 mg/dL (ref 0.50–1.10)
Potassium: 4.2 mEq/L (ref 3.5–5.3)

## 2011-03-20 MED ORDER — PEGFILGRASTIM INJECTION 6 MG/0.6ML: 6.0000 mg | Freq: Once | SUBCUTANEOUS | Status: DC

## 2011-03-20 NOTE — Progress Notes (Signed)
CC:   Junious Dresser, MD [name]. Jarold Motto, MD, Caleen Essex, FAGA Veverly Fells Altheimer, M.D. Wilmon Arms. Tsuei, M.D.  IDENTIFYING STATEMENT:  Ms. [name] is a 35 year old white female with colon cancer who presents for followup prior to her next cycle of Chemotherapy with Folfox 6 with neulasta support..  INTERIM HISTORY:  Ms. Gigante states since her last clinic visit 2 weeks ago she has had some fatigue, but no difficulty completing ADLs.  She has had no fevers, chills, or night sweats.  No dyspnea or cough.  She reports normal appetite and has had no problems with nausea, vomiting, constipation, or diarrhea.  No dysuria, no frequency, or hematuria.  No alteration in sensation or balance or swelling of extremities.  No pain or tenderness at her Port-A-Cath site.  Current medications are reviewed and recorded.  PHYSICAL EXAMINATION:  Temperature is 98.1, heart rate 88, respirations 20, blood pressure 119/73, weight 177.4 pounds.  General:  This is a well-developed, well-nourished white female in no acute distress. HEENT:  Sclerae nonicteric.  There is no oral thrush or mucositis. Skin:  Without rashes or lesions.  Lymph:  No cervical, supraclavicular, axillary, or inguinal lymphadenopathy.  Cardiac:  Regular rate and rhythm without murmurs or gallops.  Peripheral pulses are 2+.  There is a left subclavian Port-A-Cath without signs of infection.  Chest:  Lungs Was clear to auscultation.  Abdomen:  Positive bowel sounds, soft, nontender, nondistended.  No organomegaly.  Extremities:  No edema, cyanosis or calf tenderness.  Neurologic:  Alert and oriented x3. Strength, sensation, and coordination all grossly intact.  LABORATORY DATA:  CBC with diff reveals white blood count of 6.6, hemoglobin 14.3, hematocrit of 42.8, platelets of 82, ANC of 3.6 and MCV of 85.3.  CMET is currently pending.  IMPRESSION/PLAN:  Ms [name] is a 35 year old white female who is status post  laparoscopic-assisted sigmoid colectomy on Aug 05, 2010 for a 5 cm well-differentiated adenocarcinoma with 1/30 positive lymph nodes. She had temporary ileostomy which was reversed and she began adjuvant chemotherapy with FOLFOX 6 on November 16, 2010.  She did require dose reduction with cycle 2 by 15% due to thrombocytopenia.  She had an additional dose reduction by 20% with her cycle on January 21, 2011 due to ongoing issues with thrombocytopenia.  The patient has also required multiple treatment breaks.  She had treatments held with her last cycle due to thrombocytopenia and is now scheduled for her next treatment on March 23, 2011.  Her platelet count is above Dr. Lonell Face parameter of 70,000 at 82,000 today. Labs are reviewed with Dr. Arline Asp, as Dr. Dalene Carrow is out of the office today, and Dr. Arline Asp approved pt's chemotherapy without any further dose reductions.Reinforced teaching of treatment potential side effects and management of side effects with emphasis on potential for fatigue, myelosuppression, mucositis, nausea,vomiting, diarrhea, hand-foot syndrome, peripheral neuropathy, cold sensitivity, and arthralgias and patient verbalizes understanding.  The patient will be scheduled for followup with Dr. Dalene Carrow in 2 weeks time at which time we will reassess CBC with diff,CMET, and CEA. A few days before pt's follow visit, we will obtain a CT scan of the C/A/P. We anticipate patient receiving additional chemotherapy after MD visit.  The patient is advised to call in the interim if any questions or problems.    ______________________________ Sherilyn Banker, MSN, ANP, BC RJ/MEDQ  D:  03/20/2011  T:  03/20/2011  Job:  [id number]

## 2011-03-20 NOTE — Progress Notes (Signed)
Addended by: Amaryllis Dyke on: 03/20/2011 03:17 PM   Modules accepted: Orders

## 2011-03-20 NOTE — Progress Notes (Signed)
This office note has been dictated.

## 2011-03-22 ENCOUNTER — Ambulatory Visit: Payer: Self-pay

## 2011-03-23 ENCOUNTER — Ambulatory Visit: Payer: Self-pay

## 2011-03-27 ENCOUNTER — Telehealth: Payer: Self-pay | Admitting: Hematology and Oncology

## 2011-03-27 NOTE — Telephone Encounter (Signed)
lmonvm for pt re lb/ct for 12/28 and confirmed appts for 1/3 and 1/5. Pt made aware that i will contact her re a f/u appt. Emailed LO re d/t as there is no availability 1/2 and pt has tx 1/3.

## 2011-03-30 ENCOUNTER — Telehealth: Payer: Self-pay | Admitting: Hematology and Oncology

## 2011-03-30 NOTE — Telephone Encounter (Signed)
03/30/2011  [name] was scheduled to have a chest ct can on Friday, 03/31/2011.  This chest ct can as denied by Medicaid/[employer].  The abdomen/pelvis ct scan was approved.  I have made Dr. Lonell Face nurse aware of this decision.  The chest ct scan has been cancelled per the denial.  I will await Dr. Lonell Face decision as to rescheduling the chest ct scan.

## 2011-03-31 ENCOUNTER — Other Ambulatory Visit (HOSPITAL_COMMUNITY): Payer: Medicaid Other

## 2011-03-31 ENCOUNTER — Inpatient Hospital Stay (HOSPITAL_COMMUNITY)
Admission: RE | Admit: 2011-03-31 | Discharge: 2011-03-31 | Payer: Medicaid Other | Source: Ambulatory Visit | Attending: Physician Assistant | Admitting: Physician Assistant

## 2011-03-31 ENCOUNTER — Other Ambulatory Visit: Payer: Medicaid Other | Admitting: Lab

## 2011-04-05 ENCOUNTER — Other Ambulatory Visit: Payer: Self-pay | Admitting: Nurse Practitioner

## 2011-04-05 ENCOUNTER — Ambulatory Visit: Payer: Medicaid Other | Admitting: Physician Assistant

## 2011-04-06 ENCOUNTER — Ambulatory Visit: Payer: Medicaid Other

## 2011-04-06 ENCOUNTER — Telehealth: Payer: Self-pay | Admitting: Nurse Practitioner

## 2011-04-06 NOTE — Telephone Encounter (Signed)
Late entry for 04/05/11:  Noted pt missed CT scan and MD appointment.  Attempted to contact pt to inform she needs lab and MD appt before treatment scheduled for 04/06/11.  Left message requesting return call.

## 2011-04-27 ENCOUNTER — Ambulatory Visit: Payer: Medicaid Other | Admitting: Obstetrics and Gynecology

## 2012-01-10 ENCOUNTER — Telehealth: Payer: Self-pay | Admitting: Hematology and Oncology

## 2012-01-10 NOTE — Telephone Encounter (Signed)
Pt lmonvm today but did not leave message re what she needed. Returned call but was not able to reach pt. Lm for pt to call back.

## 2012-01-12 ENCOUNTER — Telehealth: Payer: Self-pay | Admitting: Hematology and Oncology

## 2012-01-12 NOTE — Telephone Encounter (Signed)
Received note from switchboard that pt called. No message in note. Pt called also prior to today and left a message w/just her name and phone. Pt has not specified in either message why she is calling, I have returned pt's call both times but have been unable to reach her. I did leave messages both times as call goes straight to vm. Pt is identified on vm. Message from switchboard today given to desk nurse.

## 2012-01-12 NOTE — Telephone Encounter (Signed)
Correction to previous phone note. Message re pt calls given to LO not desk nurse.

## 2012-01-25 ENCOUNTER — Other Ambulatory Visit: Payer: Self-pay | Admitting: *Deleted

## 2012-01-25 ENCOUNTER — Telehealth: Payer: Self-pay | Admitting: *Deleted

## 2012-01-25 DIAGNOSIS — C189 Malignant neoplasm of colon, unspecified: Secondary | ICD-10-CM

## 2012-01-25 NOTE — Telephone Encounter (Signed)
Received message from pt requesting an appt with md to complete her chemo regimen.   Pt last had chemo in October, 2012.  Pt had FTKA for f/u since Nov.2012.   MD notified. Called pt back today as per Lauree Chandler.   Per pt , she had not received treatment anywhere else since pt stopped coming to the cancer center.   Confirmed with pt that she intended to keep appts as scheduled, and the correct contact phone number.   Informed pt that a scheduler will contact pt with appts for lab and  CT scans , and  F/U appt with md.   Pt voiced understanding, and stated again that pt intended to keep appts as scheduled. Pt's   Phone    [phone].

## 2012-01-26 ENCOUNTER — Telehealth: Payer: Self-pay | Admitting: Hematology and Oncology

## 2012-01-26 NOTE — Telephone Encounter (Signed)
s/w pt and she is aware of her appts,she will p/u a sch and her contrast today    aom

## 2012-01-31 ENCOUNTER — Telehealth: Payer: Self-pay | Admitting: Nurse Practitioner

## 2012-01-31 IMAGING — CR DG CHEST 2V
2 series · 2 of 2 positions shown · non-contrast
Comparison: 08/16/2008

CLINICAL DATA: Preop evaluation for cholecystectomy, asthma,
chronic bronchitis

CHEST - 2 VIEW

[view not recorded (1 of 2)]
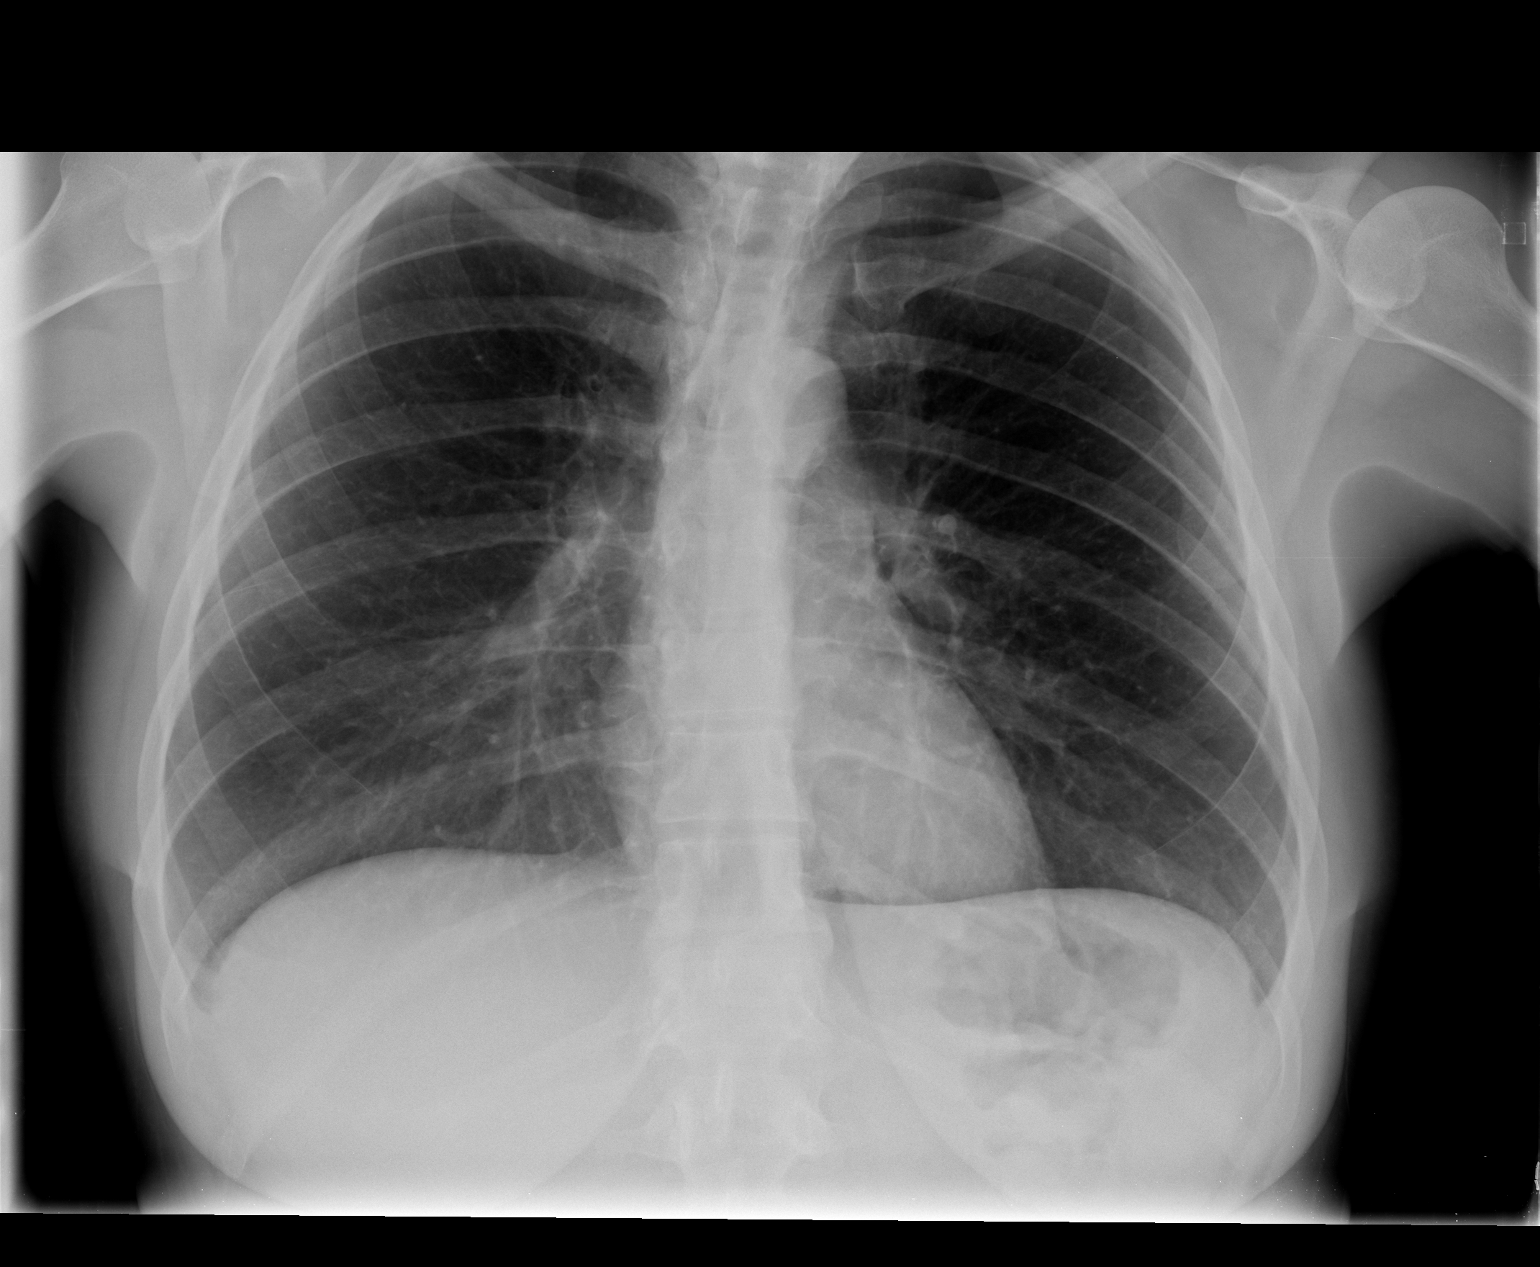

[view not recorded (2 of 2)]
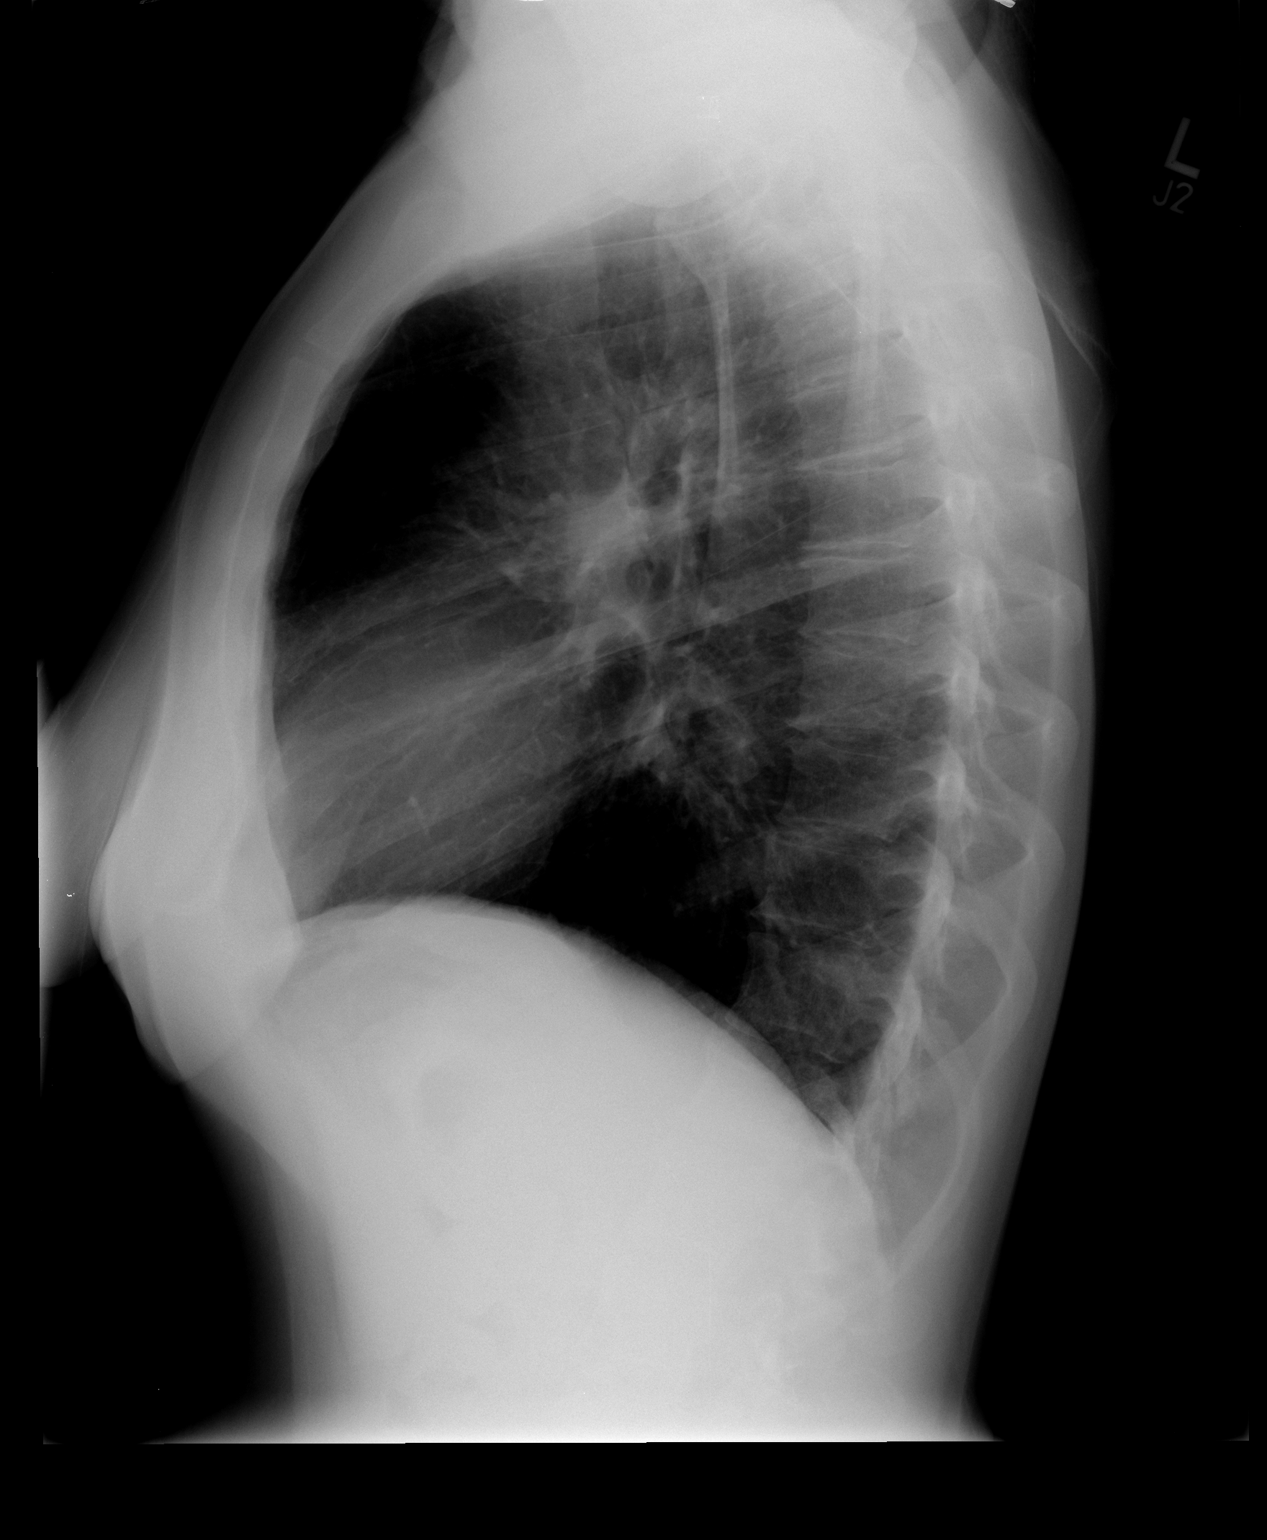

[2 of 2 positions shown; findings below may reference images not displayed]

FINDINGS: The heart size and mediastinal contours are within
normal limits.  Both lungs are clear.  The visualized skeletal
structures are unremarkable.
IMPRESSION: No active cardiopulmonary disease.

## 2012-01-31 NOTE — Telephone Encounter (Signed)
Error

## 2012-01-31 NOTE — Telephone Encounter (Signed)
Called patient- left message requesting a return call to this office.

## 2012-02-02 ENCOUNTER — Telehealth: Payer: Self-pay | Admitting: Nurse Practitioner

## 2012-02-02 DIAGNOSIS — C189 Malignant neoplasm of colon, unspecified: Secondary | ICD-10-CM

## 2012-02-02 NOTE — Telephone Encounter (Signed)
Called patient to inform- Dr. Dalene Carrow would like to obtain PET scan to further evaluate nodules on CT scan.  Left message. POF to schedulers.

## 2012-02-05 ENCOUNTER — Telehealth: Payer: Self-pay | Admitting: *Deleted

## 2012-02-05 NOTE — Telephone Encounter (Signed)
Left voice message to inform the patient of the pet scan that has been add on for 02-14-2012 8:00am starting with labs at the cancer center

## 2012-02-09 ENCOUNTER — Encounter: Payer: Self-pay | Admitting: Hematology and Oncology

## 2012-02-09 NOTE — Progress Notes (Signed)
02/09/2012  Good Afternoon,  [name] is scheduled to have a CHEST, ABD AND PELVIS CT SCANS AND A PET SCAN on Nov. 13, 2013.  [place] Medicaid has denied the PET SCAN citing it not to be a part of their ONCOLOGY IMAGING GUIDELINES.  I submitted VIA their web site and FAX the last two clinical notes along with the last imaging for this patient.  I have listed below the necessary information if you or your Nurse Practitioner would like to contest this decision.   CASE# [id number]  Opt.4  Would you like for this patient to have the CT SCANS done or to wait until the PET SCAN approved.  Bonita Quin [phone]

## 2012-02-13 ENCOUNTER — Other Ambulatory Visit: Payer: Self-pay

## 2012-02-13 DIAGNOSIS — C189 Malignant neoplasm of colon, unspecified: Secondary | ICD-10-CM

## 2012-02-14 ENCOUNTER — Ambulatory Visit (HOSPITAL_COMMUNITY): Payer: Medicaid Other

## 2012-02-14 ENCOUNTER — Other Ambulatory Visit: Payer: Medicaid Other

## 2012-02-14 ENCOUNTER — Encounter (HOSPITAL_COMMUNITY): Admission: RE | Admit: 2012-02-14 | Payer: Medicaid Other | Source: Ambulatory Visit

## 2012-02-14 ENCOUNTER — Ambulatory Visit (HOSPITAL_COMMUNITY)
Admission: RE | Admit: 2012-02-14 | Discharge: 2012-02-14 | Payer: Medicaid Other | Source: Ambulatory Visit | Attending: Hematology and Oncology | Admitting: Hematology and Oncology

## 2012-02-19 ENCOUNTER — Encounter (HOSPITAL_COMMUNITY)
Admission: RE | Admit: 2012-02-19 | Discharge: 2012-02-19 | Disposition: A | Discharge status: Home / Self Care | Payer: Medicaid Other | Source: Ambulatory Visit | Attending: Hematology and Oncology | Admitting: Hematology and Oncology

## 2012-02-19 ENCOUNTER — Other Ambulatory Visit (HOSPITAL_COMMUNITY): Payer: Medicaid Other

## 2012-02-19 ENCOUNTER — Other Ambulatory Visit: Payer: Medicaid Other

## 2012-02-19 DIAGNOSIS — C189 Malignant neoplasm of colon, unspecified: Secondary | ICD-10-CM | POA: Insufficient documentation

## 2012-02-19 DIAGNOSIS — R1900 Intra-abdominal and pelvic swelling, mass and lump, unspecified site: Secondary | ICD-10-CM | POA: Insufficient documentation

## 2012-02-19 DIAGNOSIS — Z981 Arthrodesis status: Secondary | ICD-10-CM | POA: Insufficient documentation

## 2012-02-19 DIAGNOSIS — R918 Other nonspecific abnormal finding of lung field: Secondary | ICD-10-CM | POA: Insufficient documentation

## 2012-02-19 LAB — GLUCOSE, CAPILLARY: Glucose-Capillary: 111 mg/dL — ABNORMAL HIGH (ref 70–99)

## 2012-02-19 MED ORDER — FLUDEOXYGLUCOSE F - 18 (FDG) INJECTION
16.2000 | Freq: Once | INTRAVENOUS | Status: AC | PRN
Start: 2012-02-19 — End: 2012-02-19
  Administered 2012-02-19: 16.2 via INTRAVENOUS

## 2012-02-21 ENCOUNTER — Encounter: Payer: Self-pay | Admitting: Hematology and Oncology

## 2012-02-21 ENCOUNTER — Other Ambulatory Visit: Payer: Self-pay

## 2012-02-21 ENCOUNTER — Other Ambulatory Visit (HOSPITAL_BASED_OUTPATIENT_CLINIC_OR_DEPARTMENT_OTHER): Payer: Medicaid Other

## 2012-02-21 ENCOUNTER — Ambulatory Visit (HOSPITAL_BASED_OUTPATIENT_CLINIC_OR_DEPARTMENT_OTHER): Payer: Medicaid Other | Admitting: Hematology and Oncology

## 2012-02-21 ENCOUNTER — Ambulatory Visit: Payer: Medicaid Other | Admitting: Hematology and Oncology

## 2012-02-21 VITALS — BP 120/73 | HR 124 | Temp 98.2°F | Resp 19 | Ht 67.0 in | Wt 152.0 lb

## 2012-02-21 DIAGNOSIS — C787 Secondary malignant neoplasm of liver and intrahepatic bile duct: Secondary | ICD-10-CM

## 2012-02-21 DIAGNOSIS — C189 Malignant neoplasm of colon, unspecified: Secondary | ICD-10-CM

## 2012-02-21 DIAGNOSIS — C187 Malignant neoplasm of sigmoid colon: Secondary | ICD-10-CM

## 2012-02-21 DIAGNOSIS — C7960 Secondary malignant neoplasm of unspecified ovary: Secondary | ICD-10-CM

## 2012-02-21 DIAGNOSIS — R911 Solitary pulmonary nodule: Secondary | ICD-10-CM

## 2012-02-21 LAB — CBC WITH DIFFERENTIAL/PLATELET
Basophils Absolute: 0 10*3/uL (ref 0.0–0.1)
EOS%: 7.9 % — ABNORMAL HIGH (ref 0.0–7.0)
HCT: 36.7 % (ref 34.8–46.6)
HGB: 12.3 g/dL (ref 11.6–15.9)
LYMPH%: 20.2 % (ref 14.0–49.7)
MCH: 26.9 pg (ref 25.1–34.0)
MCV: 80.5 fL (ref 79.5–101.0)
MONO%: 7.8 % (ref 0.0–14.0)
NEUT%: 63.8 % (ref 38.4–76.8)
RDW: 13.8 % (ref 11.2–14.5)

## 2012-02-21 LAB — COMPREHENSIVE METABOLIC PANEL (CC13)
Alkaline Phosphatase: 144 U/L (ref 40–150)
BUN: 8 mg/dL (ref 7.0–26.0)
Glucose: 102 mg/dl — ABNORMAL HIGH (ref 70–99)
Total Bilirubin: 0.39 mg/dL (ref 0.20–1.20)

## 2012-02-21 MED ORDER — DEXAMETHASONE 4 MG PO TABS: ORAL_TABLET | ORAL | Status: DC

## 2012-02-21 MED ORDER — LIDOCAINE-PRILOCAINE 2.5-2.5 % EX CREA: TOPICAL_CREAM | CUTANEOUS | Status: DC | PRN

## 2012-02-21 MED ORDER — ONDANSETRON HCL 8 MG PO TABS: ORAL_TABLET | ORAL | Status: DC

## 2012-02-21 NOTE — Telephone Encounter (Signed)
appts made and printed for pt  °

## 2012-02-21 NOTE — Telephone Encounter (Signed)
Spoke with Vada in pathology re:  Requested for KRAS testing to be done as per Dr. Lonell Face instructions.  Faxed request to [facility]   [id number].

## 2012-02-21 NOTE — Progress Notes (Signed)
This office note has been dictated.

## 2012-02-21 NOTE — Patient Instructions (Addendum)
[name]  [id number]   [facility] - AFTER VISIT SUMMARY   **RECOMMENDATIONS MADE BY THE CONSULTANT AND ANY TEST    RESULTS WILL BE SENT TO YOUR REFERRING DOCTORS.   YOUR EXAM FINDINGS, LABS AND RESULTS WERE DISCUSSED BY YOUR MD TODAY.  YOU CAN GO TO THE [facility] WEB SITE FOR INSTRUCTIONS ON HOW TO ASSESS MY CHART FOR ADDITIONAL INFORMATION AS NEEDED.  Your Updated drug allergies are: Allergies as of 02/21/2012 - Review Complete 02/21/2012  Allergen Reaction Noted  . Codeine Hives 05/06/2010  . Penicillins Hives 05/06/2010  . Sulfa antibiotics Hives 05/06/2010    Your current list of medications are: Current Outpatient Prescriptions  Medication Sig Dispense Refill  . albuterol (PROAIR HFA) 108 (90 BASE) MCG/ACT inhaler Inhale 2 puffs into the lungs every 4 (four) hours as needed.        Tery Sanfilippo Sodium (STOOL SOFTENER) 100 MG capsule Take 100 mg by mouth 3 (three) times daily as needed.        Marland Kitchen omeprazole (PRILOSEC) 20 MG capsule Take 20 mg by mouth daily.      . propranolol (INDERAL) 40 MG tablet Take 40 mg by mouth as needed.       Marland Kitchen oxyCODONE (OXY IR/ROXICODONE) 5 MG immediate release tablet Take 5 mg by mouth every 6 (six) hours as needed. Last dose  02/20/12.      Marland Kitchen propylthiouracil (PTU) 50 MG tablet Take 2 tabs by mouth 3 times a day.       Current Facility-Administered Medications  Medication Dose Route Frequency Provider Last Rate Last Dose  . 0.9 %  sodium chloride infusion  500 mL Intravenous Continuous Mardella Layman, MD         INSTRUCTIONS GIVEN AND DISCUSSED:  See attached schedule   SPECIAL INSTRUCTIONS/FOLLOW-UP:  See above.  I acknowledge that I have been informed and understand all the instructions given to me and received a copy.I know to contact the clinic, my physician, or go to the emergency Department if any problems should occur.   I do not have any more questions at this time, but understand that I may call the [facility] at [phone] during business hours should I have any further questions or need assistance in obtaining follow-up care.

## 2012-02-22 ENCOUNTER — Encounter: Payer: Self-pay | Admitting: *Deleted

## 2012-02-22 NOTE — Progress Notes (Signed)
CC:   Junious Dresser, MD [name]. Jarold Motto, MD, Clementeen Graham, FACP, FAGA Wilmon Arms. Tsuei, M.D.  IDENTIFYING STATEMENT:  The patient is a 36 year old woman with colon cancer who was lost to followup and presents to reestablish care.  INTERVAL HISTORY:  Ms. Huettner was last seen on March 20, 2011.  To summarize, she was diagnosed with node positive colon cancer.  She had undergone a laparoscopic-assisted sigmoid colectomy on Aug 05, 2010, for 5 cm well-differentiated adenocarcinoma with 1/30 positive lymph nodes. She had a temporary ileostomy, which was reversed, and she began adjuvant chemotherapy with FOLFOX6 on November 16, 2010.  She required dose reductions for thrombocytopenia and also multiple treatment breaks as she had periods of not showing up for therapy.  She received a total of 7 cycles of FOLFOX6, last treated on 03/07/2012.  After this, she was lost to followup. She now presents with what she describes as right lower quadrant abdominal pain which is cramping in nature.  This began a few months ago.  Pain have become more frequent.  She is moving her bowels.  She has no nausea, vomiting.  She has not lost weight.  She is presently very emotional as she states that she lost her father to suicide last spring.  She still lives in [place].  She had presented to the emergency room at [facility] and CT scan of the chest and abdomen was performed on January 27, 2012.  Essentially there were several pulmonary nodules within the chest and it is documented that they had not changed in size.  There was a 1.4 cm hypo-enhancing lesion in the right hepatic lobe.  Spleen, pancreas and adrenal glands were unremarkable.  There was no evidence of bowel obstruction.  There was some focal thickening in the cecum, which was highly suspicious.  Prior sigmoidectomy with anastomosis in the left pelvis was noted.  There was a 2.2 x 1.6 nodal metastasis in the lower abdominal mesentery  and additional small left iliac chain nodes measuring 8 mm.  There is a 1.5 x 2.6 cm nodal metastasis anterior to the distal sigmoid/rectum.  There was a 14 x 10.3 x 12 cm mixed density right adnexal lesion and a 3.8 x 2.6 left ovarian lesion.  The patient went on to have a PET scan on 02/19/2012.  The PET scan showed hypermetabolic activity associated with the right lower quadrant thickening with an SUV of 10.4.  There was also focal activity in the right hemithorax felt to be a hypermetabolic lesion in the dome of the right hepatic lobe concerning for solitary hepatic metastasis.  Focal activity at the area of the colonic anastomosis in the right quadrant concerning for focal recurrence. Multiple small pulmonary nodules were noted and very stable when compared to prior CT scan.  PAST MEDICAL HISTORY: 1. Stage III colon cancer, status post 7 cycles of FOLFOX6, did not     complete therapy, with history of noncompliance. 2. Hypothyroidism. 3. Asthma. 4. History of seizures. 5. Status post a right hand surgery. 6. GERD.  ALLERGIES:  Sulfa, codeine sulfate, penicillin.  MEDICATIONS:  Reviewed and updated.  SOCIAL HISTORY:  The patient is divorced with 3 children.  Smokes half a pack a day.  Denies alcohol use.  She is an unemployed Corporate investment banker.  FAMILY HISTORY:  The patient's paternal grandfather had colon cancer. Denies family history of breast cancer.  REVIEW OF SYSTEMS:  Denies fever, chills, night sweats.  Denies weight loss.  Denies anorexia.  Notes right  lower quadrant discomfort managed with pain medication.  Moving her bowels.  Denies rectal bleeding but has noted alternating constipation with diarrhea.  Denies chest pain, PND, orthopnea, ankle swelling.  Respiratory:  Denies cough, hemoptysis, wheeze, shortness of breath.  Skin:  No bruising or bleeding. Neurologic:  Denies headache, vision change, extremity weakness.  PHYSICAL EXAMINATION:  Patient is alert  and oriented x3.  Vitals:  Pulse 124, blood pressure 120/73, temperature 98.2, respirations 19, weight 152 pounds.  HEENT:  Head is atraumatic, normocephalic.  Sclerae anicteric.  Mouth:  No thrush.  Neck:  Supple.  Chest:  Notes a port. Abdomen:  Soft.  Slightly tender in the right lower quadrant with no rebound or guarding.  Bowel sounds present.  Extremities:  No calf tenderness.  CNS:  Nonfocal.  LABORATORY DATA:  02/21/2012: White cell count 8.3, hemoglobin 12.3, hematocrit 36.7, platelets 324.  CEA 42. CMET pending.  IMPRESSION AND PLAN:  Ms. Gobrecht is a 36 year old woman with progression of her KRAS mutant adenocarcinoma with disease to both ovaries and liver and  possible lung.  CT scans also indicate recurrence to her cecum. There is no  evidence of obstruction.  CEA is elevated.  The patient was lost to followup  after partially completing adjuvant therapy, receiving a total of 7 cycles of FOLFOX6 from November 06, 2010 through March 07, 2011.  It would be ideal to obtain  biopsies so will have Interventional Radiology obtain a CT-guided biopsy of liver mass.  Ultimately, she needs to begin chemotherapy as soon as possible.  I have elected to treat her with FOLFIRI and avastin.  She is keen to proceed as soon as possible.  We discussed the logistics with the side effects of therapy with emphasis of fatigue,  myelosuppression, mucositis, nausea, vomiting, diarrhea, hand-foot syndrome, arthralgias. The patient was encouraged to keep appointments and treatments.  Once she settles down, we may transition her to the [place] office as this is closer to home.    ______________________________ Laurice Record, M.D. LIO/MEDQ  D:  02/21/2012  T:  02/22/2012  Job:  [id number]

## 2012-02-22 NOTE — Progress Notes (Signed)
[facility] Clinical Social Work  Visual merchandiser met with pt in exam room at [facility] to offer support and assess for psychosocial needs.  Pt was tearful about information received today.  Using strengths based approach, CSW assisted pt in processing her emotions associated with the advance of her diagnosis.  CSW and pt also discussed the importance of continuing treatment and compliance with [facility] staff.  Pt was in agreement and verbalized understanding.  Pt expressed concerns for her 3 daughters, but stated they recently established stable housing which had been a stressor.  CSW will follow up with pt at her next visit, and encouraged her to call with any additional questions or concerns.    Tamala Julian, MSW, LCSW Clinical Social Worker [facility] [phone]

## 2012-02-23 ENCOUNTER — Telehealth: Payer: Self-pay | Admitting: *Deleted

## 2012-02-23 ENCOUNTER — Other Ambulatory Visit: Payer: Self-pay | Admitting: *Deleted

## 2012-02-23 NOTE — Telephone Encounter (Signed)
Spoke with Elnita Maxwell, radiology scheduler today.  Was informed that pt's CT biopsy case is being reviewed by radiologist.  Elnita Maxwell stated when biopsy is scheduled, pt will be notified by radiology of all necessary instructions.

## 2012-02-23 NOTE — Telephone Encounter (Signed)
Spoke with Tiffany in radiology and was informed re:  Dr. Deanne Coffer would like to discuss pt's situation with Dr. Dalene Carrow before biopsy is scheduled.  Informed Tiffany that md will not be back in office until  02/27/12.  Message left on md's desk. Dr.  Dereck Ligas  Pager    [phone].

## 2012-02-27 ENCOUNTER — Other Ambulatory Visit: Payer: Self-pay | Admitting: Hematology and Oncology

## 2012-02-27 ENCOUNTER — Telehealth: Payer: Self-pay | Admitting: Nurse Practitioner

## 2012-02-27 NOTE — Telephone Encounter (Signed)
Received call from patient reporting constipation and painful bowel movements.  States she is using OTC stool softener- Sennakot-S with results, however states- "I don't like using these laxatives".  RN reviewed with patient that Sennakot may be used up to two times a day safely.  Also instructed pt to drink extra water, gatorade, and other non-caffeinated fluids.  Eat prunes and Activia yogurt.  May also try Miralax OTC 17g daily.   Instructed patient if she experiences severe bowel pain, vomiting, and unable to pass stool she should call this office.  Pt also reports- "I have to really concentrate" to void her urine.  RN advised to increase fluid intake and will pass this information to Dr. Dalene Carrow.  Confirmed pt appointment for chemo tomorrow.

## 2012-02-28 ENCOUNTER — Other Ambulatory Visit (HOSPITAL_BASED_OUTPATIENT_CLINIC_OR_DEPARTMENT_OTHER): Payer: Medicaid Other | Admitting: Lab

## 2012-02-28 ENCOUNTER — Telehealth: Payer: Self-pay | Admitting: *Deleted

## 2012-02-28 ENCOUNTER — Other Ambulatory Visit: Payer: Self-pay | Admitting: *Deleted

## 2012-02-28 ENCOUNTER — Other Ambulatory Visit: Payer: Self-pay | Admitting: Medical Oncology

## 2012-02-28 ENCOUNTER — Ambulatory Visit (HOSPITAL_BASED_OUTPATIENT_CLINIC_OR_DEPARTMENT_OTHER): Payer: Medicaid Other

## 2012-02-28 VITALS — BP 123/70 | HR 92 | Temp 99.0°F

## 2012-02-28 DIAGNOSIS — C189 Malignant neoplasm of colon, unspecified: Secondary | ICD-10-CM

## 2012-02-28 DIAGNOSIS — R110 Nausea: Secondary | ICD-10-CM

## 2012-02-28 DIAGNOSIS — C187 Malignant neoplasm of sigmoid colon: Secondary | ICD-10-CM

## 2012-02-28 DIAGNOSIS — Z452 Encounter for adjustment and management of vascular access device: Secondary | ICD-10-CM

## 2012-02-28 DIAGNOSIS — Z5111 Encounter for antineoplastic chemotherapy: Secondary | ICD-10-CM

## 2012-02-28 LAB — CBC WITH DIFFERENTIAL/PLATELET
Basophils Absolute: 0 10*3/uL (ref 0.0–0.1)
Eosinophils Absolute: 0.6 10*3/uL — ABNORMAL HIGH (ref 0.0–0.5)
HGB: 12.5 g/dL (ref 11.6–15.9)
LYMPH%: 17 % (ref 14.0–49.7)
MCV: 80.8 fL (ref 79.5–101.0)
MONO#: 0.5 10*3/uL (ref 0.1–0.9)
MONO%: 5.5 % (ref 0.0–14.0)
NEUT#: 6.8 10*3/uL — ABNORMAL HIGH (ref 1.5–6.5)
Platelets: 397 10*3/uL (ref 145–400)
RDW: 14.4 % (ref 11.2–14.5)

## 2012-02-28 LAB — COMPREHENSIVE METABOLIC PANEL (CC13)
Albumin: 3.3 g/dL — ABNORMAL LOW (ref 3.5–5.0)
Alkaline Phosphatase: 127 U/L (ref 40–150)
BUN: 7 mg/dL (ref 7.0–26.0)
CO2: 26 mEq/L (ref 22–29)
Glucose: 95 mg/dl (ref 70–99)
Potassium: 4 mEq/L (ref 3.5–5.1)

## 2012-02-28 MED ORDER — PROCHLORPERAZINE EDISYLATE 5 MG/ML IJ SOLN
10.0000 mg | Freq: Once | INTRAMUSCULAR | Status: AC
  Administered 2012-02-28: 10 mg via INTRAVENOUS

## 2012-02-28 MED ORDER — SODIUM CHLORIDE 0.9 % IV SOLN
2400.0000 mg/m2 | INTRAVENOUS | Status: DC
  Administered 2012-02-28: 4300 mg via INTRAVENOUS
  Filled 2012-02-28: qty 86

## 2012-02-28 MED ORDER — DEXTROSE 5 % IV SOLN
180.0000 mg/m2 | Freq: Once | INTRAVENOUS | Status: AC
  Administered 2012-02-28: 324 mg via INTRAVENOUS
  Filled 2012-02-28: qty 16.2

## 2012-02-28 MED ORDER — LORAZEPAM 1 MG PO TABS
0.5000 mg | ORAL_TABLET | ORAL | Status: AC
  Administered 2012-02-28: 0.5 mg via ORAL

## 2012-02-28 MED ORDER — ATROPINE SULFATE 1 MG/ML IJ SOLN
0.5000 mg | Freq: Once | INTRAMUSCULAR | Status: AC | PRN
  Administered 2012-02-28: 0.5 mg via INTRAVENOUS

## 2012-02-28 MED ORDER — DEXAMETHASONE SODIUM PHOSPHATE 4 MG/ML IJ SOLN
20.0000 mg | Freq: Once | INTRAMUSCULAR | Status: AC
  Administered 2012-02-28: 20 mg via INTRAVENOUS

## 2012-02-28 MED ORDER — OXYCODONE-ACETAMINOPHEN 5-325 MG PO TABS
1.0000 | ORAL_TABLET | Freq: Once | ORAL | Status: AC
  Administered 2012-02-28: 1 via ORAL

## 2012-02-28 MED ORDER — ONDANSETRON 16 MG/50ML IVPB (CHCC)
16.0000 mg | Freq: Once | INTRAVENOUS | Status: AC
  Administered 2012-02-28: 16 mg via INTRAVENOUS

## 2012-02-28 MED ORDER — HEPARIN SOD (PORK) LOCK FLUSH 100 UNIT/ML IV SOLN
500.0000 [IU] | Freq: Once | INTRAVENOUS | Status: DC | PRN
  Filled 2012-02-28: qty 5

## 2012-02-28 MED ORDER — SODIUM CHLORIDE 0.9 % IV SOLN: Freq: Once | INTRAVENOUS | Status: DC

## 2012-02-28 MED ORDER — FLUOROURACIL CHEMO INJECTION 2.5 GM/50ML
400.0000 mg/m2 | Freq: Once | INTRAVENOUS | Status: AC
  Administered 2012-02-28: 700 mg via INTRAVENOUS
  Filled 2012-02-28: qty 14

## 2012-02-28 MED ORDER — SODIUM CHLORIDE 0.9 % IJ SOLN
10.0000 mL | INTRAMUSCULAR | Status: DC | PRN
  Filled 2012-02-28: qty 10

## 2012-02-28 MED ORDER — ALTEPLASE 2 MG IJ SOLR
2.0000 mg | Freq: Once | INTRAMUSCULAR | Status: AC | PRN
  Administered 2012-02-28: 2 mg
  Filled 2012-02-28: qty 2

## 2012-02-28 MED ORDER — LEUCOVORIN CALCIUM INJECTION 350 MG
400.0000 mg/m2 | Freq: Once | INTRAMUSCULAR | Status: AC
  Administered 2012-02-28: 720 mg via INTRAVENOUS
  Filled 2012-02-28: qty 36

## 2012-02-28 MED ORDER — OXYCODONE HCL 5 MG PO TABS: 5.0000 mg | ORAL_TABLET | Freq: Four times a day (QID) | ORAL | Status: DC | PRN

## 2012-02-28 NOTE — Progress Notes (Signed)
12:30--Pt c/o nausea, vomiting, abdominal cramping.  Per Dr Arline Asp, 0.5 ativan sublingual given to pt.  Per standing orders, 0.5mg  atropine IV given.  SLJ

## 2012-02-28 NOTE — Progress Notes (Signed)
0815 Port accessed, flushes well but no blood return after multiple flushes and repositioning. Order received from Dr. Arline Asp for cath flo. 4098 Cath flo placed. 1191 Attempted to obtain blood return but no blood return, cath flo returned. 4782 Attempted blood return but not successful, cath flo returned. 1006 Attempted to obtain blood return but not successful, cath flo returned. 1036 Attempted to obtain blood return, good blood return noted, cath flo removed from port.  1230 Pt c/o severe abdominal cramping and nausea, one episode of emesis. Chemotherapy stopped.  Pt given Ativan 0.5 mg po and Atropine 0.5 mg IV. Pt stated that cramping had resolved after Atropine given but still continued with nausea. Pt given Compazine 10 mg IV. Pt states that nausea is resolved and chemotherapy resumed.

## 2012-02-28 NOTE — Telephone Encounter (Signed)
Pt is scheduled for CT biopsy on 03/06/12 at [facility].   Tiffany, radiology scheduler spoke with pt today while pt is in infusion and gave pt all necessary info of biopsy.

## 2012-02-28 NOTE — Patient Instructions (Addendum)
[facility] Discharge Instructions for Patients Receiving Chemotherapy  Today you received the following chemotherapy agents Irinotecan, Leucovorin and 5-FU  To help prevent nausea and vomiting after your treatment, we encourage you to take your nausea medication as prescribed.   If you develop nausea and vomiting that is not controlled by your nausea medication, call the clinic. If it is after clinic hours your family physician or the after hours number for the clinic or go to the Emergency Department.   BELOW ARE SYMPTOMS THAT SHOULD BE REPORTED IMMEDIATELY:  *FEVER GREATER THAN 100.5 F  *CHILLS WITH OR WITHOUT FEVER  NAUSEA AND VOMITING THAT IS NOT CONTROLLED WITH YOUR NAUSEA MEDICATION  *UNUSUAL SHORTNESS OF BREATH  *UNUSUAL BRUISING OR BLEEDING  TENDERNESS IN MOUTH AND THROAT WITH OR WITHOUT PRESENCE OF ULCERS  *URINARY PROBLEMS  *BOWEL PROBLEMS  UNUSUAL RASH Items with * indicate a potential emergency and should be followed up as soon as possible.  One of the nurses will contact you 24 hours after your treatment. Please let the nurse know about any problems that you may have experienced. Feel free to call the clinic you have any questions or concerns. The clinic phone number is [phone].   I have been informed and understand all the instructions given to me. I know to contact the clinic, my physician, or go to the Emergency Department if any problems should occur. I do not have any questions at this time, but understand that I may call the clinic during office hours   should I have any questions or need assistance in obtaining follow up care.    __________________________________________  _____________  __________ Signature of Patient or Authorized Representative            Date                   Time    __________________________________________ Nurse's Signature    

## 2012-03-01 ENCOUNTER — Other Ambulatory Visit: Payer: Self-pay | Admitting: Radiology

## 2012-03-01 ENCOUNTER — Ambulatory Visit (HOSPITAL_BASED_OUTPATIENT_CLINIC_OR_DEPARTMENT_OTHER): Payer: Medicaid Other

## 2012-03-01 VITALS — BP 132/70 | HR 107 | Temp 98.1°F

## 2012-03-01 DIAGNOSIS — C187 Malignant neoplasm of sigmoid colon: Secondary | ICD-10-CM

## 2012-03-01 DIAGNOSIS — C189 Malignant neoplasm of colon, unspecified: Secondary | ICD-10-CM

## 2012-03-01 MED ORDER — HEPARIN SOD (PORK) LOCK FLUSH 100 UNIT/ML IV SOLN
500.0000 [IU] | Freq: Once | INTRAVENOUS | Status: AC | PRN
  Administered 2012-03-01: 500 [IU]
  Filled 2012-03-01: qty 5

## 2012-03-01 MED ORDER — SODIUM CHLORIDE 0.9 % IJ SOLN
10.0000 mL | INTRAMUSCULAR | Status: DC | PRN
  Administered 2012-03-01: 10 mL
  Filled 2012-03-01: qty 10

## 2012-03-01 MED ORDER — PEGFILGRASTIM INJECTION 6 MG/0.6ML
6.0000 mg | Freq: Once | SUBCUTANEOUS | Status: AC
  Administered 2012-03-01: 6 mg via SUBCUTANEOUS
  Filled 2012-03-01: qty 0.6

## 2012-03-01 NOTE — Progress Notes (Signed)
Patient to flush room today for pump dc.  Her nausea and vomiting have abated after use of suppositories.  She is able to get her fluids in and is  able to eat.

## 2012-03-01 NOTE — Patient Instructions (Signed)
Call MD for further problems

## 2012-03-04 ENCOUNTER — Encounter (HOSPITAL_COMMUNITY): Payer: Self-pay | Admitting: Pharmacy Technician

## 2012-03-05 ENCOUNTER — Telehealth: Payer: Self-pay | Admitting: *Deleted

## 2012-03-05 ENCOUNTER — Telehealth: Payer: Self-pay | Admitting: Hematology and Oncology

## 2012-03-05 NOTE — Telephone Encounter (Signed)
pt called [name] in [facility] to r/s her bx from 12/4 to 03-27-2023 as she has had a death in the family.  a note has been sent to dr lo to advise a new date and time   [name]

## 2012-03-05 NOTE — Telephone Encounter (Signed)
Called pt on phone listed unsuccessfully.   Left message on voice mail re: confirmed appts with md on 03/12/12 as scheduled.   Left message that it is very important for pt to keep appts as scheduled.

## 2012-03-06 ENCOUNTER — Emergency Department (HOSPITAL_COMMUNITY): Payer: Medicaid Other

## 2012-03-06 ENCOUNTER — Emergency Department (HOSPITAL_COMMUNITY)
Admission: EM | Admit: 2012-03-06 | Discharge: 2012-03-06 | Disposition: A | Discharge status: Home / Self Care | Payer: Medicaid Other | Attending: Emergency Medicine | Admitting: Emergency Medicine

## 2012-03-06 ENCOUNTER — Ambulatory Visit (HOSPITAL_COMMUNITY): Admission: RE | Admit: 2012-03-06 | Payer: Medicaid Other | Source: Ambulatory Visit

## 2012-03-06 ENCOUNTER — Encounter (HOSPITAL_COMMUNITY): Payer: Self-pay

## 2012-03-06 ENCOUNTER — Inpatient Hospital Stay (HOSPITAL_COMMUNITY): Admission: RE | Admit: 2012-03-06 | Payer: Medicaid Other | Source: Ambulatory Visit

## 2012-03-06 DIAGNOSIS — Z79899 Other long term (current) drug therapy: Secondary | ICD-10-CM | POA: Insufficient documentation

## 2012-03-06 DIAGNOSIS — R109 Unspecified abdominal pain: Secondary | ICD-10-CM

## 2012-03-06 DIAGNOSIS — G40909 Epilepsy, unspecified, not intractable, without status epilepticus: Secondary | ICD-10-CM | POA: Insufficient documentation

## 2012-03-06 DIAGNOSIS — Z85038 Personal history of other malignant neoplasm of large intestine: Secondary | ICD-10-CM | POA: Insufficient documentation

## 2012-03-06 DIAGNOSIS — R1013 Epigastric pain: Secondary | ICD-10-CM | POA: Insufficient documentation

## 2012-03-06 DIAGNOSIS — R112 Nausea with vomiting, unspecified: Secondary | ICD-10-CM

## 2012-03-06 DIAGNOSIS — E0500 Thyrotoxicosis with diffuse goiter without thyrotoxic crisis or storm: Secondary | ICD-10-CM | POA: Insufficient documentation

## 2012-03-06 DIAGNOSIS — Z87440 Personal history of urinary (tract) infections: Secondary | ICD-10-CM | POA: Insufficient documentation

## 2012-03-06 DIAGNOSIS — I10 Essential (primary) hypertension: Secondary | ICD-10-CM | POA: Insufficient documentation

## 2012-03-06 DIAGNOSIS — K219 Gastro-esophageal reflux disease without esophagitis: Secondary | ICD-10-CM | POA: Insufficient documentation

## 2012-03-06 DIAGNOSIS — Z8659 Personal history of other mental and behavioral disorders: Secondary | ICD-10-CM | POA: Insufficient documentation

## 2012-03-06 DIAGNOSIS — J45909 Unspecified asthma, uncomplicated: Secondary | ICD-10-CM | POA: Insufficient documentation

## 2012-03-06 DIAGNOSIS — F17200 Nicotine dependence, unspecified, uncomplicated: Secondary | ICD-10-CM | POA: Insufficient documentation

## 2012-03-06 DIAGNOSIS — Z8719 Personal history of other diseases of the digestive system: Secondary | ICD-10-CM | POA: Insufficient documentation

## 2012-03-06 DIAGNOSIS — R197 Diarrhea, unspecified: Secondary | ICD-10-CM | POA: Insufficient documentation

## 2012-03-06 DIAGNOSIS — G8929 Other chronic pain: Secondary | ICD-10-CM | POA: Insufficient documentation

## 2012-03-06 DIAGNOSIS — R51 Headache: Secondary | ICD-10-CM | POA: Insufficient documentation

## 2012-03-06 LAB — CBC WITH DIFFERENTIAL/PLATELET
Basophils Absolute: 0 10*3/uL (ref 0.0–0.1)
Eosinophils Relative: 1 % (ref 0–5)
HCT: 37.1 % (ref 36.0–46.0)
Lymphocytes Relative: 9 % — ABNORMAL LOW (ref 12–46)
MCHC: 34 g/dL (ref 30.0–36.0)
MCV: 77.5 fL — ABNORMAL LOW (ref 78.0–100.0)
Monocytes Absolute: 1.1 10*3/uL — ABNORMAL HIGH (ref 0.1–1.0)
RDW: 14.2 % (ref 11.5–15.5)

## 2012-03-06 LAB — HEPATIC FUNCTION PANEL
ALT: 22 U/L (ref 0–35)
Bilirubin, Direct: 0.1 mg/dL (ref 0.0–0.3)
Indirect Bilirubin: 0.2 mg/dL — ABNORMAL LOW (ref 0.3–0.9)

## 2012-03-06 LAB — BASIC METABOLIC PANEL
Calcium: 9.5 mg/dL (ref 8.4–10.5)
Chloride: 95 mEq/L — ABNORMAL LOW (ref 96–112)
Creatinine, Ser: 0.66 mg/dL (ref 0.50–1.10)
GFR calc Af Amer: >90 mL/min (ref >90)
GFR calc non Af Amer: >90 mL/min (ref >90)

## 2012-03-06 MED ORDER — HEPARIN SOD (PORK) LOCK FLUSH 100 UNIT/ML IV SOLN
500.0000 [IU] | Freq: Once | INTRAVENOUS | Status: AC
  Administered 2012-03-06: 500 [IU] via INTRAVENOUS

## 2012-03-06 MED ORDER — HYDROMORPHONE HCL PF 1 MG/ML IJ SOLN
1.0000 mg | Freq: Once | INTRAMUSCULAR | Status: AC
  Administered 2012-03-06: 1 mg via INTRAVENOUS
  Filled 2012-03-06: qty 1

## 2012-03-06 MED ORDER — SODIUM CHLORIDE 0.9 % IV BOLUS (SEPSIS)
1000.0000 mL | Freq: Once | INTRAVENOUS | Status: AC
  Administered 2012-03-06: 1000 mL via INTRAVENOUS

## 2012-03-06 MED ORDER — ONDANSETRON HCL 4 MG/2ML IJ SOLN
4.0000 mg | Freq: Once | INTRAMUSCULAR | Status: AC
  Administered 2012-03-06: 4 mg via INTRAVENOUS
  Filled 2012-03-06: qty 2

## 2012-03-06 MED ORDER — LORAZEPAM 2 MG/ML IJ SOLN
1.0000 mg | Freq: Once | INTRAMUSCULAR | Status: AC
  Administered 2012-03-06: 05:00:00 via INTRAVENOUS
  Filled 2012-03-06: qty 1

## 2012-03-06 MED ORDER — HEPARIN SOD (PORK) LOCK FLUSH 100 UNIT/ML IV SOLN
INTRAVENOUS | Status: AC
  Administered 2012-03-06: 500 [IU] via INTRAVENOUS
  Filled 2012-03-06: qty 5

## 2012-03-06 MED ORDER — LORAZEPAM 2 MG/ML IJ SOLN
1.0000 mg | Freq: Once | INTRAMUSCULAR | Status: AC
  Administered 2012-03-06: 04:00:00 via INTRAVENOUS
  Filled 2012-03-06: qty 1

## 2012-03-06 NOTE — ED Provider Notes (Signed)
Pt report received at end of shift.  With history of stage III metastatic colon cancer, status post colon resection presents with worsening body aches and abd pain x 2-3 days. Pt has 7 cycles of FOLFOX6.   Pt initially c/o constipation for a few days but now having several bouts of diarrhea.  She is nauseated and had vomited bilious contents.  Pt has been evaluated by Sabino Dick, NP.  I was intructed to continue pain management.    BP 121/73  Pulse 118  Temp 98 F (36.7 C) (Oral)  Resp 25  Ht 5\' 7"  (1.702 m)  Wt 146 lb (66.225 kg)  BMI 22.87 kg/m2  SpO2 100%  LMP 02/07/2012   On examination pt is thin appearing white female appears uncomfortable. Vital signs as documented. Skin warm and dry and without overt rashes. Neck without JVD. Lungs clear. Heart exam notable for regular rhythm, normal sounds and absence of murmurs, rubs or gallops. Abdomen with multiple surgical scars, mild tenderness to epigastric but moderate tenderness to lower abdomen, with firmness to pelvic region.  No guarding or rebound.  Extremities nonedematous.  8:20 AM i have consulted with Dr. Dalene Carrow, who is aware of pt and recommend pain control, fluid rescusitation and to d/c with outpatient f/u.  She also mentioned that the pt is noncompliant and has missed her treatment and biopsy.  She should have close f/u for further care.    9:06 AM Pain has improved.  Pt still nauseated.  Antiemetic given.  Will check PO status.    10:01 AM Pt able to tolerates PO.  She is scheduled for her biopsy today which i encourage for her to make it to her appointment.  Pt also agrees to f/u with Dr. Dalene Carrow for further care.    BP 100/52  Pulse 93  Temp 98 F (36.7 C) (Oral)  Resp 15  Ht 5\' 7"  (1.702 m)  Wt 146 lb (66.225 kg)  BMI 22.87 kg/m2  SpO2 98%  LMP 02/07/2012   I have reviewed nursing notes and vital signs. I personally reviewed the imaging tests through PACS system  I reviewed available ER/hospitalization records  thought the EMR  Results for orders placed during the hospital encounter of 03/06/12  CBC WITH DIFFERENTIAL      Component Value Range   WBC 14.5 (*) 4.0 - 10.5 K/uL   RBC 4.79  3.87 - 5.11 MIL/uL   Hemoglobin 12.6  12.0 - 15.0 g/dL   HCT 40.9  81.1 - 91.4 %   MCV 77.5 (*) 78.0 - 100.0 fL   MCH 26.3  26.0 - 34.0 pg   MCHC 34.0  30.0 - 36.0 g/dL   RDW 78.2  95.6 - 21.3 %   Platelets 268  150 - 400 K/uL   Neutrophils Relative 83 (*) 43 - 77 %   Neutro Abs 12.0 (*) 1.7 - 7.7 K/uL   Lymphocytes Relative 9 (*) 12 - 46 %   Lymphs Abs 1.3  0.7 - 4.0 K/uL   Monocytes Relative 7  3 - 12 %   Monocytes Absolute 1.1 (*) 0.1 - 1.0 K/uL   Eosinophils Relative 1  0 - 5 %   Eosinophils Absolute 0.1  0.0 - 0.7 K/uL   Basophils Relative 0  0 - 1 %   Basophils Absolute 0.0  0.0 - 0.1 K/uL  HEPATIC FUNCTION PANEL      Component Value Range   Total Protein 7.0  6.0 - 8.3 g/dL  Albumin 3.6  3.5 - 5.2 g/dL   AST 17  0 - 37 U/L   ALT 22  0 - 35 U/L   Alkaline Phosphatase 228 (*) 39 - 117 U/L   Total Bilirubin 0.3  0.3 - 1.2 mg/dL   Bilirubin, Direct 0.1  0.0 - 0.3 mg/dL   Indirect Bilirubin 0.2 (*) 0.3 - 0.9 mg/dL  BASIC METABOLIC PANEL      Component Value Range   Sodium 132 (*) 135 - 145 mEq/L   Potassium 4.0  3.5 - 5.1 mEq/L   Chloride 95 (*) 96 - 112 mEq/L   CO2 23  19 - 32 mEq/L   Glucose, Bld 116 (*) 70 - 99 mg/dL   BUN 11  6 - 23 mg/dL   Creatinine, Ser 2.13  0.50 - 1.10 mg/dL   Calcium 9.5  8.4 - 08.6 mg/dL   GFR calc non Af Amer >90  >90 mL/min   GFR calc Af Amer >90  >90 mL/min   Nm Pet Image Restag (ps) Skull Base To Thigh  02/19/2012  *RADIOLOGY REPORT*  Clinical Data: Subsequent treatment strategy for colon carcinoma. Patient status sigmoid colectomy and reversal.  01/30 lymph nodes positive  NUCLEAR MEDICINE PET SKULL BASE TO THIGH  Fasting Blood Glucose:  111  Technique:  16.2 mCi F-18 FDG was injected intravenously. CT data was obtained and used for attenuation correction  and anatomic localization only.  (This was not acquired as a diagnostic CT examination.) Additional exam technical data entered on technologist worksheet.  Comparison:  CT 01/27/2012 PET CT 08/06 1012  Findings: There is significant misregistration between the CT and PET  data set secondary to patient moving between the two scans.  Neck: No hypermetabolic lymph nodes in the neck.  Chest:  There is a 5 mm right upper lobe pulmonary nodule (image 60) which is potentially increased from 3 mm on comparison PET CT scan.  5 mm nodule in the right upper lobe (image 76) similar to prior.  Peripheral nodule in the left lower lobe (image 93) measuring 4 mm is similar to prior.  There is a focus of metabolic active which projects over the right lower hemithorax is felt to represent misregistration with the lesion  actually within the liver.  Abdomen/Pelvis:  There is hypermetabolic focus within the right hemithorax with SUV max = 3.1 (image 90  of the PET data set). This is felt to represent misregistration and is likely within the dome of the right hepatic lobe.  Hypermetabolic activity associated with the right lower quadrant cecal thickening seen on comparison CT with  SUV max = 10.4  There is significant misregistration within the pelvis.  There is repetition of the PET data which can be seen on the coronal images. The activity is felt relate to the urine in the bladder.  There is however a large mass within the pelvis which is cystic and solid node measuring 17 x 12 cm maximal dimension.  The solid components have some low metabolic activity.  Skeleton:  No focal hypermetabolic activity to suggest skeletal metastasis.  IMPRESSION:  1.  Severe misregistration secondary patient movement.  2.  Focal activity in the right hemithorax is felt to be a hypermetabolic lesion in the dome of the right hepatic lobe concerning for solitary hepatic metastasis. 2.  Focal activity at the enteric colonic anastomoses right lower quadrant  concerning for local recurrence. 3.  Severe misregistration within the pelvis with activity felt to relate to urine  activity.  4.  Large solid and cystic mass within the pelvis with low metabolic activity.  Differential includes mucinous adenocarcinoma metastasis versus a ovarian epithelial neoplasm. 5.  Multiple small pulmonary nodules are relatively stable compared to prior.  Consider dedicated CT the thorax for evaluation.   Original Report Authenticated By: Genevive Bi, M.D.    Dg Abd Acute W/chest  03/06/2012  *RADIOLOGY REPORT*  Clinical Data: Abdominal pain.  Colon cancer.  ACUTE ABDOMEN SERIES (ABDOMEN 2 VIEW & CHEST 1 VIEW)  Comparison: PET scan 02/19/2012.  Two-view chest 08/31/1011.  Findings: A left subclavian Port-A-Cath is stable in position.  The tip is in the right atrium.  Mild right basilar airspace disease is stable, likely reflecting atelectasis.  The lungs are otherwise clear.  Suture material is again noted in the right abdomen and left pelvis.  A large pelvic mass is evident.  Bowel gas pattern above this is unremarkable.  There is no evidence for obstruction or free air.  The axial skeleton is unremarkable.  IMPRESSION:  1.  Minimal right basilar atelectasis. 2.  Soft tissue mass in the pelvis. 3.  No evidence for obstruction.   Original Report Authenticated By: Marin Roberts, M.D.       Fayrene Helper, PA-C 03/06/12 1005  Fayrene Helper, PA-C 03/06/12 1059

## 2012-03-06 NOTE — ED Notes (Signed)
Pt tolerated liquids well for about 30 mins, then began to throw up

## 2012-03-06 NOTE — ED Provider Notes (Signed)
History     CSN: [id number]  Arrival date & time 03/06/12  0150   First MD Initiated Contact with Patient 03/06/12 0234      Chief Complaint  Patient presents with  . Nausea  . Emesis  . Diarrhea    (Consider location/radiation/quality/duration/timing/severity/associated sxs/prior treatment) HPI Comments: Patient with a history of metastatic stage IV cancer with metastases to uterus, cervix, and labor was last treated with chemotherapy on Wednesday for the past 2, days.  She's had persistent nausea, vomiting, and increased pain, and she's been unable to tolerate her pain medication at home  Patient is a 36 y.o. female presenting with vomiting and diarrhea. The history is provided by the patient.  Emesis  This is a chronic problem. The current episode started yesterday. The problem has been gradually worsening. Associated symptoms include abdominal pain and diarrhea. Pertinent negatives include no chills, no fever and no myalgias.  Diarrhea The primary symptoms include abdominal pain, nausea, vomiting and diarrhea. Primary symptoms do not include fever, myalgias or rash.  The illness does not include chills or back pain.    Past Medical History  Diagnosis Date  . Asthma   . Gallstones   . Diverticulosis of colon (without mention of hemorrhage)   . Esophageal reflux   . Urinary tract infection   . Depression   . Anxiety   . Chronic headaches   . 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 Allergy   . Hyperthyroidism     takes Propanolol  . Seizures     last one at age 1  . colon ca dx'd 07/26/10    surg; chemo to begin  . Grave's disease   . Hypertension   . Asthma   . Colon cancer     Past Surgical History  Procedure Date  . Dilation and curettage of uterus   . Johnsten drain   . Orif acetabular fracture   . Metal plate 2130    Right hand  . Colon surgery   . Portacath placement   . Hand surgery     Right hand surgery    Family History  Problem  Relation Age of Onset  . COPD Father   . Diabetes Father   . Colon cancer Father   . Colon polyps Father   . Kidney disease Mother   . Cervical cancer Mother   . Breast cancer Maternal Grandmother   . Lung cancer Brother   . Asthma Sister   . Colon cancer Paternal Grandfather     History  Substance Use Topics  . Smoking status: Current Every Day Smoker -- 1.0 packs/day for 20 years    Types: Cigarettes  . Smokeless tobacco: Never Used  . Alcohol Use: No    OB History    Grav Para Term Preterm Abortions TAB SAB Ect Mult Living   15 5 3 2 10 1 9  1 3       Review of Systems  Constitutional: Negative for fever and chills.  HENT: Negative.   Respiratory: Negative for shortness of breath.   Cardiovascular: Negative for chest pain.  Gastrointestinal: Positive for nausea, vomiting, abdominal pain and diarrhea.  Musculoskeletal: Negative for myalgias and back pain.  Skin: Negative for rash and wound.  Neurological: Negative for dizziness and weakness.    Allergies  Codeine; Penicillins; and Sulfa antibiotics  Home Medications   Current Outpatient Rx  Name  Route  Sig  Dispense  Refill  .  ALBUTEROL SULFATE HFA 108 (90 BASE) MCG/ACT IN AERS   Inhalation   Inhale 2 puffs into the lungs every 4 (four) hours as needed. Wheezing and shortness of breath         . DEXAMETHASONE 4 MG PO TABS      Take 2 tablets (8 mg) daily for 3 days after chemotherapy then stop   30 tablet   0   . DOCUSATE SODIUM 100 MG PO TABS   Oral   Take 100 mg by mouth 3 (three) times daily as needed. constipation         . ESOMEPRAZOLE MAGNESIUM 40 MG PO CPDR   Oral   Take 40 mg by mouth daily before breakfast.         . LIDOCAINE-PRILOCAINE 2.5-2.5 % EX CREA   Topical   Apply topically as needed. To port a cath   30 g   0   . ONDANSETRON HCL 8 MG PO TABS      Take 1 tablet twice a day, begin day after chemotherapy and take for 3 days. Then as needed for nausea   30 tablet   0    . OXYCODONE HCL 5 MG PO TABS   Oral   Take 1 tablet (5 mg total) by mouth every 6 (six) hours as needed. Last dose  02/20/12.   30 tablet   0   . PROCHLORPERAZINE 25 MG RE SUPP   Rectal   Place 25 mg rectally as needed. Only with chemo for nausea         . PROPRANOLOL HCL 40 MG PO TABS   Oral   Take 40 mg by mouth as needed. hypertension         . PROPYLTHIOURACIL 50 MG PO TABS   Oral   Take 100 mg by mouth 3 (three) times daily.            BP 121/73  Pulse 118  Temp 98 F (36.7 C) (Oral)  Resp 25  Ht 5\' 7"  (1.702 m)  Wt 146 lb (66.225 kg)  BMI 22.87 kg/m2  SpO2 100%  LMP 02/07/2012  Physical Exam  Constitutional: She appears well-developed. She appears distressed.  HENT:  Head: Normocephalic.  Eyes: Pupils are equal, round, and reactive to light.  Neck: Normal range of motion.  Cardiovascular: Tachycardia present.   Pulmonary/Chest: Effort normal and breath sounds normal.  Abdominal: Soft. She exhibits no distension. There is tenderness in the epigastric area.  Musculoskeletal: Normal range of motion.  Neurological: She is alert.  Skin: Skin is warm.    ED Course  Procedures (including critical care time)   Labs Reviewed  CBC WITH DIFFERENTIAL  HEPATIC FUNCTION PANEL   No results found.   No diagnosis found.    MDM   Pain is decreasing.  Nausea is, abated.  We'll try by mouth challenge if she passes.  This will consider discharge home follow up with her primary care physician       Arman Filter, NP 03/06/12 0602

## 2012-03-06 NOTE — ED Notes (Signed)
[id number]<AV> Expected date:<BR> Expected time:<BR> Means of arrival:<BR> Comments:<BR> Rm 21, [place] 842, 36 F, NVD

## 2012-03-06 NOTE — ED Notes (Signed)
Pt given water to attempt PO fluid challenge  

## 2012-03-06 NOTE — ED Notes (Signed)
Patient did not vomit water. Patient up to bathroom. Vitals normal.

## 2012-03-06 NOTE — ED Notes (Signed)
Per EMS: Pt c/o n/v/d since 1800. Symptoms were all of a sudden. Hx of Stage 4 cancer with mets to uterus, cervix and liver. Last chemo tx was last Wednesday.

## 2012-03-06 NOTE — ED Notes (Signed)
For IV access pt did not want her port accessed due to previous attempts and reporting not being able to get blood return. IV access attempted by 2 RNs with no access. Pt refused to have blood drawn by lab and agreed to have port accessed. IV RN paged.

## 2012-03-07 ENCOUNTER — Other Ambulatory Visit (HOSPITAL_COMMUNITY): Payer: Self-pay | Admitting: Hematology and Oncology

## 2012-03-07 ENCOUNTER — Other Ambulatory Visit: Payer: Self-pay | Admitting: Radiology

## 2012-03-07 NOTE — ED Provider Notes (Signed)
Medical screening examination/treatment/procedure(s) were performed by non-physician practitioner and as supervising physician I was immediately available for consultation/collaboration.  Sunnie Nielsen, MD 03/07/12 [phone]

## 2012-03-07 NOTE — ED Provider Notes (Signed)
Medical screening examination/treatment/procedure(s) were performed by non-physician practitioner and as supervising physician I was immediately available for consultation/collaboration.  Esme Durkin, MD 03/07/12 0256 

## 2012-03-11 ENCOUNTER — Ambulatory Visit (HOSPITAL_COMMUNITY)
Admission: RE | Admit: 2012-03-11 | Discharge: 2012-03-11 | Disposition: A | Discharge status: Home / Self Care | Payer: Medicaid Other | Source: Ambulatory Visit | Attending: Hematology and Oncology | Admitting: Hematology and Oncology

## 2012-03-11 ENCOUNTER — Encounter (HOSPITAL_COMMUNITY): Payer: Self-pay

## 2012-03-11 DIAGNOSIS — C189 Malignant neoplasm of colon, unspecified: Secondary | ICD-10-CM

## 2012-03-11 LAB — CBC
Hemoglobin: 10.8 g/dL — ABNORMAL LOW (ref 12.0–15.0)
MCHC: 32.7 g/dL (ref 30.0–36.0)
Platelets: 198 10*3/uL (ref 150–400)
RDW: 14.2 % (ref 11.5–15.5)

## 2012-03-11 LAB — PROTIME-INR
INR: 1.03 (ref 0.00–1.49)
Prothrombin Time: 13.4 seconds (ref 11.6–15.2)

## 2012-03-11 LAB — APTT: aPTT: 29 seconds (ref 24–37)

## 2012-03-11 MED ORDER — FENTANYL CITRATE 0.05 MG/ML IJ SOLN
INTRAMUSCULAR | Status: AC | PRN
  Administered 2012-03-11 (×4): 50 ug via INTRAVENOUS
  Administered 2012-03-11: 100 ug via INTRAVENOUS

## 2012-03-11 MED ORDER — FENTANYL CITRATE 0.05 MG/ML IJ SOLN
INTRAMUSCULAR | Status: AC
  Filled 2012-03-11: qty 6

## 2012-03-11 MED ORDER — SODIUM CHLORIDE 0.9 % IV SOLN
INTRAVENOUS | Status: DC
  Administered 2012-03-11: 10:00:00 via INTRAVENOUS

## 2012-03-11 MED ORDER — MIDAZOLAM HCL 2 MG/2ML IJ SOLN
INTRAMUSCULAR | Status: AC | PRN
  Administered 2012-03-11: 2 mg via INTRAVENOUS
  Administered 2012-03-11 (×4): 1 mg via INTRAVENOUS

## 2012-03-11 MED ORDER — MIDAZOLAM HCL 2 MG/2ML IJ SOLN
INTRAMUSCULAR | Status: AC
  Filled 2012-03-11: qty 6

## 2012-03-11 MED ORDER — HEPARIN SOD (PORK) LOCK FLUSH 100 UNIT/ML IV SOLN
500.0000 [IU] | INTRAVENOUS | Status: AC | PRN
  Administered 2012-03-11: 500 [IU]
  Filled 2012-03-11: qty 5

## 2012-03-11 NOTE — Procedures (Signed)
Interventional Radiology Procedure Note  Procedure: CT guided FNA and core biopsy of complex right pelvic/adnexal cystic and solid mass.  Approximately 500 mL bloody cyst fluid aspirated Complications: None.  Recommendations: - Bedrest x 3 hrs - ADAT - Pathology pending  Signed,  Sterling Big, MD Vascular & Interventional Radiologist [facility] Radiology

## 2012-03-11 NOTE — H&P (Signed)
Agree with PA note.  Will proceed with CT guided aspiration and bx of right pelvic/adnexal lesion.  Signed,  Sterling Big, MD Vascular & Interventional Radiologist [facility] Radiology

## 2012-03-11 NOTE — H&P (Signed)
Chief Complaint: "I'm here for a biopsy" Referring Physician:Odogwu HPI: [name] is an 36 y.o. female with hx of colon cancer who has a newly found pelvic and liver lesions on recent PET/CT She is referred for biopsy. Radiologist has reviewed and feels pelvic lesion is more amenable to biopsy. PMHx and meds reviewed.  Past Medical History:  Past Medical History  Diagnosis Date  . Asthma   . Gallstones   . Diverticulosis of colon (without mention of hemorrhage)   . Esophageal reflux   . Urinary tract infection   . Depression   . Anxiety   . Chronic headaches   . 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 Allergy   . Hyperthyroidism     takes Propanolol  . Seizures     last one at age 62  . colon ca dx'd 07/26/10    surg; chemo to begin  . Grave's disease   . Hypertension   . Asthma   . Colon cancer     Past Surgical History:  Past Surgical History  Procedure Date  . Dilation and curettage of uterus   . Johnsten drain   . Orif acetabular fracture   . Metal plate 1610    Right hand  . Colon surgery   . Portacath placement   . Hand surgery     Right hand surgery    Family History:  Family History  Problem Relation Age of Onset  . COPD Father   . Diabetes Father   . Colon cancer Father   . Colon polyps Father   . Kidney disease Mother   . Cervical cancer Mother   . Breast cancer Maternal Grandmother   . Lung cancer Brother   . Asthma Sister   . Colon cancer Paternal Grandfather     Social History:  reports that she has been smoking Cigarettes.  She has a 20 pack-year smoking history. She has never used smokeless tobacco. She reports that she drinks alcohol. She reports that she does not use illicit drugs.  Allergies:  Allergies  Allergen Reactions  . Codeine Hives  . Penicillins Hives  . Sulfa Antibiotics Hives    Medications: albuterol (PROAIR HFA) 108 (90 BASE) MCG/ACT inhaler (Taking) Sig - Route: Inhale 2 puffs into the lungs every 4  (four) hours as needed. Wheezing and shortness of breath - Inhalation Class: Historical Med Number of times this order has been changed since signing: 3 Order [address] dexamethasone (DECADRON) 4 MG tablet (Taking) 30 tablet 0 02/21/2012 Sig: Take 2 tablets (8 mg) daily for 3 days after chemotherapy then stop Number of times this order has been changed since signing: 2 Order [address] Docusate Sodium (STOOL SOFTENER) 100 MG capsule (Taking) Sig - Route: Take 100 mg by mouth 3 (three) times daily as needed. constipation - Oral Class: Historical Med Number of times this order has been changed since signing: 3 Order [address] lidocaine-prilocaine (EMLA) cream (Taking) 30 g 0 02/21/2012 Sig - Route: Apply topically as needed. To port a cath - Topical Number of times this order has been changed since signing: 2 Order [address] ondansetron (ZOFRAN) 8 MG tablet (Taking) 30 tablet 0 02/21/2012 Sig: Take 1 tablet twice a day, begin day after chemotherapy and take for 3 days. Then as needed for nausea Number of times this order has been changed since signing: 2 Order [address] oxyCODONE (OXY IR/ROXICODONE) 5 MG immediate release tablet (Taking) 30 tablet  0 02/28/2012 Sig - Route: Take 1 tablet (5 mg total) by mouth every 6 (six) hours as needed. Last dose 02/20/12. - Oral Class: Print Number of times this order has been changed since signing: 2 Order [address] propranolol (INDERAL) 40 MG tablet (Taking) Sig - Route: Take 40 mg by mouth as needed. hypertension - Oral Class: Historical Med Number of times this order has been changed since signing: 5 Order [address] propylthiouracil (PTU) 50 MG tablet (Taking) Sig - Route: Take 100 mg by mouth 3 (three) times daily   Please HPI for pertinent positives, otherwise complete 10 system ROS negative.  Physical Exam: Blood pressure 122/67, pulse 99, temperature 98.3 F (36.8 C), temperature source Oral, resp. rate 20, height 5\' 7"  (1.702 m), weight 143 lb (64.864  kg), last menstrual period 03/11/2012, SpO2 99.00%. Body mass index is 22.40 kg/(m^2).   General Appearance:  Alert, cooperative, no distress, appears stated age  Head:  Normocephalic, without obvious abnormality, atraumatic  ENT: Unremarkable, exopthalmos  Neck: Supple, symmetrical, trachea midline, no adenopathy, thyroid: not enlarged, symmetric, no tenderness/mass/nodules  Lungs:   Clear to auscultation bilaterally, no w/r/r, respirations unlabored without use of accessory muscles.  Chest Wall:  No tenderness or deformity, (L)upper chest port intact, accessed.  Heart:  Regular rate and rhythm, S1, S2 normal, no murmur, rub or gallop. Carotids 2+ without bruit.  Abdomen:   Soft, non-tender, non distended. Bowel sounds active all four quadrants,  no masses.  Neurologic: Normal affect, no gross deficits.   Results for orders placed during the hospital encounter of 03/11/12 (from the past 48 hour(s))  APTT     Status: Normal   Collection Time   03/11/12 10:05 AM      Component Value Range Comment   aPTT 29  24 - 37 seconds   CBC     Status: Abnormal   Collection Time   03/11/12 10:05 AM      Component Value Range Comment   WBC 7.6  4.0 - 10.5 K/uL    RBC 4.18  3.87 - 5.11 MIL/uL    Hemoglobin 10.8 (*) 12.0 - 15.0 g/dL    HCT 47.8 (*) 29.5 - 46.0 %    MCV 78.9  78.0 - 100.0 fL    MCH 25.8 (*) 26.0 - 34.0 pg    MCHC 32.7  30.0 - 36.0 g/dL    RDW 62.1  30.8 - 65.7 %    Platelets 198  150 - 400 K/uL   PROTIME-INR     Status: Normal   Collection Time   03/11/12 10:05 AM      Component Value Range Comment   Prothrombin Time 13.4  11.6 - 15.2 seconds    INR 1.03  0.00 - 1.49    No results found.  Assessment/Plan Hx of colon cancer with new pelvic/liver lesion by PET For CT guided biopsy of pelvic lesion with sedation. Discussed procedure and risks. Consent signed in chart  Brayton El PA-C 03/11/2012, 10:35 AM

## 2012-03-11 NOTE — Progress Notes (Signed)
Patient ambulated in hallway and to restroom. Patient denies SOB, pain and dizziness. Steady gait. Atel lunch without any problems. Denies nausea and vomiting.

## 2012-03-12 ENCOUNTER — Encounter: Payer: Self-pay | Admitting: Hematology and Oncology

## 2012-03-12 ENCOUNTER — Ambulatory Visit (HOSPITAL_BASED_OUTPATIENT_CLINIC_OR_DEPARTMENT_OTHER): Payer: Medicaid Other | Admitting: Hematology and Oncology

## 2012-03-12 ENCOUNTER — Telehealth: Payer: Self-pay | Admitting: Hematology and Oncology

## 2012-03-12 ENCOUNTER — Other Ambulatory Visit: Payer: Self-pay | Admitting: *Deleted

## 2012-03-12 ENCOUNTER — Other Ambulatory Visit (HOSPITAL_BASED_OUTPATIENT_CLINIC_OR_DEPARTMENT_OTHER): Payer: Medicaid Other | Admitting: Lab

## 2012-03-12 VITALS — BP 126/74 | HR 98 | Temp 97.8°F | Resp 20 | Ht 67.0 in | Wt 149.8 lb

## 2012-03-12 DIAGNOSIS — R197 Diarrhea, unspecified: Secondary | ICD-10-CM

## 2012-03-12 DIAGNOSIS — C7960 Secondary malignant neoplasm of unspecified ovary: Secondary | ICD-10-CM

## 2012-03-12 DIAGNOSIS — C189 Malignant neoplasm of colon, unspecified: Secondary | ICD-10-CM

## 2012-03-12 DIAGNOSIS — C187 Malignant neoplasm of sigmoid colon: Secondary | ICD-10-CM

## 2012-03-12 DIAGNOSIS — C787 Secondary malignant neoplasm of liver and intrahepatic bile duct: Secondary | ICD-10-CM

## 2012-03-12 LAB — CBC WITH DIFFERENTIAL/PLATELET
BASO%: 0.7 % (ref 0.0–2.0)
EOS%: 4 % (ref 0.0–7.0)
HCT: 34.1 % — ABNORMAL LOW (ref 34.8–46.6)
LYMPH%: 24.7 % (ref 14.0–49.7)
MCH: 26.4 pg (ref 25.1–34.0)
MCHC: 32.7 g/dL (ref 31.5–36.0)
MONO#: 0.4 10*3/uL (ref 0.1–0.9)
NEUT%: 65.1 % (ref 38.4–76.8)
Platelets: 216 10*3/uL (ref 145–400)
RBC: 4.23 10*6/uL (ref 3.70–5.45)
WBC: 7.5 10*3/uL (ref 3.9–10.3)
lymph#: 1.8 10*3/uL (ref 0.9–3.3)

## 2012-03-12 LAB — COMPREHENSIVE METABOLIC PANEL (CC13)
ALT: 16 U/L (ref 0–55)
AST: 12 U/L (ref 5–34)
Creatinine: 0.7 mg/dL (ref 0.6–1.1)
Sodium: 140 mEq/L (ref 136–145)
Total Bilirubin: 0.4 mg/dL (ref 0.20–1.20)
Total Protein: 6.6 g/dL (ref 6.4–8.3)

## 2012-03-12 MED ORDER — OXYCODONE HCL 5 MG PO TABS: 5.0000 mg | ORAL_TABLET | Freq: Four times a day (QID) | ORAL | Status: DC | PRN

## 2012-03-12 NOTE — Progress Notes (Signed)
Met with patient and explained role of nurse navigator. Gave name and contact phone number.  SW is assisting patient and is  there to speak with patient.   Will see on 03/13/12 during chemo.

## 2012-03-12 NOTE — Progress Notes (Signed)
CC:   Joanne Dresser, MD Joanne Rea. Jarold Motto, MD, Clementeen Graham, FACP, FAGA Wilmon Arms. Tsuei, M.D.  IDENTIFYING STATEMENT:  The patient is a 36 year old woman with stage IV colon cancer, who presents for followup.  INTERVAL HISTORY:  [name] received her 1st cycle of FOLFIRI a week- and-a-half ago.  She reported nausea following therapy.  After further questioning, it appears that she did not initiate her antiemetics as soon as she should have done.  She also reported diarrhea.  She denied mucositis.  She received a CT-guided biopsy of a pelvic mass yesterday. Surgical pathology shows metastatic adenocarcinoma of colorectal primary with associated tumor necrosis.  She also reports left lower-sided abdominal pain, which is well managed with Oxy IR.  She is moving her bowels.  She has no nausea or vomiting.  MEDICATIONS:  Reviewed and updated.  ALLERGIES: 1. Codeine. 2. Penicillin. 3. Sulfa.  PAST MEDICAL HISTORY/FAMILY HISTORY/SOCIAL HISTORY:  Unchanged.  REVIEW OF SYSTEMS:  A 10-point review of systems was negative.  PHYSICAL EXAM:  General:  The patient is alert and oriented x3.  Vital Signs:  Pulse 98, blood pressure 126/74, temperature 97.8, respirations 20.  Weight 149.8 pounds.  HEENT:  Head is atraumatic, normocephalic. Sclerae are anicteric.  Mouth moist.  Neck:  Supple.  Chest:  Clear. Port with no signs of infection.  Abdomen:  Soft.  Nontender.  Bowel sounds present.  Extremities:  No calf tenderness.  Pulses present and symmetrical.  LABORATORY DATA:  On 03/12/2012, white cell count 7.5, hemoglobin 11.2, hematocrit 34.1, platelets 216,000.  Results of recent surgical path as in Interval History.  IMPRESSION AND PLAN:  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  She received her 1st cycle with FOLFIRI on 02/28/2012.  She was counseled about the correct use of antiemetics. She  will continue to keep herself well hydrated.  She will take Imodium as needed for diarrhea.  I will dose reduce therapy by 10%.  We will initiate Avastin with her 3rd cycle.  The patient has indicated that she will not be in town around the Christmas period, so has requested for her chemotherapy to be deferred to a later date. Cycle 3 is planned for 04/04/2012.  She follows up then.  N.B.  She was reminded to remain compliant with follow up specifically  with treatments.   ______________________________ Laurice Record, M.D. LIO/MEDQ  D:  03/12/2012  T:  03/12/2012  Job:  [id number]

## 2012-03-12 NOTE — Patient Instructions (Signed)
Herbert Seta Lynann Beaver  [id number]   [facility] - AFTER VISIT SUMMARY   **RECOMMENDATIONS MADE BY THE CONSULTANT AND ANY TEST    RESULTS WILL BE SENT TO YOUR REFERRING DOCTORS.   YOUR EXAM FINDINGS, LABS AND RESULTS WERE DISCUSSED BY YOUR MD TODAY.  YOU CAN GO TO THE [facility] WEB SITE FOR INSTRUCTIONS ON HOW TO ASSESS MY CHART FOR ADDITIONAL INFORMATION AS NEEDED.  Your Updated drug allergies are: Allergies as of 03/12/2012 - Review Complete 03/12/2012  Allergen Reaction Noted  . Codeine Hives 05/06/2010  . Penicillins Hives 05/06/2010  . Sulfa antibiotics Hives 05/06/2010    Your current list of medications are: Current Outpatient Prescriptions  Medication Sig Dispense Refill  . albuterol (PROAIR HFA) 108 (90 BASE) MCG/ACT inhaler Inhale 2 puffs into the lungs every 4 (four) hours as needed. Wheezing and shortness of breath      . dexamethasone (DECADRON) 4 MG tablet Take 2 tablets (8 mg) daily for 3 days after chemotherapy then stop  30 tablet  0  . Docusate Sodium (STOOL SOFTENER) 100 MG capsule Take 100 mg by mouth 3 (three) times daily as needed. constipation      . esomeprazole (NEXIUM) 40 MG capsule Take 40 mg by mouth daily before breakfast.      . lidocaine-prilocaine (EMLA) cream Apply topically as needed. To port a cath  30 g  0  . ondansetron (ZOFRAN) 8 MG tablet Take 1 tablet twice a day, begin day after chemotherapy and take for 3 days. Then as needed for nausea  30 tablet  0  . oxyCODONE (OXY IR/ROXICODONE) 5 MG immediate release tablet Take 1 tablet (5 mg total) by mouth every 6 (six) hours as needed. Last dose  02/20/12.  30 tablet  0  . prochlorperazine (COMPAZINE) 25 MG suppository Place 25 mg rectally as needed. Only with chemo for nausea      . propranolol (INDERAL) 40 MG tablet Take 40 mg by mouth as needed. hypertension      . propylthiouracil (PTU) 50 MG tablet Take 100 mg by mouth 3 (three) times daily.        Current Facility-Administered  Medications  Medication Dose Route Frequency Provider Last Rate Last Dose  . 0.9 %  sodium chloride infusion  500 mL Intravenous Continuous Mardella Layman, MD       Facility-Administered Medications Ordered in Other Visits  Medication Dose Route Frequency Provider Last Rate Last Dose  . [COMPLETED] fentaNYL (SUBLIMAZE) injection   Intravenous PRN Malachy Moan, MD   50 mcg at 03/11/12 1148  . [COMPLETED] heparin lock flush 100 unit/mL  500 Units Intracatheter Prior to discharge Malachy Moan, MD   500 Units at 03/11/12 1444  . [COMPLETED] midazolam (VERSED) injection   Intravenous PRN Malachy Moan, MD   1 mg at 03/11/12 1147  . [DISCONTINUED] 0.9 %  sodium chloride infusion   Intravenous Continuous Brayton El, PA 20 mL/hr at 03/11/12 1015    . [DISCONTINUED] fentaNYL (SUBLIMAZE) 0.05 MG/ML injection           . [DISCONTINUED] midazolam (VERSED) 2 MG/2ML injection              INSTRUCTIONS GIVEN AND DISCUSSED:  See attached schedule   SPECIAL INSTRUCTIONS/FOLLOW-UP:  See above.  I acknowledge that I have been informed and understand all the instructions given to me and received a copy.I know to contact the clinic, my physician, or go to the emergency Department if any  problems should occur.   I do not have any more questions at this time, but understand that I may call the [facility] at [phone] during business hours should I have any further questions or need assistance in obtaining follow-up care.

## 2012-03-12 NOTE — Telephone Encounter (Signed)
gv and printed appt schedule for pt for Jan 2014..the patient aware

## 2012-03-12 NOTE — Progress Notes (Signed)
This office note has been dictated.

## 2012-03-13 ENCOUNTER — Inpatient Hospital Stay: Payer: Medicaid Other

## 2012-03-13 ENCOUNTER — Telehealth: Payer: Self-pay | Admitting: *Deleted

## 2012-03-13 ENCOUNTER — Other Ambulatory Visit: Payer: Medicaid Other | Admitting: Lab

## 2012-03-13 NOTE — Telephone Encounter (Signed)
Received call from Tiffany, RN in infusion room re:  Pt  Did not come for chemo as scheduled today.   MD made aware.   Pt saw md on 03/12/12;  Pt was aware of chemo appt scheduled today 03/13/12.

## 2012-03-29 ENCOUNTER — Telehealth: Payer: Self-pay | Admitting: Oncology

## 2012-03-29 ENCOUNTER — Telehealth: Payer: Self-pay | Admitting: *Deleted

## 2012-03-29 ENCOUNTER — Other Ambulatory Visit: Payer: Self-pay | Admitting: Family

## 2012-03-29 DIAGNOSIS — C189 Malignant neoplasm of colon, unspecified: Secondary | ICD-10-CM

## 2012-03-29 MED ORDER — OXYCODONE HCL 5 MG PO TABS: 5.0000 mg | ORAL_TABLET | Freq: Four times a day (QID) | ORAL | Status: DC | PRN

## 2012-03-29 NOTE — Telephone Encounter (Signed)
Pt called requesting refill of Oxycodone 5 mg .   Pt also had questions about her next appt with chemo.   Ann, scheduler notified.  Per Dewayne Hatch, she will contact pt with next appt with Dr. Keene Breath, NP prior to chemo. Annice Pih, NP notified of pain meds refill.   Per Annice Pih,  Pt will be given enough Oxycodone refill until next f/u appt. Pt's  Phone    [phone].

## 2012-03-29 NOTE — Telephone Encounter (Signed)
Pt came in today to get established with new provider. Due to no visit appt available at this time to get pt in before next tx pt's appts were r/s for 1/7 (lb/KC/tx and 1/9 pump d/c). Pt was given new appt schedule for January. Due to lack of visit availability ok to r/s appts to week of 1/7 per Thu.

## 2012-03-29 NOTE — Telephone Encounter (Signed)
Was notified by Efraim Kaufmann, scheduler that pt can be seen by Belenda Cruise, NP for Dr. Clelia Croft either on 04/09/12  Or  04/11/12.   Therefore, chemo appt will need to be decided by a provider.  Dr. Arline Asp notified of pt's situation.   Per Dr. Arline Asp,  Pt's appts need to be changed to 04/09/12 to see Belenda Cruise, NP and chemo appt to follow after visit.  Message relayed to [facility], scheduler.

## 2012-04-01 ENCOUNTER — Other Ambulatory Visit: Payer: Self-pay | Admitting: Oncology

## 2012-04-04 ENCOUNTER — Inpatient Hospital Stay: Payer: Medicaid Other

## 2012-04-04 ENCOUNTER — Other Ambulatory Visit: Payer: Medicaid Other | Admitting: Lab

## 2012-04-04 ENCOUNTER — Ambulatory Visit: Payer: Medicaid Other | Admitting: Hematology and Oncology

## 2012-04-04 ENCOUNTER — Ambulatory Visit: Payer: Medicaid Other

## 2012-04-05 ENCOUNTER — Other Ambulatory Visit: Payer: Self-pay

## 2012-04-05 ENCOUNTER — Telehealth: Payer: Self-pay

## 2012-04-05 DIAGNOSIS — C189 Malignant neoplasm of colon, unspecified: Secondary | ICD-10-CM

## 2012-04-05 MED ORDER — OXYCODONE HCL 5 MG PO TABS: 5.0000 mg | ORAL_TABLET | Freq: Four times a day (QID) | ORAL | Status: DC | PRN

## 2012-04-05 NOTE — Telephone Encounter (Signed)
Message copied by Kallie Locks on Fri Apr 05, 2012  9:53 AM ------      Message from: Benjiman Core      Created: Fri Apr 05, 2012  9:30 AM      Regarding: RE: "Refill"      Contact: [phone]       Ok to refill.             FS      ----- Message -----         From: Marcell Barlow, RN         Sent: 04/05/2012   9:08 AM           To: Benjiman Core, MD      Subject: "Refill"                                                 Patient requests refill on Oxy-IR 5mg ; former patient of Odogwu;p patient has had compliance issues; Rx last filled by Norina Buzzard, NP on 03/29/12; patient scheduled to see Belenda Cruise on 04/09/12, who wants further opinion from you about giving refill; explained to patient that it is very important for her to keep this appointment to be evaluated by new physician; patient is on active chemotherapy (FOLFURI); patient promises to keep appointments on 04/09/12.

## 2012-04-05 NOTE — Progress Notes (Signed)
Rx for Oxy-IR 5mg  @ injection nurse's desk; patient is aware.

## 2012-04-06 ENCOUNTER — Ambulatory Visit: Payer: Medicaid Other

## 2012-04-09 ENCOUNTER — Encounter: Payer: Medicaid Other | Admitting: Oncology

## 2012-04-09 ENCOUNTER — Telehealth: Payer: Self-pay | Admitting: *Deleted

## 2012-04-09 ENCOUNTER — Other Ambulatory Visit: Payer: Medicaid Other | Admitting: Lab

## 2012-04-09 ENCOUNTER — Inpatient Hospital Stay: Payer: Medicaid Other

## 2012-04-09 NOTE — Progress Notes (Signed)
No show for lab, visit, chemo, today. POF to reschedule patient within 1-2 weeks. This encounter was created in error - please disregard.

## 2012-04-09 NOTE — Telephone Encounter (Signed)
Per patient voicemail, she is having car issues. I have forwarded the message to the desk RN.  I have called the patient to let her know we received her message and will call her back.  JMW

## 2012-04-10 ENCOUNTER — Telehealth: Payer: Self-pay | Admitting: Oncology

## 2012-04-10 NOTE — Telephone Encounter (Signed)
lvm and mailed appt schedule to pt.Joanne KitchenMarland Koch

## 2012-04-11 ENCOUNTER — Other Ambulatory Visit: Payer: Medicaid Other | Admitting: Lab

## 2012-04-11 ENCOUNTER — Telehealth: Payer: Self-pay | Admitting: *Deleted

## 2012-04-11 ENCOUNTER — Telehealth: Payer: Self-pay | Admitting: Oncology

## 2012-04-11 ENCOUNTER — Encounter: Payer: Medicaid Other | Admitting: Oncology

## 2012-04-11 NOTE — Telephone Encounter (Signed)
lvm for pt regarding her 1.14.14 appt

## 2012-04-11 NOTE — Telephone Encounter (Signed)
Pe staff message I have scheudled appt. JMW

## 2012-04-12 NOTE — Progress Notes (Signed)
This encounter was created in error - please disregard.

## 2012-04-16 ENCOUNTER — Telehealth: Payer: Self-pay | Admitting: *Deleted

## 2012-04-16 ENCOUNTER — Other Ambulatory Visit (HOSPITAL_BASED_OUTPATIENT_CLINIC_OR_DEPARTMENT_OTHER): Payer: Medicaid Other | Admitting: Lab

## 2012-04-16 ENCOUNTER — Ambulatory Visit (HOSPITAL_BASED_OUTPATIENT_CLINIC_OR_DEPARTMENT_OTHER): Payer: Medicaid Other

## 2012-04-16 ENCOUNTER — Telehealth: Payer: Self-pay | Admitting: Oncology

## 2012-04-16 ENCOUNTER — Encounter: Payer: Self-pay | Admitting: Oncology

## 2012-04-16 ENCOUNTER — Ambulatory Visit (HOSPITAL_BASED_OUTPATIENT_CLINIC_OR_DEPARTMENT_OTHER): Payer: Medicaid Other | Admitting: Oncology

## 2012-04-16 VITALS — BP 139/78 | HR 118 | Temp 98.0°F | Resp 20 | Ht 67.0 in | Wt 152.2 lb

## 2012-04-16 DIAGNOSIS — C187 Malignant neoplasm of sigmoid colon: Secondary | ICD-10-CM

## 2012-04-16 DIAGNOSIS — C7960 Secondary malignant neoplasm of unspecified ovary: Secondary | ICD-10-CM

## 2012-04-16 DIAGNOSIS — C787 Secondary malignant neoplasm of liver and intrahepatic bile duct: Secondary | ICD-10-CM

## 2012-04-16 DIAGNOSIS — C189 Malignant neoplasm of colon, unspecified: Secondary | ICD-10-CM

## 2012-04-16 DIAGNOSIS — G893 Neoplasm related pain (acute) (chronic): Secondary | ICD-10-CM

## 2012-04-16 DIAGNOSIS — Z5112 Encounter for antineoplastic immunotherapy: Secondary | ICD-10-CM

## 2012-04-16 LAB — CBC WITH DIFFERENTIAL/PLATELET
BASO%: 0.4 % (ref 0.0–2.0)
EOS%: 6.2 % (ref 0.0–7.0)
HCT: 39.8 % (ref 34.8–46.6)
LYMPH%: 24 % (ref 14.0–49.7)
MCH: 26.8 pg (ref 25.1–34.0)
MCHC: 32.4 g/dL (ref 31.5–36.0)
MCV: 82.8 fL (ref 79.5–101.0)
MONO#: 0.3 10*3/uL (ref 0.1–0.9)
MONO%: 4.2 % (ref 0.0–14.0)
NEUT%: 65.2 % (ref 38.4–76.8)
Platelets: 217 10*3/uL (ref 145–400)
RBC: 4.81 10*6/uL (ref 3.70–5.45)

## 2012-04-16 LAB — COMPREHENSIVE METABOLIC PANEL (CC13)
ALT: <6 U/L (ref 0–55)
Alkaline Phosphatase: 105 U/L (ref 40–150)
CO2: 25 mEq/L (ref 22–29)
Creatinine: 0.7 mg/dL (ref 0.6–1.1)
Total Bilirubin: 0.61 mg/dL (ref 0.20–1.20)

## 2012-04-16 MED ORDER — ONDANSETRON HCL 8 MG PO TABS: ORAL_TABLET | ORAL | Status: DC

## 2012-04-16 MED ORDER — SODIUM CHLORIDE 0.9 % IV SOLN
5.0000 mg/kg | Freq: Once | INTRAVENOUS | Status: AC
  Administered 2012-04-16: 350 mg via INTRAVENOUS
  Filled 2012-04-16: qty 14

## 2012-04-16 MED ORDER — ONDANSETRON 16 MG/50ML IVPB (CHCC)
16.0000 mg | Freq: Once | INTRAVENOUS | Status: AC
  Administered 2012-04-16: 16 mg via INTRAVENOUS

## 2012-04-16 MED ORDER — DEXTROSE 5 % IV SOLN
300.0000 mg/m2 | Freq: Once | INTRAVENOUS | Status: AC
  Administered 2012-04-16: 540 mg via INTRAVENOUS
  Filled 2012-04-16: qty 27

## 2012-04-16 MED ORDER — FLUOROURACIL CHEMO INJECTION 2.5 GM/50ML
300.0000 mg/m2 | Freq: Once | INTRAVENOUS | Status: AC
  Administered 2012-04-16: 550 mg via INTRAVENOUS
  Filled 2012-04-16: qty 11

## 2012-04-16 MED ORDER — ATROPINE SULFATE 1 MG/ML IJ SOLN
0.5000 mg | Freq: Once | INTRAMUSCULAR | Status: AC | PRN
  Administered 2012-04-16: 0.5 mg via INTRAVENOUS

## 2012-04-16 MED ORDER — OXYCODONE HCL 5 MG PO TABS: 5.0000 mg | ORAL_TABLET | Freq: Four times a day (QID) | ORAL | Status: DC | PRN

## 2012-04-16 MED ORDER — DEXAMETHASONE SODIUM PHOSPHATE 4 MG/ML IJ SOLN
20.0000 mg | Freq: Once | INTRAMUSCULAR | Status: AC
  Administered 2012-04-16: 20 mg via INTRAVENOUS

## 2012-04-16 MED ORDER — SODIUM CHLORIDE 0.9 % IV SOLN
1800.0000 mg/m2 | INTRAVENOUS | Status: DC
  Administered 2012-04-16: 3250 mg via INTRAVENOUS
  Filled 2012-04-16: qty 65

## 2012-04-16 MED ORDER — SODIUM CHLORIDE 0.9 % IV SOLN
Freq: Once | INTRAVENOUS | Status: AC
  Administered 2012-04-16: 13:00:00 via INTRAVENOUS

## 2012-04-16 MED ORDER — IRINOTECAN HCL CHEMO INJECTION 100 MG/5ML
133.0000 mg/m2 | Freq: Once | INTRAVENOUS | Status: AC
  Administered 2012-04-16: 240 mg via INTRAVENOUS
  Filled 2012-04-16: qty 12

## 2012-04-16 NOTE — Telephone Encounter (Signed)
gv and pritned appt schedule for pt for Jan and Feb....emailed michelle to add tx

## 2012-04-16 NOTE — Progress Notes (Signed)
Hematology and Oncology Follow Up Visit  [name] [id number] [birth year] 37 y.o. 04/16/2012 1:22 PM CAMPBELL,STEPHEN, Virgel Paling, MD   Principle Diagnosis: Stage IV KRAS mutated adenocarcinoma of the colon with metastasis to the liver, both ovaries, and possibly lung.  Prior Therapy:  1. S/P laparoscopic assisted sigmoid colectomy on 08/05/10 for a 5 cm well-differentiated adenocarcinoma with 1/20 positive lymph nodes. 2. S/P temporary ileostomy which was reversed on 10/04/10. 3. Received FOLFOX 6 on 11/07/10 and received 2 cycles. She took a month off for personal issues and resumes treatment with a 15% dose reduction due to thrombocytopenia. Completed a total of 7 cycles on 03/07/11. She was thereafter lost to follow-up  Current therapy: FOLFIRI initiated on 02/28/12. She is here for cycle 2. The patient delayed chemotherapy until after the holidays. She is also due to begin Avastin today.  Interim History:  [name] is seen for routine follow-up today prior to chemotherapy. She has missed several appointment with [place] due to transportation issues. She continues to have right sided abdominal pain around to her flank. Uses Oxycodone 1-2 times per day, but more often when she has her period as her pain is exacerbated at that time. Had nausea and vomiting a few times during the night. Using Zofran and Compazine with relief. Stools alternate between diarrhea and constipation. Denies blood in her stool or urine. Denies chest pain, shortness of breath, dyspnea.   Medications: I have reviewed the patient's current medications. Current outpatient prescriptions:albuterol (PROAIR HFA) 108 (90 BASE) MCG/ACT inhaler, Inhale 2 puffs into the lungs every 4 (four) hours as needed. Wheezing and shortness of breath, Disp: , Rfl: ;  dexamethasone (DECADRON) 4 MG tablet, Take 2 tablets (8 mg) daily for 3 days after chemotherapy then stop, Disp: 30 tablet, Rfl: 0 Docusate Sodium (STOOL SOFTENER) 100 MG capsule,  Take 100 mg by mouth 3 (three) times daily as needed. constipation, Disp: , Rfl: ;  esomeprazole (NEXIUM) 40 MG capsule, Take 40 mg by mouth daily before breakfast., Disp: , Rfl: ;  lidocaine-prilocaine (EMLA) cream, Apply topically as needed. To port a cath, Disp: 30 g, Rfl: 0 ondansetron (ZOFRAN) 8 MG tablet, Take 1 tablet twice a day, begin day after chemotherapy and take for 3 days. Then as needed for nausea, Disp: 30 tablet, Rfl: 2;  oxyCODONE (OXY IR/ROXICODONE) 5 MG immediate release tablet, Take 1-2 tablets (5-10 mg total) by mouth every 6 (six) hours as needed. Take 1 - 2 tabs po every  4 - 6 hrs  PRN Pain., Disp: 50 tablet, Rfl: 0 prochlorperazine (COMPAZINE) 25 MG suppository, Place 25 mg rectally as needed. Only with chemo for nausea, Disp: , Rfl: ;  propranolol (INDERAL) 40 MG tablet, Take 40 mg by mouth as needed. hypertension, Disp: , Rfl: ;  propylthiouracil (PTU) 50 MG tablet, Take 100 mg by mouth 3 (three) times daily. , Disp: , Rfl:  Current facility-administered medications:0.9 %  sodium chloride infusion, 500 mL, Intravenous, Continuous, Mardella Layman, MD Facility-Administered Medications Ordered in Other Visits: atropine injection 0.5 mg, 0.5 mg, Intravenous, Once PRN, Lauretta I Odogwu, MD;  bevacizumab (AVASTIN) 350 mg in sodium chloride 0.9 % 100 mL chemo infusion, 5 mg/kg (Treatment Plan Actual), Intravenous, Once, Myrtis Ser, NP fluorouracil (ADRUCIL) 3,250 mg in sodium chloride 0.9 % 150 mL chemo infusion, 1,800 mg/m2 (Treatment Plan Actual), Intravenous, 1 day or 1 dose, Benjiman Core, MD;  fluorouracil (ADRUCIL) chemo injection 550 mg, 300 mg/m2 (Treatment Plan Actual), Intravenous, Once,  Benjiman Core, MD;  irinotecan (CAMPTOSAR) 240 mg in dextrose 5 % 500 mL chemo infusion, 133 mg/m2 (Treatment Plan Actual), Intravenous, Once, Benjiman Core, MD leucovorin 540 mg in dextrose 5 % 250 mL infusion, 300 mg/m2 (Treatment Plan Actual), Intravenous, Once, Benjiman Core,  MD;  ondansetron (ZOFRAN) IVPB 16 mg, 16 mg, Intravenous, Once, Vicente Serene I Odogwu, MD, 16 mg at 04/16/12 1319  Allergies:  Allergies  Allergen Reactions  . Codeine Hives  . Penicillins Hives  . Sulfa Antibiotics Hives    Past Medical History, Surgical history, Social history, and Family History were reviewed and updated.  Review of Systems: Constitutional:  Negative for fever, chills, night sweats, anorexia, weight loss, pain. Cardiovascular: no chest pain or dyspnea on exertion Respiratory: no cough, shortness of breath, or wheezing Neurological: no TIA or stroke symptoms Dermatological: negative ENT: negative Skin: Negative. Gastrointestinal: positive for - abdominal pain negative for - appetite loss or melena Genito-Urinary: no dysuria, trouble voiding, or hematuria Hematological and Lymphatic: negative Breast: negative for breast lumps Musculoskeletal: negative Remaining ROS negative.  Physical Exam: Blood pressure 139/78, pulse 118, temperature 98 F (36.7 C), temperature source Oral, resp. rate 20, height 5\' 7"  (1.702 m), weight 152 lb 3.2 oz (69.037 kg). ECOG: 1 General appearance: alert, cooperative and no distress Head: Normocephalic, without obvious abnormality, atraumatic Neck: no adenopathy, no carotid bruit, no JVD, supple, symmetrical, trachea midline and thyroid not enlarged, symmetric, no tenderness/mass/nodules Lymph nodes: Cervical, supraclavicular, and axillary nodes normal. Heart:regular rate and rhythm, S1, S2 normal, no murmur, click, rub or gallop Lung:chest clear, no wheezing, rales, normal symmetric air entry, no tachypnea, retractions or cyanosis Abdomen: soft, normal bowel sounds and mass located in the lower abdomen EXT:no erythema, induration, or nodules   Lab Results: Lab Results  Component Value Date   WBC 7.9 04/16/2012   HGB 12.9 04/16/2012   HCT 39.8 04/16/2012   MCV 82.8 04/16/2012   PLT 217 04/16/2012     Chemistry      Component  Value Date/Time   NA 140 04/16/2012 1134   NA 132* 03/06/2012 0345   K 3.5 04/16/2012 1134   K 4.0 03/06/2012 0345   CL 106 04/16/2012 1134   CL 95* 03/06/2012 0345   CO2 25 04/16/2012 1134   CO2 23 03/06/2012 0345   BUN 8.0 04/16/2012 1134   BUN 11 03/06/2012 0345   CREATININE 0.7 04/16/2012 1134   CREATININE 0.66 03/06/2012 0345      Component Value Date/Time   CALCIUM 9.3 04/16/2012 1134   CALCIUM 9.5 03/06/2012 0345   ALKPHOS 105 04/16/2012 1134   ALKPHOS 228* 03/06/2012 0345   AST 10 04/16/2012 1134   AST 17 03/06/2012 0345   ALT <6 Repeated and Verified 04/16/2012 1134   ALT 22 03/06/2012 0345   BILITOT 0.61 04/16/2012 1134   BILITOT 0.3 03/06/2012 0345     Impression and Plan:  This is a 37 year old female with the following issues:  1. Stage IV colon cancer. Here for cycle 2 of FOLFIRI today. Will reduce chemotherapy doses by 25% of baseline due to nausea, vomiting, diarrhea. Will add Avastin today. Side effects discussed with the patient including HTN and risk of bowel perforation. She is willing to proceed. Plan to get restaging CT scans after the 4 th cycle of chemo to assess response.  2. Abdominal pain. Due to colon ca. I have refilled her Oxycodone today.  3. Nausea/vomiting prophylaxis. She has Zofran and Compazine at  home.  4. Follow-up. In 2 & 4 weeks with labs and chemo.  Case reviewed with Dr Clelia Croft. Spent more than half the time coordinating care.    Clenton Pare 1/14/20141:22 PM

## 2012-04-16 NOTE — Telephone Encounter (Signed)
Per staff message and POF I have scheduled appts.  JMW  

## 2012-04-16 NOTE — Patient Instructions (Addendum)
RETURN FOR PUMP DISCONNECT 04/18/2012 AT     [facility] Discharge Instructions for Patients Receiving Chemotherapy  Today you received the following chemotherapy agents: avastion, irinotecan, leucovorin, 5FU, 5FU pump.  To help prevent nausea and vomiting after your treatment, we encourage you to take your nausea medication.  Take it as often as prescribed.     If you develop nausea and vomiting that is not controlled by your nausea medication, call the clinic. If it is after clinic hours your family physician or the after hours number for the clinic or go to the Emergency Department.   BELOW ARE SYMPTOMS THAT SHOULD BE REPORTED IMMEDIATELY:  *FEVER GREATER THAN 100.5 F  *CHILLS WITH OR WITHOUT FEVER  NAUSEA AND VOMITING THAT IS NOT CONTROLLED WITH YOUR NAUSEA MEDICATION  *UNUSUAL SHORTNESS OF BREATH  *UNUSUAL BRUISING OR BLEEDING  TENDERNESS IN MOUTH AND THROAT WITH OR WITHOUT PRESENCE OF ULCERS  *URINARY PROBLEMS  *BOWEL PROBLEMS  UNUSUAL RASH Items with * indicate a potential emergency and should be followed up as soon as possible.  Feel free to call the clinic you have any questions or concerns. The clinic phone number is [phone].   I have been informed and understand all the instructions given to me. I know to contact the clinic, my physician, or go to the Emergency Department if any problems should occur. I do not have any questions at this time, but understand that I may call the clinic during office hours   should I have any questions or need assistance in obtaining follow up care.    __________________________________________  _____________  __________ Signature of Patient or Authorized Representative            Date                   Time    __________________________________________ Nurse's Signature

## 2012-04-17 ENCOUNTER — Telehealth: Payer: Self-pay | Admitting: *Deleted

## 2012-04-17 MED ORDER — LORAZEPAM 0.5 MG PO TABS: ORAL_TABLET | ORAL | Status: DC

## 2012-04-17 NOTE — Telephone Encounter (Signed)
Spoke with Clenton Pare, NP, patient may have Ativan PO/SL, she is to take Imodium for diarrhea, continue to force fluids and call in the morning if this does not work. Called patient with instructions, she verbalized understanding.

## 2012-04-17 NOTE — Addendum Note (Signed)
Addended by: Laroy Apple E on: 04/17/2012 04:45 PM   Modules accepted: Orders

## 2012-04-17 NOTE — Telephone Encounter (Signed)
Patient calling in, stating she started having nausea/vomiting early this morning, Zofran is not working. Tried using suppository but also has diarrhea, so this is not working either.  Has been trying soup, crackers, bland foods, etc. Would like to know if something else can be called in. Patient due to return to office tomorrow at 3pm for pump d/c. Will consult with Dr Clelia Croft for further instructions. Temporary phone number for today is [phone].

## 2012-04-18 ENCOUNTER — Ambulatory Visit (HOSPITAL_BASED_OUTPATIENT_CLINIC_OR_DEPARTMENT_OTHER): Payer: Medicaid Other

## 2012-04-18 ENCOUNTER — Other Ambulatory Visit: Payer: Medicaid Other | Admitting: Lab

## 2012-04-18 VITALS — BP 123/72 | HR 91 | Temp 98.4°F

## 2012-04-18 DIAGNOSIS — C187 Malignant neoplasm of sigmoid colon: Secondary | ICD-10-CM

## 2012-04-18 DIAGNOSIS — C7960 Secondary malignant neoplasm of unspecified ovary: Secondary | ICD-10-CM

## 2012-04-18 DIAGNOSIS — C189 Malignant neoplasm of colon, unspecified: Secondary | ICD-10-CM

## 2012-04-18 DIAGNOSIS — C787 Secondary malignant neoplasm of liver and intrahepatic bile duct: Secondary | ICD-10-CM

## 2012-04-18 MED ORDER — PEGFILGRASTIM INJECTION 6 MG/0.6ML
6.0000 mg | Freq: Once | SUBCUTANEOUS | Status: AC
  Administered 2012-04-18: 6 mg via SUBCUTANEOUS
  Filled 2012-04-18: qty 0.6

## 2012-04-18 MED ORDER — SODIUM CHLORIDE 0.9 % IJ SOLN
10.0000 mL | INTRAMUSCULAR | Status: DC | PRN
  Administered 2012-04-18: 10 mL
  Filled 2012-04-18: qty 10

## 2012-04-18 MED ORDER — HEPARIN SOD (PORK) LOCK FLUSH 100 UNIT/ML IV SOLN
500.0000 [IU] | Freq: Once | INTRAVENOUS | Status: AC | PRN
  Administered 2012-04-18: 500 [IU]
  Filled 2012-04-18: qty 5

## 2012-04-18 NOTE — Patient Instructions (Signed)
Call MD for problems and questions

## 2012-04-20 ENCOUNTER — Ambulatory Visit: Payer: Medicaid Other

## 2012-04-23 ENCOUNTER — Ambulatory Visit: Payer: Medicaid Other | Admitting: Oncology

## 2012-04-23 ENCOUNTER — Other Ambulatory Visit: Payer: Medicaid Other | Admitting: Lab

## 2012-04-23 ENCOUNTER — Telehealth: Payer: Self-pay | Admitting: *Deleted

## 2012-04-23 ENCOUNTER — Other Ambulatory Visit: Payer: Self-pay | Admitting: *Deleted

## 2012-04-23 ENCOUNTER — Inpatient Hospital Stay: Payer: Medicaid Other

## 2012-04-23 DIAGNOSIS — C189 Malignant neoplasm of colon, unspecified: Secondary | ICD-10-CM

## 2012-04-23 MED ORDER — OXYCODONE HCL 5 MG PO TABS: 5.0000 mg | ORAL_TABLET | Freq: Four times a day (QID) | ORAL | Status: DC | PRN

## 2012-04-23 NOTE — Telephone Encounter (Signed)
Patient calling to request a refill on her pain medication

## 2012-04-30 ENCOUNTER — Ambulatory Visit: Payer: Medicaid Other

## 2012-04-30 ENCOUNTER — Encounter: Payer: Medicaid Other | Admitting: Oncology

## 2012-04-30 ENCOUNTER — Other Ambulatory Visit: Payer: Medicaid Other | Admitting: Lab

## 2012-04-30 NOTE — Progress Notes (Signed)
No show for labs/visit/chemo today. I will see her at next scheduled visit on 05/14/12. Cancel pump d/c for 05/02/12. This encounter was created in error - please disregard.

## 2012-05-01 ENCOUNTER — Telehealth: Payer: Self-pay | Admitting: *Deleted

## 2012-05-01 DIAGNOSIS — C189 Malignant neoplasm of colon, unspecified: Secondary | ICD-10-CM

## 2012-05-01 MED ORDER — OXYCODONE HCL 5 MG PO TABS: 5.0000 mg | ORAL_TABLET | Freq: Four times a day (QID) | ORAL | Status: DC | PRN

## 2012-05-01 NOTE — Telephone Encounter (Signed)
Patient calling to request refill on her oxycodone, and to say she missed her chemo appt because the schools closed d/t inclemate  Weather. States she called here twice, no one called her back and her call  was transferred to a scheduler. i requested that she ask for a nurse for future calls, re: chemotherapy appts.

## 2012-05-02 ENCOUNTER — Other Ambulatory Visit: Payer: Medicaid Other | Admitting: Lab

## 2012-05-06 ENCOUNTER — Other Ambulatory Visit: Payer: Self-pay | Admitting: Oncology

## 2012-05-06 ENCOUNTER — Telehealth: Payer: Self-pay | Admitting: *Deleted

## 2012-05-06 DIAGNOSIS — C189 Malignant neoplasm of colon, unspecified: Secondary | ICD-10-CM

## 2012-05-06 IMAGING — PT NM PET TUM IMG INITIAL (PI) SKULL BASE T - THIGH
6 series · 25 of 25 positions shown · non-contrast
Comparison: CT scan from 11/01/2010.

CLINICAL DATA: Initial treatment strategy for colon cancer with
liver metastases.

NUCLEAR MEDICINE PET CT INITIAL (PI) SKULL BASE TO THIGH
TECHNIQUE: 16.4 mCi F-18 FDG was injected intravenously via the
existing left power port.  Full-ring PET imaging was performed from
the skull base through the mid-thighs 63  minutes after injection.
CT data was obtained and used for attenuation correction and
anatomic localization only.  (This was not acquired as a diagnostic
CT examination.)
Fasting Blood Glucose:  102

[Series 1: pet ac · axial · 3.3mm · 4.69mm/px · z∈[-870,+0]mm · 5 of 267 slices shown]
[im 1/267]
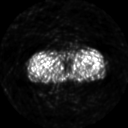
[im 67/267]
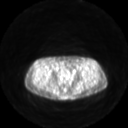
[im 134/267]
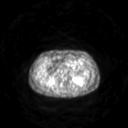
[im 200/267]
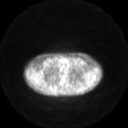
[im 267/267]
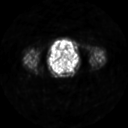

[Series 2: pet nac · axial · 3.3mm · 4.69mm/px · z∈[-870,+0]mm · 6 of 267 slices shown]
[im 1/267]
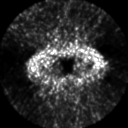
[im 54/267]
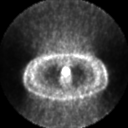
[im 107/267]
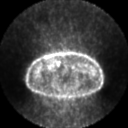
[im 160/267]
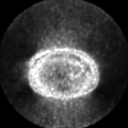
[im 213/267]
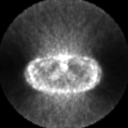
[im 267/267]
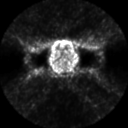

[Series 2: ct images · axial · 3.8mm · 0.98mm/px · z∈[-870,+0]mm · 6 of 267 slices shown]
[im 1/267]
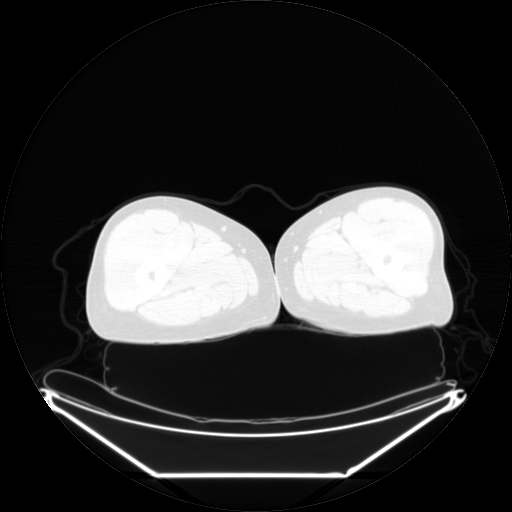
[im 54/267]
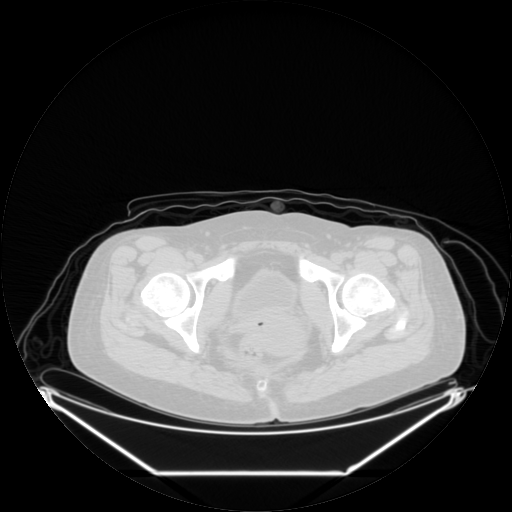
[im 107/267]
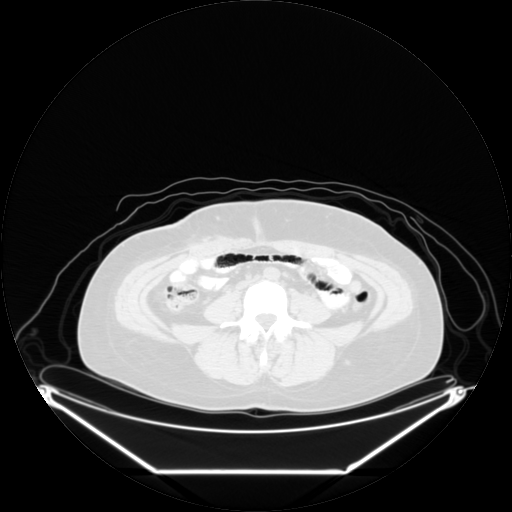
[im 160/267]
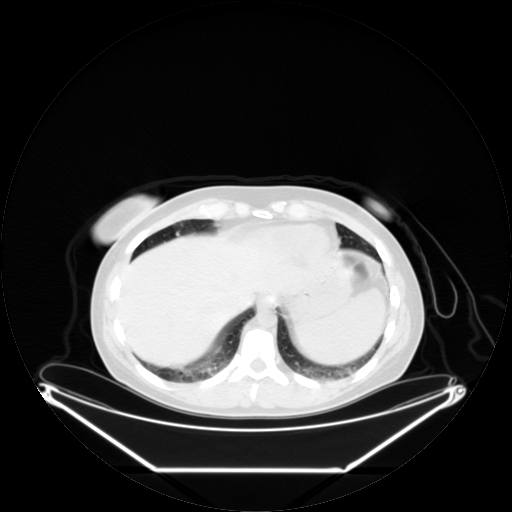
[im 213/267]
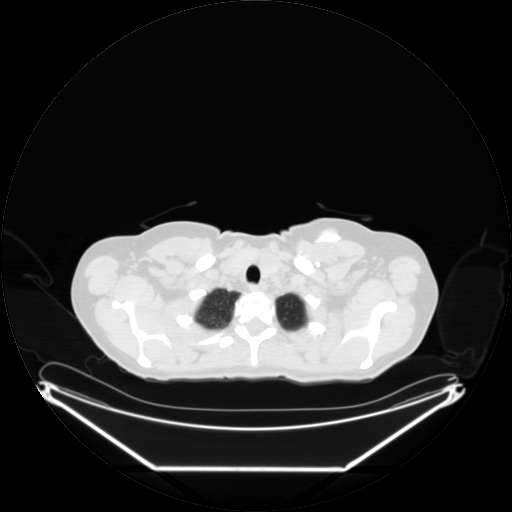
[im 267/267  brain]
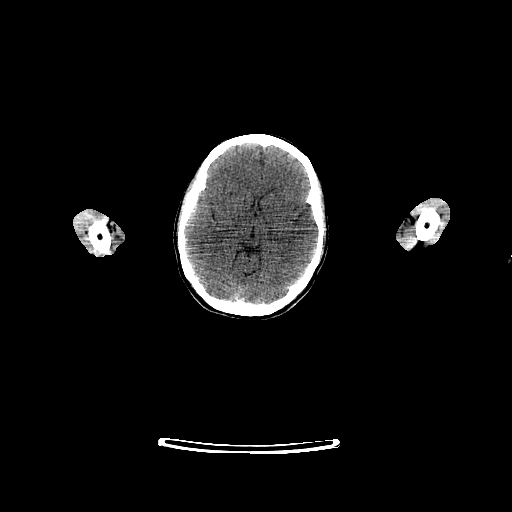

[Series 123: mip · coronal · 3.3mm · 4.69mm/px · 1 of 30 slices shown]
[im 1/30]
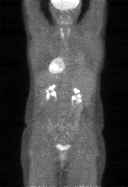

[Series 151: reformatted · axial · 3.3mm · 3.91mm/px · z∈[-870,+0]mm · 6 of 265 slices shown (1 of 2)]
[im 1/265]
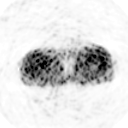
[im 53/265]
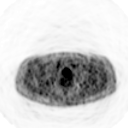
[im 106/265]
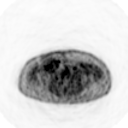
[im 159/265]
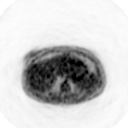
[im 212/265]
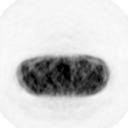
[im 265/265]
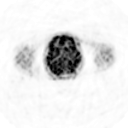

[Series 153: reformatted · coronal · 4.7mm · 6.98mm/px · 1 of 68 slices shown (2 of 2)]
[im 1/68]
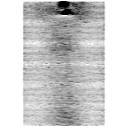

[25 of 25 positions shown; findings below may reference images not displayed]

FINDINGS: No unexpected or suspicious foci of radiotracer
accumulation are identified in the neck, chest, abdomen, or pelvis
to suggest metastatic disease.  Specifically, there is no evidence
for increased F D G accumulation within the liver to correspond to
the ill-defined low density lesions seen on the recent CT scan.

There is some low-level uptake in the anterior right abdominal
wall, associated with the scar at the site of prior ileostomy.  The
uptake is probably related to granulation tissue at this level.

A left-sided Port-A-Cath is identified with the distal tip
positioned in the right atrium.  The low density lesions in
question on the prior CT scan are not evident on the CT data
obtained today without IV contrast.  There is some free fluid in
the cul-de-sac.
IMPRESSION: Low level uptake is seen at the right lower quadrant anterior
abdominal wall scar, the site of previous ileostomy.  This is
probably related to uptake secondary to healing of the ileostomy
site after takedown.

No focal areas of abnormal or unexpected uptake within the liver
parenchyma.

## 2012-05-06 MED ORDER — OXYCODONE HCL 5 MG PO TABS: 5.0000 mg | ORAL_TABLET | Freq: Four times a day (QID) | ORAL | Status: DC | PRN

## 2012-05-06 NOTE — Telephone Encounter (Signed)
Per staff message and POF I have scheduled appts.  JMW  

## 2012-05-08 ENCOUNTER — Telehealth: Payer: Self-pay | Admitting: Oncology

## 2012-05-08 NOTE — Telephone Encounter (Signed)
Sent dr Clelia Croft a staff message regarding this pt needing an appt with him on 05/28/2012. Pt has tx this day.

## 2012-05-09 ENCOUNTER — Telehealth: Payer: Self-pay | Admitting: Oncology

## 2012-05-09 NOTE — Telephone Encounter (Signed)
S/W THE PT AND SHE IS AWARE TO PICK UP A REVISED FEB APPT SCHEDULE

## 2012-05-14 ENCOUNTER — Ambulatory Visit (HOSPITAL_BASED_OUTPATIENT_CLINIC_OR_DEPARTMENT_OTHER): Payer: Medicaid Other

## 2012-05-14 ENCOUNTER — Ambulatory Visit (HOSPITAL_BASED_OUTPATIENT_CLINIC_OR_DEPARTMENT_OTHER): Payer: Medicaid Other | Admitting: Oncology

## 2012-05-14 ENCOUNTER — Other Ambulatory Visit (HOSPITAL_BASED_OUTPATIENT_CLINIC_OR_DEPARTMENT_OTHER): Payer: Medicaid Other | Admitting: Lab

## 2012-05-14 VITALS — BP 122/82 | HR 93 | Temp 97.1°F | Resp 20 | Ht 67.0 in | Wt 156.7 lb

## 2012-05-14 DIAGNOSIS — C7960 Secondary malignant neoplasm of unspecified ovary: Secondary | ICD-10-CM

## 2012-05-14 DIAGNOSIS — C787 Secondary malignant neoplasm of liver and intrahepatic bile duct: Secondary | ICD-10-CM

## 2012-05-14 DIAGNOSIS — C189 Malignant neoplasm of colon, unspecified: Secondary | ICD-10-CM

## 2012-05-14 DIAGNOSIS — Z5112 Encounter for antineoplastic immunotherapy: Secondary | ICD-10-CM

## 2012-05-14 DIAGNOSIS — C187 Malignant neoplasm of sigmoid colon: Secondary | ICD-10-CM

## 2012-05-14 DIAGNOSIS — G893 Neoplasm related pain (acute) (chronic): Secondary | ICD-10-CM

## 2012-05-14 LAB — CBC WITH DIFFERENTIAL/PLATELET
Basophils Absolute: 0.1 10*3/uL (ref 0.0–0.1)
LYMPH%: 24.2 % (ref 14.0–49.7)
MCHC: 32.3 g/dL (ref 31.5–36.0)
MONO#: 0.5 10*3/uL (ref 0.1–0.9)
NEUT#: 5.6 10*3/uL (ref 1.5–6.5)
NEUT%: 65.2 % (ref 38.4–76.8)
lymph#: 2.1 10*3/uL (ref 0.9–3.3)

## 2012-05-14 LAB — COMPREHENSIVE METABOLIC PANEL (CC13)
ALT: 8 U/L (ref 0–55)
AST: 10 U/L (ref 5–34)
Albumin: 3.6 g/dL (ref 3.5–5.0)
CO2: 27 mEq/L (ref 22–29)
Calcium: 9.4 mg/dL (ref 8.4–10.4)
Chloride: 106 mEq/L (ref 98–107)
Creatinine: 0.8 mg/dL (ref 0.6–1.1)
Potassium: 4.4 mEq/L (ref 3.5–5.1)
Sodium: 141 mEq/L (ref 136–145)
Total Protein: 6.8 g/dL (ref 6.4–8.3)

## 2012-05-14 LAB — UA PROTEIN, DIPSTICK - CHCC: Protein, ur: NEGATIVE mg/dL

## 2012-05-14 MED ORDER — ATROPINE SULFATE 1 MG/ML IJ SOLN
0.5000 mg | Freq: Once | INTRAMUSCULAR | Status: AC | PRN
  Administered 2012-05-14: 0.5 mg via INTRAVENOUS

## 2012-05-14 MED ORDER — IRINOTECAN HCL CHEMO INJECTION 100 MG/5ML
135.0000 mg/m2 | Freq: Once | INTRAVENOUS | Status: AC
  Administered 2012-05-14: 244 mg via INTRAVENOUS
  Filled 2012-05-14: qty 12.2

## 2012-05-14 MED ORDER — OXYCODONE HCL 5 MG PO TABS: 5.0000 mg | ORAL_TABLET | Freq: Four times a day (QID) | ORAL | Status: DC | PRN

## 2012-05-14 MED ORDER — SODIUM CHLORIDE 0.9 % IV SOLN
1800.0000 mg/m2 | INTRAVENOUS | Status: DC
  Administered 2012-05-14: 3250 mg via INTRAVENOUS
  Filled 2012-05-14: qty 65

## 2012-05-14 MED ORDER — SODIUM CHLORIDE 0.9 % IV SOLN
Freq: Once | INTRAVENOUS | Status: AC
  Administered 2012-05-14: 12:00:00 via INTRAVENOUS

## 2012-05-14 MED ORDER — FLUOROURACIL CHEMO INJECTION 2.5 GM/50ML
300.0000 mg/m2 | Freq: Once | INTRAVENOUS | Status: AC
  Administered 2012-05-14: 550 mg via INTRAVENOUS
  Filled 2012-05-14: qty 11

## 2012-05-14 MED ORDER — DEXTROSE 5 % IV SOLN
300.0000 mg/m2 | Freq: Once | INTRAVENOUS | Status: AC
  Administered 2012-05-14: 540 mg via INTRAVENOUS
  Filled 2012-05-14: qty 27

## 2012-05-14 MED ORDER — ONDANSETRON 16 MG/50ML IVPB (CHCC)
16.0000 mg | Freq: Once | INTRAVENOUS | Status: AC
  Administered 2012-05-14: 16 mg via INTRAVENOUS

## 2012-05-14 MED ORDER — DEXAMETHASONE SODIUM PHOSPHATE 4 MG/ML IJ SOLN
20.0000 mg | Freq: Once | INTRAMUSCULAR | Status: AC
  Administered 2012-05-14: 20 mg via INTRAVENOUS

## 2012-05-14 MED ORDER — SODIUM CHLORIDE 0.9 % IV SOLN
5.0000 mg/kg | Freq: Once | INTRAVENOUS | Status: AC
  Administered 2012-05-14: 350 mg via INTRAVENOUS
  Filled 2012-05-14: qty 14

## 2012-05-14 NOTE — Progress Notes (Signed)
Hematology and Oncology Follow Up Visit  [name] [id number] [birth year] 36 y.o. 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  Prior Therapy:  1. S/P laparoscopic assisted sigmoid colectomy on 08/05/10 for a 5 cm well-differentiated adenocarcinoma with 1/20 positive lymph nodes. 2. S/P temporary ileostomy which was reversed on 10/04/10. 3. Received FOLFOX 6 on 11/07/10 and received 2 cycles. She took a month off for personal issues and resumes treatment with a 15% dose reduction due to thrombocytopenia. Completed a total of 7 cycles on 03/07/11. She was thereafter lost to follow-up  Current therapy: FOLFIRI initiated on 02/28/12. She is here for cycle 3. She started Avastin with cycle 2.  Interim History:  [name] is seen for routine follow-up today prior to chemotherapy. She missed her appointment with 2 weeks ago [place] due to the weather. She continues to have right sided abdominal pain around to her flank. Uses Oxycodone 1-2 times per day, but more often when she has her period as her pain is exacerbated at that time. Had nausea and vomiting a few times during the night. Using Zofran and Compazine with relief. Stools alternate between diarrhea and constipation. Denies blood in her stool or urine. Denies chest pain, shortness of breath, dyspnea. Tolerated last cycle of chemo better with dose reduction.  Medications: I have reviewed the patient's current medications. Current outpatient prescriptions:albuterol (PROAIR HFA) 108 (90 BASE) MCG/ACT inhaler, Inhale 2 puffs into the lungs every 4 (four) hours as needed. Wheezing and shortness of breath, Disp: , Rfl: ;  dexamethasone (DECADRON) 4 MG tablet, Take 2 tablets (8 mg) daily for 3 days after chemotherapy then stop, Disp: 30 tablet, Rfl: 0 Docusate Sodium (STOOL SOFTENER) 100 MG capsule, Take 100 mg  by mouth 3 (three) times daily as needed. constipation, Disp: , Rfl: ;  esomeprazole (NEXIUM) 40 MG capsule, Take 40 mg by mouth daily before breakfast., Disp: , Rfl: ;  lidocaine-prilocaine (EMLA) cream, Apply topically as needed. To port a cath, Disp: 30 g, Rfl: 0;  LORazepam (ATIVAN) 0.5 MG tablet, 1tab(0.5mg ) po/sl q8hrs prn nausea/vomiting, Disp: 30 tablet, Rfl: 1 ondansetron (ZOFRAN) 8 MG tablet, Take 1 tablet twice a day, begin day after chemotherapy and take for 3 days. Then as needed for nausea, Disp: 30 tablet, Rfl: 2;  oxyCODONE (OXY IR/ROXICODONE) 5 MG immediate release tablet, Take 1-2 tablets (5-10 mg total) by mouth every 6 (six) hours as needed. Take 1 - 2 tabs po every  4 - 6 hrs  PRN Pain., Disp: 60 tablet, Rfl: 0 prochlorperazine (COMPAZINE) 25 MG suppository, Place 25 mg rectally as needed. Only with chemo for nausea, Disp: , Rfl: ;  propranolol (INDERAL) 40 MG tablet, Take 40 mg by mouth as needed. hypertension, Disp: , Rfl: ;  propylthiouracil (PTU) 50 MG tablet, Take 100 mg by mouth 3 (three) times daily. , Disp: , Rfl:  Current facility-administered medications:0.9 %  sodium chloride infusion, 500 mL, Intravenous, Continuous, Mardella Layman, MD  Allergies:  Allergies  Allergen Reactions  . Codeine Hives  . Penicillins Hives  . Sulfa Antibiotics Hives    Past Medical History, Surgical history, Social history, and Family History were reviewed and updated.  Review of Systems: Constitutional:  Negative for fever, chills, night sweats, anorexia, weight loss, pain. Cardiovascular: no chest pain or dyspnea on exertion Respiratory: no cough, shortness of breath, or wheezing Neurological:  no TIA or stroke symptoms Dermatological: negative ENT: negative Skin: Negative. Gastrointestinal: positive for - abdominal pain negative for - appetite loss or melena Genito-Urinary: no dysuria, trouble voiding, or hematuria Hematological and Lymphatic: negative Breast: negative for  breast lumps Musculoskeletal: negative Remaining ROS negative.  Physical Exam: Blood pressure 122/82, pulse 93, temperature 97.1 F (36.2 C), resp. rate 20, height 5\' 7"  (1.702 m), weight 156 lb 11.2 oz (71.079 kg). ECOG: 1 General appearance: alert, cooperative and no distress Head: Normocephalic, without obvious abnormality, atraumatic Neck: no adenopathy, no carotid bruit, no JVD, supple, symmetrical, trachea midline and thyroid not enlarged, symmetric, no tenderness/mass/nodules Lymph nodes: Cervical, supraclavicular, and axillary nodes normal. Heart:regular rate and rhythm, S1, S2 normal, no murmur, click, rub or gallop Lung:chest clear, no wheezing, rales, normal symmetric air entry, no tachypnea, retractions or cyanosis Abdomen: soft, normal bowel sounds and mass located in the lower abdomen EXT:no erythema, induration, or nodules   Lab Results: Lab Results  Component Value Date   WBC 8.5 05/14/2012   HGB 13.7 05/14/2012   HCT 42.3 05/14/2012   MCV 84.0 05/14/2012   PLT 195 05/14/2012     Chemistry      Component Value Date/Time   NA 141 05/14/2012 1018   NA 132* 03/06/2012 0345   K 4.4 05/14/2012 1018   K 4.0 03/06/2012 0345   CL 106 05/14/2012 1018   CL 95* 03/06/2012 0345   CO2 27 05/14/2012 1018   CO2 23 03/06/2012 0345   BUN 7.3 05/14/2012 1018   BUN 11 03/06/2012 0345   CREATININE 0.8 05/14/2012 1018   CREATININE 0.66 03/06/2012 0345      Component Value Date/Time   CALCIUM 9.4 05/14/2012 1018   CALCIUM 9.5 03/06/2012 0345   ALKPHOS 135 05/14/2012 1018   ALKPHOS 228* 03/06/2012 0345   AST 10 05/14/2012 1018   AST 17 03/06/2012 0345   ALT 8 05/14/2012 1018   ALT 22 03/06/2012 0345   BILITOT 0.53 05/14/2012 1018   BILITOT 0.3 03/06/2012 0345     Impression and Plan:  This is a 37 year old female with the following issues:  1. Stage IV colon cancer. Here for cycle 3 of FOLFIRI/Avastin today. Recommend that she proceed with chemo today without further dose reduction. Plan to  get restaging CT scans after the 4 th cycle of chemo to assess response.  2. Abdominal pain. Due to colon ca. I have refilled her Oxycodone today.  3. Nausea/vomiting prophylaxis. She has Zofran and Compazine at home.  4. Follow-up. In 2 weeks with labs and chemo.  Spent more than half the time coordinating care.    [place], [place] 2/11/201411:05 AM

## 2012-05-14 NOTE — Patient Instructions (Addendum)
[facility] Discharge Instructions for Patients Receiving Chemotherapy  Today you received the following chemotherapy agents Avastin/Camptosar/Leucovorin/5FU  To help prevent nausea and vomiting after your treatment, we encourage you to take your nausea medication as prescribed.  If you develop nausea and vomiting that is not controlled by your nausea medication, call the clinic. If it is after clinic hours your family physician or the after hours number for the clinic or go to the Emergency Department.   BELOW ARE SYMPTOMS THAT SHOULD BE REPORTED IMMEDIATELY:  *FEVER GREATER THAN 100.5 F  *CHILLS WITH OR WITHOUT FEVER  NAUSEA AND VOMITING THAT IS NOT CONTROLLED WITH YOUR NAUSEA MEDICATION  *UNUSUAL SHORTNESS OF BREATH  *UNUSUAL BRUISING OR BLEEDING  TENDERNESS IN MOUTH AND THROAT WITH OR WITHOUT PRESENCE OF ULCERS  *URINARY PROBLEMS  *BOWEL PROBLEMS  UNUSUAL RASH Items with * indicate a potential emergency and should be followed up as soon as possible.  Feel free to call the clinic you have any questions or concerns. The clinic phone number is [phone].   I have been informed and understand all the instructions given to me. I know to contact the clinic, my physician, or go to the Emergency Department if any problems should occur. I do not have any questions at this time, but understand that I may call the clinic during office hours   should I have any questions or need assistance in obtaining follow up care.    __________________________________________  _____________  __________ Signature of Patient or Authorized Representative            Date                   Time    __________________________________________ Nurse's Signature

## 2012-05-16 ENCOUNTER — Ambulatory Visit (HOSPITAL_BASED_OUTPATIENT_CLINIC_OR_DEPARTMENT_OTHER): Payer: Medicaid Other

## 2012-05-16 DIAGNOSIS — C7960 Secondary malignant neoplasm of unspecified ovary: Secondary | ICD-10-CM

## 2012-05-16 DIAGNOSIS — C189 Malignant neoplasm of colon, unspecified: Secondary | ICD-10-CM

## 2012-05-16 DIAGNOSIS — C787 Secondary malignant neoplasm of liver and intrahepatic bile duct: Secondary | ICD-10-CM

## 2012-05-16 DIAGNOSIS — C187 Malignant neoplasm of sigmoid colon: Secondary | ICD-10-CM

## 2012-05-16 MED ORDER — PEGFILGRASTIM INJECTION 6 MG/0.6ML
6.0000 mg | Freq: Once | SUBCUTANEOUS | Status: AC
  Administered 2012-05-16: 6 mg via SUBCUTANEOUS
  Filled 2012-05-16: qty 0.6

## 2012-05-16 MED ORDER — HEPARIN SOD (PORK) LOCK FLUSH 100 UNIT/ML IV SOLN
500.0000 [IU] | Freq: Once | INTRAVENOUS | Status: AC | PRN
  Administered 2012-05-16: 500 [IU]
  Filled 2012-05-16: qty 5

## 2012-05-16 MED ORDER — SODIUM CHLORIDE 0.9 % IJ SOLN
10.0000 mL | INTRAMUSCULAR | Status: DC | PRN
  Administered 2012-05-16: 10 mL
  Filled 2012-05-16: qty 10

## 2012-05-22 ENCOUNTER — Other Ambulatory Visit: Payer: Self-pay | Admitting: Oncology

## 2012-05-22 DIAGNOSIS — C189 Malignant neoplasm of colon, unspecified: Secondary | ICD-10-CM

## 2012-05-22 MED ORDER — OXYCODONE HCL 5 MG PO TABS: 5.0000 mg | ORAL_TABLET | Freq: Four times a day (QID) | ORAL | Status: DC | PRN

## 2012-05-28 ENCOUNTER — Ambulatory Visit (HOSPITAL_BASED_OUTPATIENT_CLINIC_OR_DEPARTMENT_OTHER): Payer: Medicaid Other | Admitting: Oncology

## 2012-05-28 ENCOUNTER — Ambulatory Visit (HOSPITAL_BASED_OUTPATIENT_CLINIC_OR_DEPARTMENT_OTHER): Payer: Medicaid Other

## 2012-05-28 ENCOUNTER — Other Ambulatory Visit (HOSPITAL_BASED_OUTPATIENT_CLINIC_OR_DEPARTMENT_OTHER): Payer: Medicaid Other | Admitting: Lab

## 2012-05-28 VITALS — BP 123/77 | HR 100 | Temp 97.4°F | Resp 20 | Wt 158.1 lb

## 2012-05-28 DIAGNOSIS — C189 Malignant neoplasm of colon, unspecified: Secondary | ICD-10-CM

## 2012-05-28 DIAGNOSIS — C787 Secondary malignant neoplasm of liver and intrahepatic bile duct: Secondary | ICD-10-CM

## 2012-05-28 DIAGNOSIS — Z5112 Encounter for antineoplastic immunotherapy: Secondary | ICD-10-CM

## 2012-05-28 DIAGNOSIS — C187 Malignant neoplasm of sigmoid colon: Secondary | ICD-10-CM

## 2012-05-28 DIAGNOSIS — C180 Malignant neoplasm of cecum: Secondary | ICD-10-CM

## 2012-05-28 DIAGNOSIS — C7960 Secondary malignant neoplasm of unspecified ovary: Secondary | ICD-10-CM

## 2012-05-28 DIAGNOSIS — R109 Unspecified abdominal pain: Secondary | ICD-10-CM

## 2012-05-28 DIAGNOSIS — Z5111 Encounter for antineoplastic chemotherapy: Secondary | ICD-10-CM

## 2012-05-28 LAB — CBC WITH DIFFERENTIAL/PLATELET
Basophils Absolute: 0.1 10*3/uL (ref 0.0–0.1)
EOS%: 3.3 % (ref 0.0–7.0)
Eosinophils Absolute: 0.4 10*3/uL (ref 0.0–0.5)
HCT: 44.1 % (ref 34.8–46.6)
HGB: 14.6 g/dL (ref 11.6–15.9)
MCH: 28 pg (ref 25.1–34.0)
MONO#: 0.4 10*3/uL (ref 0.1–0.9)
NEUT#: 8.2 10*3/uL — ABNORMAL HIGH (ref 1.5–6.5)
NEUT%: 73.1 % (ref 38.4–76.8)
lymph#: 2.1 10*3/uL (ref 0.9–3.3)

## 2012-05-28 LAB — COMPREHENSIVE METABOLIC PANEL (CC13)
Albumin: 3.6 g/dL (ref 3.5–5.0)
BUN: 7.5 mg/dL (ref 7.0–26.0)
CO2: 25 mEq/L (ref 22–29)
Calcium: 9.5 mg/dL (ref 8.4–10.4)
Chloride: 104 mEq/L (ref 98–107)
Creatinine: 0.8 mg/dL (ref 0.6–1.1)
Glucose: 131 mg/dl — ABNORMAL HIGH (ref 70–99)

## 2012-05-28 MED ORDER — LORAZEPAM 0.5 MG PO TABS: ORAL_TABLET | ORAL | Status: DC

## 2012-05-28 MED ORDER — SODIUM CHLORIDE 0.9 % IV SOLN
1800.0000 mg/m2 | INTRAVENOUS | Status: DC
  Administered 2012-05-28: 3250 mg via INTRAVENOUS
  Filled 2012-05-28: qty 65

## 2012-05-28 MED ORDER — SODIUM CHLORIDE 0.9 % IV SOLN
Freq: Once | INTRAVENOUS | Status: AC
  Administered 2012-05-28: 11:00:00 via INTRAVENOUS

## 2012-05-28 MED ORDER — OXYCODONE HCL 5 MG PO TABS: 5.0000 mg | ORAL_TABLET | Freq: Four times a day (QID) | ORAL | Status: DC | PRN

## 2012-05-28 MED ORDER — SODIUM CHLORIDE 0.9 % IV SOLN
5.0000 mg/kg | Freq: Once | INTRAVENOUS | Status: AC
  Administered 2012-05-28: 350 mg via INTRAVENOUS
  Filled 2012-05-28: qty 14

## 2012-05-28 MED ORDER — FLUOROURACIL CHEMO INJECTION 2.5 GM/50ML
300.0000 mg/m2 | Freq: Once | INTRAVENOUS | Status: AC
  Administered 2012-05-28: 550 mg via INTRAVENOUS
  Filled 2012-05-28: qty 11

## 2012-05-28 MED ORDER — IRINOTECAN HCL CHEMO INJECTION 100 MG/5ML
135.0000 mg/m2 | Freq: Once | INTRAVENOUS | Status: AC
  Administered 2012-05-28: 244 mg via INTRAVENOUS
  Filled 2012-05-28: qty 12.2

## 2012-05-28 MED ORDER — ONDANSETRON 16 MG/50ML IVPB (CHCC)
16.0000 mg | Freq: Once | INTRAVENOUS | Status: AC
Start: 2012-05-28 — End: 2012-05-28
  Administered 2012-05-28: 16 mg via INTRAVENOUS

## 2012-05-28 MED ORDER — ATROPINE SULFATE 1 MG/ML IJ SOLN
0.5000 mg | Freq: Once | INTRAMUSCULAR | Status: AC | PRN
  Administered 2012-05-28: 11:00:00 via INTRAVENOUS

## 2012-05-28 MED ORDER — LEUCOVORIN CALCIUM INJECTION 350 MG
300.0000 mg/m2 | Freq: Once | INTRAVENOUS | Status: AC
  Administered 2012-05-28: 540 mg via INTRAVENOUS
  Filled 2012-05-28: qty 27

## 2012-05-28 MED ORDER — DEXAMETHASONE SODIUM PHOSPHATE 4 MG/ML IJ SOLN
20.0000 mg | Freq: Once | INTRAMUSCULAR | Status: AC
  Administered 2012-05-28: 20 mg via INTRAVENOUS

## 2012-05-28 NOTE — Patient Instructions (Addendum)
[facility] Discharge Instructions for Patients Receiving Chemotherapy  Today you received the following chemotherapy agents avstin/ camptosar/leucovorin/5FU  To help prevent nausea and vomiting after your treatment, we encourage you to take your nausea medication  and take it as often as prescribedIf you develop nausea and vomiting that is not controlled by your nausea medication, call the clinic. If it is after clinic hours your family physician or the after hours number for the clinic or go to the Emergency Department.   BELOW ARE SYMPTOMS THAT SHOULD BE REPORTED IMMEDIATELY:  *FEVER GREATER THAN 100.5 F  *CHILLS WITH OR WITHOUT FEVER  NAUSEA AND VOMITING THAT IS NOT CONTROLLED WITH YOUR NAUSEA MEDICATION  *UNUSUAL SHORTNESS OF BREATH  *UNUSUAL BRUISING OR BLEEDING  TENDERNESS IN MOUTH AND THROAT WITH OR WITHOUT PRESENCE OF ULCERS  *URINARY PROBLEMS  *BOWEL PROBLEMS  UNUSUAL RASH Items with * indicate a potential emergency and should be followed up as soon as possible.  One of the nurses will contact you 24 hours after your treatment. Please let the nurse know about any problems that you may have experienced. Feel free to call the clinic you have any questions or concerns. The clinic phone number is [phone].   I have been informed and understand all the instructions given to me. I know to contact the clinic, my physician, or go to the Emergency Department if any problems should occur. I do not have any questions at this time, but understand that I may call the clinic during office hours   should I have any questions or need assistance in obtaining follow up care.    __________________________________________  _____________  __________ Signature of Patient or Authorized Representative            Date                   Time    __________________________________________ Nurse's Signature

## 2012-05-28 NOTE — Progress Notes (Signed)
Hematology and Oncology Follow Up Visit  [name] [id number] Nov 27, 1975 36 y.o. 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  Prior Therapy:  1. S/P laparoscopic assisted sigmoid colectomy on 08/05/10 for a 5 cm well-differentiated adenocarcinoma with 1/20 positive lymph nodes. 2. S/P temporary ileostomy which was reversed on 10/04/10. 3. Received FOLFOX 6 on 11/07/10 and received 2 cycles. She took a month off for personal issues and resumes treatment with a 15% dose reduction due to thrombocytopenia. Completed a total of 7 cycles on 03/07/11. She was thereafter lost to follow-up  Current therapy: FOLFIRI initiated on 02/28/12. She is here for cycle 4. She started Avastin with cycle 2.  Interim History:  [name] is seen for routine follow-up today prior to chemotherapy. She has tolerated chemotherapy well without complications. She continues to have right sided abdominal pain around to her flank. Uses Oxycodone 1-2 times per day. Had nausea and vomiting a few times during the night. Using Zofran and Compazine with relief. Stools alternate between diarrhea and constipation. Denies blood in her stool or urine. Denies chest pain, shortness of breath, dyspnea. Tolerated last cycle of chemo better with dose reduction overall.   Medications: I have reviewed the patient's current medications. Current outpatient prescriptions:albuterol (PROAIR HFA) 108 (90 BASE) MCG/ACT inhaler, Inhale 2 puffs into the lungs every 4 (four) hours as needed. Wheezing and shortness of breath, Disp: , Rfl: ;  dexamethasone (DECADRON) 4 MG tablet, Take 2 tablets (8 mg) daily for 3 days after chemotherapy then stop, Disp: 30 tablet, Rfl: 0 Docusate Sodium (STOOL SOFTENER) 100 MG capsule, Take 100 mg by mouth 3 (three) times daily as needed. constipation, Disp: , Rfl: ;   esomeprazole (NEXIUM) 40 MG capsule, Take 40 mg by mouth daily before breakfast., Disp: , Rfl: ;  lidocaine-prilocaine (EMLA) cream, Apply topically as needed. To port a cath, Disp: 30 g, Rfl: 0;  LORazepam (ATIVAN) 0.5 MG tablet, 1tab(0.5mg ) po/sl q8hrs prn nausea/vomiting, Disp: 30 tablet, Rfl: 1 ondansetron (ZOFRAN) 8 MG tablet, Take 1 tablet twice a day, begin day after chemotherapy and take for 3 days. Then as needed for nausea, Disp: 30 tablet, Rfl: 2;  oxyCODONE (OXY IR/ROXICODONE) 5 MG immediate release tablet, Take 1-2 tablets (5-10 mg total) by mouth every 6 (six) hours as needed., Disp: 50 tablet, Rfl: 0;  prochlorperazine (COMPAZINE) 25 MG suppository, Place 25 mg rectally as needed. Only with chemo for nausea, Disp: , Rfl:  propranolol (INDERAL) 40 MG tablet, Take 40 mg by mouth as needed. hypertension, Disp: , Rfl: ;  propylthiouracil (PTU) 50 MG tablet, Take 100 mg by mouth 3 (three) times daily. , Disp: , Rfl:  Current facility-administered medications:0.9 %  sodium chloride infusion, 500 mL, Intravenous, Continuous, Mardella Layman, MD  Allergies:  Allergies  Allergen Reactions  . Codeine Hives  . Penicillins Hives  . Sulfa Antibiotics Hives    Past Medical History, Surgical history, Social history, and Family History were reviewed and updated.  Review of Systems: Constitutional:  Negative for fever, chills, night sweats, anorexia, weight loss, pain. Cardiovascular: no chest pain or dyspnea on exertion Respiratory: no cough, shortness of breath, or wheezing Neurological: no TIA or stroke symptoms Dermatological: negative ENT: negative Skin: Negative. Gastrointestinal: positive for - abdominal pain negative for - appetite loss or melena Genito-Urinary: no dysuria, trouble voiding, or hematuria Hematological and Lymphatic:  negative Breast: negative for breast lumps Musculoskeletal: negative Remaining ROS negative.  Physical Exam: Blood pressure 123/77, pulse 100,  temperature 97.4 F (36.3 C), temperature source Oral, resp. rate 20, weight 158 lb 2 oz (71.725 kg). ECOG: 1 General appearance: alert, cooperative and no distress Head: Normocephalic, without obvious abnormality, atraumatic Neck: no adenopathy, no carotid bruit, no JVD, supple, symmetrical, trachea midline and thyroid not enlarged, symmetric, no tenderness/mass/nodules Lymph nodes: Cervical, supraclavicular, and axillary nodes normal. Heart:regular rate and rhythm, S1, S2 normal, no murmur, click, rub or gallop Lung:chest clear, no wheezing, rales, normal symmetric air entry, no tachypnea, retractions or cyanosis Abdomen: soft, normal bowel sounds and mass located in the lower abdomen EXT:no erythema, induration, or nodules   Lab Results: Lab Results  Component Value Date   WBC 11.1* 05/28/2012   HGB 14.6 05/28/2012   HCT 44.1 05/28/2012   MCV 84.3 05/28/2012   PLT 142* 05/28/2012     Chemistry      Component Value Date/Time   NA 138 05/28/2012 1005   NA 132* 03/06/2012 0345   K 4.5 05/28/2012 1005   K 4.0 03/06/2012 0345   CL 104 05/28/2012 1005   CL 95* 03/06/2012 0345   CO2 25 05/28/2012 1005   CO2 23 03/06/2012 0345   BUN 7.5 05/28/2012 1005   BUN 11 03/06/2012 0345   CREATININE 0.8 05/28/2012 1005   CREATININE 0.66 03/06/2012 0345      Component Value Date/Time   CALCIUM 9.5 05/28/2012 1005   CALCIUM 9.5 03/06/2012 0345   ALKPHOS 181* 05/28/2012 1005   ALKPHOS 228* 03/06/2012 0345   AST 10 05/28/2012 1005   AST 17 03/06/2012 0345   ALT 10 05/28/2012 1005   ALT 22 03/06/2012 0345   BILITOT 0.51 05/28/2012 1005   BILITOT 0.3 03/06/2012 0345     Impression and Plan:  This is a 37 year old female with the following issues:  1. Stage IV colon cancer. Here for cycle 4 of FOLFIRI/Avastin today. Recommend that she proceed with chemo today without further dose reduction. Plan to get restaging CT scans after the 5 th cycle of chemo to assess response.  2. Abdominal pain. Due to colon ca. I  have refilled her Oxycodone today.  3. Nausea/vomiting prophylaxis. She has Zofran and Compazine at home.  4. Follow-up. In 2 weeks with labs and chemo.     [name] 2/25/201410:45 AM

## 2012-05-30 ENCOUNTER — Ambulatory Visit (HOSPITAL_BASED_OUTPATIENT_CLINIC_OR_DEPARTMENT_OTHER): Payer: Medicaid Other

## 2012-05-30 VITALS — BP 123/75 | HR 104 | Temp 98.6°F

## 2012-05-30 DIAGNOSIS — C7960 Secondary malignant neoplasm of unspecified ovary: Secondary | ICD-10-CM

## 2012-05-30 DIAGNOSIS — C189 Malignant neoplasm of colon, unspecified: Secondary | ICD-10-CM

## 2012-05-30 DIAGNOSIS — C787 Secondary malignant neoplasm of liver and intrahepatic bile duct: Secondary | ICD-10-CM

## 2012-05-30 DIAGNOSIS — C187 Malignant neoplasm of sigmoid colon: Secondary | ICD-10-CM

## 2012-05-30 MED ORDER — SODIUM CHLORIDE 0.9 % IJ SOLN
10.0000 mL | INTRAMUSCULAR | Status: DC | PRN
  Administered 2012-05-30: 10 mL
  Filled 2012-05-30: qty 10

## 2012-05-30 MED ORDER — PEGFILGRASTIM INJECTION 6 MG/0.6ML
6.0000 mg | Freq: Once | SUBCUTANEOUS | Status: AC
  Administered 2012-05-30: 6 mg via SUBCUTANEOUS
  Filled 2012-05-30: qty 0.6

## 2012-05-30 MED ORDER — HEPARIN SOD (PORK) LOCK FLUSH 100 UNIT/ML IV SOLN
500.0000 [IU] | Freq: Once | INTRAVENOUS | Status: AC | PRN
  Administered 2012-05-30: 500 [IU]
  Filled 2012-05-30: qty 5

## 2012-05-31 ENCOUNTER — Telehealth: Payer: Self-pay | Admitting: *Deleted

## 2012-05-31 NOTE — Telephone Encounter (Signed)
Later entry----Patient request to have appts to be earlier in the day. I have sent email to the MD/APP to advise on appts. Per APP patient can come the day before. I have called and left the patietn a message to call me back.  JMW

## 2012-06-04 ENCOUNTER — Telehealth: Payer: Self-pay | Admitting: *Deleted

## 2012-06-04 ENCOUNTER — Other Ambulatory Visit: Payer: Self-pay | Admitting: *Deleted

## 2012-06-04 DIAGNOSIS — C189 Malignant neoplasm of colon, unspecified: Secondary | ICD-10-CM

## 2012-06-04 MED ORDER — OXYCODONE HCL 5 MG PO TABS: 5.0000 mg | ORAL_TABLET | Freq: Four times a day (QID) | ORAL | Status: DC | PRN

## 2012-06-04 NOTE — Telephone Encounter (Signed)
Pt returned call request refill on Oxycodone 5mg  IR. Will review with MD.

## 2012-06-04 NOTE — Telephone Encounter (Signed)
Patient left VM requesting refill on "pain medication". Message forwarded to Dr. Alver Fisher nurse.

## 2012-06-04 NOTE — Telephone Encounter (Signed)
Ok to refill Pain med per MD. Jeanene Erb advised pt rx  Ready for pick up

## 2012-06-04 NOTE — Telephone Encounter (Signed)
Message received from Desk Rn pt called requesting refill on pain medication. Returned pt's call, no answer. LMOVM for pt to call back with additional information.

## 2012-06-11 ENCOUNTER — Ambulatory Visit: Payer: Medicaid Other

## 2012-06-11 ENCOUNTER — Encounter: Payer: Self-pay | Admitting: Oncology

## 2012-06-11 ENCOUNTER — Other Ambulatory Visit (HOSPITAL_BASED_OUTPATIENT_CLINIC_OR_DEPARTMENT_OTHER): Payer: Medicaid Other | Admitting: Lab

## 2012-06-11 ENCOUNTER — Ambulatory Visit (HOSPITAL_BASED_OUTPATIENT_CLINIC_OR_DEPARTMENT_OTHER): Payer: Medicaid Other | Admitting: Oncology

## 2012-06-11 VITALS — BP 118/77 | HR 85 | Temp 98.0°F | Resp 18 | Ht 67.0 in | Wt 161.0 lb

## 2012-06-11 DIAGNOSIS — C187 Malignant neoplasm of sigmoid colon: Secondary | ICD-10-CM

## 2012-06-11 DIAGNOSIS — R109 Unspecified abdominal pain: Secondary | ICD-10-CM

## 2012-06-11 DIAGNOSIS — C189 Malignant neoplasm of colon, unspecified: Secondary | ICD-10-CM

## 2012-06-11 DIAGNOSIS — C7960 Secondary malignant neoplasm of unspecified ovary: Secondary | ICD-10-CM

## 2012-06-11 DIAGNOSIS — C787 Secondary malignant neoplasm of liver and intrahepatic bile duct: Secondary | ICD-10-CM

## 2012-06-11 LAB — COMPREHENSIVE METABOLIC PANEL (CC13)
ALT: 14 U/L (ref 0–55)
AST: 16 U/L (ref 5–34)
Albumin: 3.4 g/dL — ABNORMAL LOW (ref 3.5–5.0)
Alkaline Phosphatase: 169 U/L — ABNORMAL HIGH (ref 40–150)
Glucose: 100 mg/dl — ABNORMAL HIGH (ref 70–99)
Potassium: 4 mEq/L (ref 3.5–5.1)
Sodium: 141 mEq/L (ref 136–145)
Total Protein: 6.7 g/dL (ref 6.4–8.3)

## 2012-06-11 LAB — CBC WITH DIFFERENTIAL/PLATELET
Eosinophils Absolute: 0.3 10*3/uL (ref 0.0–0.5)
MCV: 84.9 fL (ref 79.5–101.0)
MONO%: 6.8 % (ref 0.0–14.0)
NEUT#: 5 10*3/uL (ref 1.5–6.5)
RBC: 4.76 10*6/uL (ref 3.70–5.45)
RDW: 16.7 % — ABNORMAL HIGH (ref 11.2–14.5)
WBC: 7.4 10*3/uL (ref 3.9–10.3)

## 2012-06-11 MED ORDER — OXYCODONE HCL 5 MG PO TABS: 5.0000 mg | ORAL_TABLET | Freq: Four times a day (QID) | ORAL | Status: DC | PRN

## 2012-06-11 MED ORDER — FLUCONAZOLE 100 MG PO TABS: 100.0000 mg | ORAL_TABLET | Freq: Every day | ORAL | Status: DC

## 2012-06-11 MED ORDER — GUAIFENESIN ER 600 MG PO TB12: 600.0000 mg | ORAL_TABLET | Freq: Two times a day (BID) | ORAL | Status: DC

## 2012-06-11 MED ORDER — LEVOFLOXACIN 500 MG PO TABS: 500.0000 mg | ORAL_TABLET | Freq: Every day | ORAL | Status: DC

## 2012-06-11 NOTE — Progress Notes (Signed)
Hematology and Oncology Follow Up Visit  [name] [id number] Jan 24, 1976 36 y.o. 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  Prior Therapy:  1. S/P laparoscopic assisted sigmoid colectomy on 08/05/10 for a 5 cm well-differentiated adenocarcinoma with 1/20 positive lymph nodes. 2. S/P temporary ileostomy which was reversed on 10/04/10. 3. Received FOLFOX 6 on 11/07/10 and received 2 cycles. She took a month off for personal issues and resumes treatment with a 15% dose reduction due to thrombocytopenia. Completed a total of 7 cycles on 03/07/11. She was thereafter lost to follow-up  Current therapy: FOLFIRI initiated on 02/28/12. She is here for cycle 5. She started Avastin with cycle 2.  Interim History:  [name] is seen for routine follow-up today prior to chemotherapy. She has tolerated chemotherapy well without complications. She continues to have right sided abdominal pain around to her flank. Uses Oxycodone 1-2 times per day. Had nausea and vomiting a few times during the night. Using Zofran and Compazine with relief. Stools alternate between diarrhea and constipation. Denies blood in her stool or urine. Denies chest pain, shortness of breath, dyspnea. Tolerated last cycle of chemo better with dose reduction overall. State she has been having cough, facial pain, and low grade fevers for 4 days. On note, child has confirmed influenza. Patient did not receive flu vaccine this year. Concerned that she has a yeast infection. Has used Monistat, but symptoms are not better.  Medications: I have reviewed the patient's current medications. Current outpatient prescriptions:albuterol (PROAIR HFA) 108 (90 BASE) MCG/ACT inhaler, Inhale 2 puffs into the lungs every 4 (four) hours as needed. Wheezing and shortness of breath, Disp: , Rfl: ;  dexamethasone  (DECADRON) 4 MG tablet, Take 2 tablets (8 mg) daily for 3 days after chemotherapy then stop, Disp: 30 tablet, Rfl: 0 Docusate Sodium (STOOL SOFTENER) 100 MG capsule, Take 100 mg by mouth 3 (three) times daily as needed. constipation, Disp: , Rfl: ;  esomeprazole (NEXIUM) 40 MG capsule, Take 40 mg by mouth daily before breakfast., Disp: , Rfl: ;  fluconazole (DIFLUCAN) 100 MG tablet, Take 1 tablet (100 mg total) by mouth daily., Disp: 7 tablet, Rfl: 0 guaiFENesin (MUCINEX) 600 MG 12 hr tablet, Take 1 tablet (600 mg total) by mouth 2 (two) times daily., Disp: 20 tablet, Rfl: 0;  levofloxacin (LEVAQUIN) 500 MG tablet, Take 1 tablet (500 mg total) by mouth daily., Disp: 10 tablet, Rfl: 0;  lidocaine-prilocaine (EMLA) cream, Apply topically as needed. To port a cath, Disp: 30 g, Rfl: 0;  LORazepam (ATIVAN) 0.5 MG tablet, 1tab(0.5mg ) po/sl q8hrs prn nausea/vomiting, Disp: 30 tablet, Rfl: 1 ondansetron (ZOFRAN) 8 MG tablet, Take 1 tablet twice a day, begin day after chemotherapy and take for 3 days. Then as needed for nausea, Disp: 30 tablet, Rfl: 2;  oxyCODONE (OXY IR/ROXICODONE) 5 MG immediate release tablet, Take 1-2 tablets (5-10 mg total) by mouth every 6 (six) hours as needed., Disp: 50 tablet, Rfl: 0;  prochlorperazine (COMPAZINE) 25 MG suppository, Place 25 mg rectally as needed. Only with chemo for nausea, Disp: , Rfl:  propranolol (INDERAL) 40 MG tablet, Take 40 mg by mouth as needed. hypertension, Disp: , Rfl: ;  propylthiouracil (PTU) 50 MG tablet, Take 100 mg by mouth 3 (three) times daily. , Disp: , Rfl:  Current facility-administered medications:0.9 %  sodium chloride infusion, 500 mL, Intravenous, Continuous, [name]  Jarold Motto, MD  Allergies:  Allergies  Allergen Reactions  . Codeine Hives  . Penicillins Hives  . Sulfa Antibiotics Hives    Past Medical History, Surgical history, Social history, and Family History were reviewed and updated.  Review of Systems: Constitutional:  Negative for  chills, night sweats, anorexia, weight loss, pain. Cardiovascular: no chest pain or dyspnea on exertion Respiratory: no cough, shortness of breath, or wheezing Neurological: no TIA or stroke symptoms Dermatological: negative ENT: negative Skin: Negative. Gastrointestinal: positive for - abdominal pain negative for - appetite loss or melena Genito-Urinary: no dysuria, trouble voiding, or hematuria Hematological and Lymphatic: negative Breast: negative for breast lumps Musculoskeletal: negative Remaining ROS negative.  Physical Exam: Blood pressure 118/77, pulse 85, temperature 98 F (36.7 C), temperature source Oral, resp. rate 18, height 5\' 7"  (1.702 m), weight 161 lb (73.029 kg). ECOG: 1 General appearance: alert, cooperative and no distress Head: Normocephalic, without obvious abnormality, atraumatic Neck: no adenopathy, no carotid bruit, no JVD, supple, symmetrical, trachea midline and thyroid not enlarged, symmetric, no tenderness/mass/nodules Lymph nodes: Cervical, supraclavicular, and axillary nodes normal. Heart:regular rate and rhythm, S1, S2 normal, no murmur, click, rub or gallop Lung:chest with expiratory wheezing. No rales, normal symmetric air entry, no tachypnea, retractions or cyanosis Abdomen: soft, normal bowel sounds and mass located in the lower abdomen EXT:no erythema, induration, or nodules   Lab Results: Lab Results  Component Value Date   WBC 7.4 06/11/2012   HGB 13.4 06/11/2012   HCT 40.4 06/11/2012   MCV 84.9 06/11/2012   PLT 137* 06/11/2012     Chemistry      Component Value Date/Time   NA 141 06/11/2012 1023   NA 132* 03/06/2012 0345   K 4.0 06/11/2012 1023   K 4.0 03/06/2012 0345   CL 106 06/11/2012 1023   CL 95* 03/06/2012 0345   CO2 27 06/11/2012 1023   CO2 23 03/06/2012 0345   BUN 6.2* 06/11/2012 1023   BUN 11 03/06/2012 0345   CREATININE 0.8 06/11/2012 1023   CREATININE 0.66 03/06/2012 0345      Component Value Date/Time   CALCIUM 9.0 06/11/2012  1023   CALCIUM 9.5 03/06/2012 0345   ALKPHOS 169* 06/11/2012 1023   ALKPHOS 228* 03/06/2012 0345   AST 16 06/11/2012 1023   AST 17 03/06/2012 0345   ALT 14 06/11/2012 1023   ALT 22 03/06/2012 0345   BILITOT 0.25 06/11/2012 1023   BILITOT 0.3 03/06/2012 0345     Impression and Plan:  This is a 37 year old female with the following issues:  1. Stage IV colon cancer. Here for cycle 5 of FOLFIRI/Avastin today. Recommend that we hold chemo today given her upper respiratory symptoms and fever. Plan to get restaging CT scans prior to her next visit as scheduled to assess response.  2. Abdominal pain. Due to colon ca. I have refilled her Oxycodone today.  3. Nausea/vomiting prophylaxis. She has Zofran and Compazine at home.  4. Sinusitis. I have given her a prescription for Levaquin and Mucinex.  5. Yeast infection. I have given her Diflucan.  6. Follow-up. In 2 weeks with labs and chemo.     Clenton Pare 3/11/20142:38 PM

## 2012-06-17 ENCOUNTER — Ambulatory Visit (HOSPITAL_COMMUNITY)
Admission: RE | Admit: 2012-06-17 | Discharge: 2012-06-17 | Disposition: A | Discharge status: Home / Self Care | Payer: Medicaid Other | Source: Ambulatory Visit | Attending: Oncology | Admitting: Oncology

## 2012-06-18 ENCOUNTER — Other Ambulatory Visit: Payer: Self-pay | Admitting: *Deleted

## 2012-06-18 DIAGNOSIS — C189 Malignant neoplasm of colon, unspecified: Secondary | ICD-10-CM

## 2012-06-18 MED ORDER — OXYCODONE HCL 5 MG PO TABS: 5.0000 mg | ORAL_TABLET | Freq: Four times a day (QID) | ORAL | Status: DC | PRN

## 2012-06-20 ENCOUNTER — Ambulatory Visit (HOSPITAL_COMMUNITY)
Admission: RE | Admit: 2012-06-20 | Discharge: 2012-06-20 | Disposition: A | Discharge status: Home / Self Care | Payer: Medicaid Other | Source: Ambulatory Visit | Attending: Oncology | Admitting: Oncology

## 2012-06-20 ENCOUNTER — Ambulatory Visit (HOSPITAL_COMMUNITY): Payer: Medicaid Other

## 2012-06-20 DIAGNOSIS — R1031 Right lower quadrant pain: Secondary | ICD-10-CM | POA: Insufficient documentation

## 2012-06-20 DIAGNOSIS — C787 Secondary malignant neoplasm of liver and intrahepatic bile duct: Secondary | ICD-10-CM | POA: Insufficient documentation

## 2012-06-20 DIAGNOSIS — R1909 Other intra-abdominal and pelvic swelling, mass and lump: Secondary | ICD-10-CM | POA: Insufficient documentation

## 2012-06-20 DIAGNOSIS — C189 Malignant neoplasm of colon, unspecified: Secondary | ICD-10-CM | POA: Insufficient documentation

## 2012-06-20 DIAGNOSIS — R911 Solitary pulmonary nodule: Secondary | ICD-10-CM | POA: Insufficient documentation

## 2012-06-20 MED ORDER — IOHEXOL 300 MG/ML  SOLN
100.0000 mL | Freq: Once | INTRAMUSCULAR | Status: AC | PRN
  Administered 2012-06-20: 100 mL via INTRAVENOUS

## 2012-06-25 ENCOUNTER — Encounter: Payer: Self-pay | Admitting: *Deleted

## 2012-06-25 ENCOUNTER — Other Ambulatory Visit (HOSPITAL_BASED_OUTPATIENT_CLINIC_OR_DEPARTMENT_OTHER): Payer: Medicaid Other | Admitting: Lab

## 2012-06-25 ENCOUNTER — Ambulatory Visit (HOSPITAL_BASED_OUTPATIENT_CLINIC_OR_DEPARTMENT_OTHER): Payer: Medicaid Other | Admitting: Oncology

## 2012-06-25 ENCOUNTER — Ambulatory Visit: Payer: Medicaid Other

## 2012-06-25 VITALS — BP 121/80 | HR 95 | Temp 97.9°F | Resp 20 | Ht 67.0 in | Wt 158.8 lb

## 2012-06-25 DIAGNOSIS — R109 Unspecified abdominal pain: Secondary | ICD-10-CM

## 2012-06-25 DIAGNOSIS — C189 Malignant neoplasm of colon, unspecified: Secondary | ICD-10-CM

## 2012-06-25 DIAGNOSIS — C7960 Secondary malignant neoplasm of unspecified ovary: Secondary | ICD-10-CM

## 2012-06-25 DIAGNOSIS — C187 Malignant neoplasm of sigmoid colon: Secondary | ICD-10-CM

## 2012-06-25 DIAGNOSIS — C787 Secondary malignant neoplasm of liver and intrahepatic bile duct: Secondary | ICD-10-CM

## 2012-06-25 LAB — COMPREHENSIVE METABOLIC PANEL (CC13)
ALT: 13 U/L (ref 0–55)
AST: 13 U/L (ref 5–34)
Albumin: 3.8 g/dL (ref 3.5–5.0)
Alkaline Phosphatase: 168 U/L — ABNORMAL HIGH (ref 40–150)
BUN: 11.2 mg/dL (ref 7.0–26.0)
CO2: 24 meq/L (ref 22–29)
Calcium: 9.5 mg/dL (ref 8.4–10.4)
Chloride: 106 meq/L (ref 98–107)
Creatinine: 0.8 mg/dL (ref 0.6–1.1)
Glucose: 109 mg/dL — ABNORMAL HIGH (ref 70–99)
Potassium: 4.2 meq/L (ref 3.5–5.1)
Sodium: 139 meq/L (ref 136–145)
Total Bilirubin: 0.87 mg/dL (ref 0.20–1.20)
Total Protein: 7.3 g/dL (ref 6.4–8.3)

## 2012-06-25 LAB — CBC WITH DIFFERENTIAL/PLATELET
BASO%: 0.7 % (ref 0.0–2.0)
Basophils Absolute: 0.1 10e3/uL (ref 0.0–0.1)
EOS%: 4 % (ref 0.0–7.0)
Eosinophils Absolute: 0.4 10e3/uL (ref 0.0–0.5)
HCT: 43.5 % (ref 34.8–46.6)
HGB: 14.3 g/dL (ref 11.6–15.9)
LYMPH%: 22.4 % (ref 14.0–49.7)
MCH: 28.3 pg (ref 25.1–34.0)
MCHC: 32.9 g/dL (ref 31.5–36.0)
MCV: 86 fL (ref 79.5–101.0)
MONO#: 0.6 10e3/uL (ref 0.1–0.9)
MONO%: 6 % (ref 0.0–14.0)
NEUT#: 6.3 10e3/uL (ref 1.5–6.5)
NEUT%: 66.9 % (ref 38.4–76.8)
Platelets: 137 10e3/uL — ABNORMAL LOW (ref 145–400)
RBC: 5.07 10e6/uL (ref 3.70–5.45)
RDW: 17.2 % — ABNORMAL HIGH (ref 11.2–14.5)
WBC: 9.4 10e3/uL (ref 3.9–10.3)
lymph#: 2.1 10e3/uL (ref 0.9–3.3)

## 2012-06-25 LAB — CEA: CEA: 177.6 ng/mL — ABNORMAL HIGH (ref 0.0–5.0)

## 2012-06-25 MED ORDER — OXYCODONE HCL 5 MG PO TABS: 5.0000 mg | ORAL_TABLET | Freq: Four times a day (QID) | ORAL | Status: DC | PRN

## 2012-06-25 NOTE — Progress Notes (Signed)
Hematology and Oncology Follow Up Visit  [name] [id number] 18-Jun-1975 37 y.o. 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  Prior Therapy:  1. S/P laparoscopic assisted sigmoid colectomy on 08/05/10 for a 5 cm well-differentiated adenocarcinoma with 1/20 positive lymph nodes. 2. S/P temporary ileostomy which was reversed on 10/04/10. 3. Received FOLFOX 6 on 11/07/10 and received 2 cycles. She took a month off for personal issues and resumes treatment with a 15% dose reduction due to thrombocytopenia. Completed a total of 7 cycles on 03/07/11. She was thereafter lost to follow-up  Current therapy: FOLFIRI initiated on 02/28/12. She is here for cycle 5. She started Avastin with cycle 2.  Interim History:  [name] is seen for routine follow-up today prior to chemotherapy. She has tolerated chemotherapy well without complications. She continues to have right sided abdominal pain around to her flank. Uses Oxycodone 1-2 times per day. Had nausea and vomiting a few times during the night. Using Zofran and Compazine with relief. Stools alternate between diarrhea and constipation. Denies blood in her stool or urine. Denies chest pain, shortness of breath, dyspnea. Tolerated last cycle of chemo better with dose reduction overall. She is still coughing and just finished a course of Levaquin. She report no fevers or shortness of breath.   Medications: I have reviewed the patient's current medications. Current outpatient prescriptions:albuterol (PROAIR HFA) 108 (90 BASE) MCG/ACT inhaler, Inhale 2 puffs into the lungs every 4 (four) hours as needed. Wheezing and shortness of breath, Disp: , Rfl: ;  dexamethasone (DECADRON) 4 MG tablet, Take 2 tablets (8 mg) daily for 3 days after chemotherapy then stop, Disp: 30 tablet, Rfl: 0 Docusate Sodium  (STOOL SOFTENER) 100 MG capsule, Take 100 mg by mouth 3 (three) times daily as needed. constipation, Disp: , Rfl: ;  esomeprazole (NEXIUM) 40 MG capsule, Take 40 mg by mouth daily before breakfast., Disp: , Rfl: ;  fluconazole (DIFLUCAN) 100 MG tablet, Take 1 tablet (100 mg total) by mouth daily., Disp: 7 tablet, Rfl: 0 guaiFENesin (MUCINEX) 600 MG 12 hr tablet, Take 1 tablet (600 mg total) by mouth 2 (two) times daily., Disp: 20 tablet, Rfl: 0;  levofloxacin (LEVAQUIN) 500 MG tablet, Take 1 tablet (500 mg total) by mouth daily., Disp: 10 tablet, Rfl: 0;  lidocaine-prilocaine (EMLA) cream, Apply topically as needed. To port a cath, Disp: 30 g, Rfl: 0;  LORazepam (ATIVAN) 0.5 MG tablet, 1tab(0.5mg ) po/sl q8hrs prn nausea/vomiting, Disp: 30 tablet, Rfl: 1 ondansetron (ZOFRAN) 8 MG tablet, Take 1 tablet twice a day, begin day after chemotherapy and take for 3 days. Then as needed for nausea, Disp: 30 tablet, Rfl: 2;  oxyCODONE (OXY IR/ROXICODONE) 5 MG immediate release tablet, Take 1-2 tablets (5-10 mg total) by mouth every 6 (six) hours as needed., Disp: 50 tablet, Rfl: 0;  prochlorperazine (COMPAZINE) 25 MG suppository, Place 25 mg rectally as needed. Only with chemo for nausea, Disp: , Rfl:  propranolol (INDERAL) 40 MG tablet, Take 40 mg by mouth as needed. hypertension, Disp: , Rfl: ;  propylthiouracil (PTU) 50 MG tablet, Take 100 mg by mouth 3 (three) times daily. , Disp: , Rfl:  Current facility-administered medications:0.9 %  sodium chloride infusion, 500 mL, Intravenous, Continuous, Mardella Layman, MD  Allergies:  Allergies  Allergen Reactions  . Codeine Hives  . Penicillins Hives  . Sulfa  Antibiotics Hives    Past Medical History, Surgical history, Social history, and Family History were reviewed and updated.  Review of Systems: Constitutional:  Negative for chills, night sweats, anorexia, weight loss, pain. Cardiovascular: no chest pain or dyspnea on exertion Respiratory: no cough,  shortness of breath, or wheezing Neurological: no TIA or stroke symptoms Dermatological: negative ENT: negative Skin: Negative. Gastrointestinal: positive for - abdominal pain negative for - appetite loss or melena Genito-Urinary: no dysuria, trouble voiding, or hematuria Hematological and Lymphatic: negative Breast: negative for breast lumps Musculoskeletal: negative Remaining ROS negative.  Physical Exam: Blood pressure 121/80, pulse 95, temperature 97.9 F (36.6 C), temperature source Oral, resp. rate 20, height 5\' 7"  (1.702 m), weight 158 lb 12.8 oz (72.031 kg), last menstrual period 06/13/2012. ECOG: 1 General appearance: alert, cooperative and no distress Head: Normocephalic, without obvious abnormality, atraumatic Neck: no adenopathy, no carotid bruit, no JVD, supple, symmetrical, trachea midline and thyroid not enlarged, symmetric, no tenderness/mass/nodules Lymph nodes: Cervical, supraclavicular, and axillary nodes normal. Heart:regular rate and rhythm, S1, S2 normal, no murmur, click, rub or gallop Lung:chest with expiratory wheezing. No rales, normal symmetric air entry, no tachypnea, retractions or cyanosis Abdomen: soft, normal bowel sounds and mass located in the lower abdomen EXT:no erythema, induration, or nodules   Lab Results: Lab Results  Component Value Date   WBC 9.4 06/25/2012   HGB 14.3 06/25/2012   HCT 43.5 06/25/2012   MCV 86.0 06/25/2012   PLT 137* 06/25/2012     Chemistry      Component Value Date/Time   NA 141 06/11/2012 1023   NA 132* 03/06/2012 0345   K 4.0 06/11/2012 1023   K 4.0 03/06/2012 0345   CL 106 06/11/2012 1023   CL 95* 03/06/2012 0345   CO2 27 06/11/2012 1023   CO2 23 03/06/2012 0345   BUN 6.2* 06/11/2012 1023   BUN 11 03/06/2012 0345   CREATININE 0.8 06/11/2012 1023   CREATININE 0.66 03/06/2012 0345      Component Value Date/Time   CALCIUM 9.0 06/11/2012 1023   CALCIUM 9.5 03/06/2012 0345   ALKPHOS 169* 06/11/2012 1023   ALKPHOS 228*  03/06/2012 0345   AST 16 06/11/2012 1023   AST 17 03/06/2012 0345   ALT 14 06/11/2012 1023   ALT 22 03/06/2012 0345   BILITOT 0.25 06/11/2012 1023   BILITOT 0.3 03/06/2012 0345     CT CHEST, ABDOMEN AND PELVIS WITH CONTRAST  Technique: Multidetector CT imaging of the chest, abdomen and  pelvis was performed following the standard protocol during bolus  administration of intravenous contrast.  Contrast: OMNIPAQUE IOHEXOL 300 MG/ML SOLN  Comparison: PET CT scan 02/19/2012, CT scan 01/27/2012  CT CHEST  Findings: Port in the left anterior chest wall. No axillary or  supraclavicular lymphadenopathy. No mediastinal or hilar  lymphadenopathy. No pericardial fluid. Esophagus is normal. Small  nodule in the inferior left lobe of thyroid gland measures 6 mm.  Within the right upper lobe 3 mm pulmonary nodule (image 11) is  increased from 1 mm on prior. Smaller 1 mm nodule (image 18) is  unchanged. Right lower lobe nodule posterior to the oblique  fissure measures 4 mm (image 32) is similar to 3 mm on prior.  Several subpleural nodules in the right lower lobe are unchanged.  The 4 mm right lower lobe nodule (image 34 is unchanged. There is  similar scattered nodules in the left lung to a lesser degree which  are unchanged also.  IMPRESSION:  1. No evidence disease progression within thorax.  2. Scattered small sub 5 mm pulmonary nodules are essentially  stable compared to CT of 11/01/2010.  CT ABDOMEN AND PELVIS  Findings: Low density lesion in the dome the right hepatic lobe  measures 17 x 13 mm similar to 14 x 11 mm on prior. No new lesion  evident. This was the lesion was hypermetabolic on comparison PET  CT scan. The gallbladder is absent. The pancreas, spleen, adrenal  glands, and kidneys are normal.  The stomach, small bowel, cecum are normal. There is an enteric  enteric anastomosis in the right abdomen which is unchanged. There  is a surgical clip at the cecum which may represent  appendectomy  site. There is a surgical anastomosis in the distal sigmoid colon  without evidence obstruction nodularity. Along the distal rectum  there is a soft tissue nodule measuring 20 mm by 12 mm (image 108)  which is decreased in measurement from 26 x 14 mm on CT 01/27/2012.  The dominant mass within the pelvis is again multi cystic and solid  and has increased in size. Lesion measures 14 x 12 cm in axial  dimension compared to 14 x 10 centimeters on prior. Lesion  measures 16 mm in craniocaudad dimension compared to 15.3 cm on  prior. The greatest degree of enlargement is in the left superior  aspect of the lesion were 6.7 x 7.5 cm cystic solid nodule is  increased from 4.8 x 3.1 cm on prior.  There is no pelvic lymphadenopathy. Bladder is grossly normal and  compressed by the lesion. Review of bone windows demonstrates no  aggressive osseous lesions.  IMPRESSION:  1. Increase in size of large cystic and solid mass lesion within  the pelvis. The most dramatic increase in size in the superior  left aspect of the lesion.  2. Nodular implant along the distal rectum is slightly  decreased in size.  3. Metastasis in the dome of the right hepatic lobe is similar to  prior.   Impression and Plan:  This is a 37 year old female with the following issues:  1. Stage IV colon cancer. Here for cycle 6 of FOLFIRI/Avastin today. CT scan results discussed with the patient today. It appears outside her pelvic mass, her disease is stable. I would recommend continuing chemotherapy to control her systemic disease but likely will need different option to treat her pelvic mass. I do not think radiatio will help with a large mass like this. I prefer a surgical opinion from Dr. Chauncey Mann an [facility] for consideration for surgery. She is in agreement with that.   2. Abdominal pain. Due to colon ca. I have refilled her Oxycodone today.  3. Nausea/vomiting prophylaxis. She has Zofran and Compazine at  home.  4. Sinusitis/ bronchitis: improving.   5. Follow-up. In 2 weeks with labs and chemo.     Anjelica Gorniak 3/25/201411:02 AM

## 2012-06-25 NOTE — Progress Notes (Signed)
Per infusion room charge nurse, tammi, patient stated she had to cancel her chemotherapy treatment, d/t she was  Called to her daughter's school. Her daughter had a seizure.

## 2012-06-26 ENCOUNTER — Telehealth: Payer: Self-pay | Admitting: *Deleted

## 2012-06-26 NOTE — Telephone Encounter (Signed)
Called dr Karlyn Agee office and was transferred to new patient coordinator's voice mail. LM for her to call me re:  possible surgery options for patient.

## 2012-06-28 ENCOUNTER — Encounter: Payer: Self-pay | Admitting: *Deleted

## 2012-06-28 NOTE — Progress Notes (Unsigned)
Faxed requisition to pathology @ [facility] for slides to be fed-exed overnight, spoke with vanda in radiology, she will put patient's scans on a CD and fed-ex overnight. Chart copied and faxed to susie whorley new patient coordinator for dr Chauncey Mann. Office # [phone]

## 2012-07-01 ENCOUNTER — Telehealth: Payer: Self-pay | Admitting: *Deleted

## 2012-07-01 NOTE — Telephone Encounter (Signed)
Patient's mailbox is full, her friend's phone has been disconnected. Reached a female friend and he said he always texts her, because her mailbox is full. i texted patient from my personal cell phone that she is to call our office,  we have an appt for her at [facility].

## 2012-07-01 NOTE — Telephone Encounter (Signed)
Patient has appt at [facility] April 11th @ 7:30 with dr Chauncey Mann, unable to reach by phone. Have left a text message for her to call this office.

## 2012-07-02 ENCOUNTER — Other Ambulatory Visit: Payer: Self-pay | Admitting: *Deleted

## 2012-07-02 ENCOUNTER — Telehealth: Payer: Self-pay | Admitting: *Deleted

## 2012-07-02 DIAGNOSIS — C189 Malignant neoplasm of colon, unspecified: Secondary | ICD-10-CM

## 2012-07-02 MED ORDER — OXYCODONE HCL 5 MG PO TABS: 5.0000 mg | ORAL_TABLET | Freq: Four times a day (QID) | ORAL | Status: DC | PRN

## 2012-07-02 NOTE — Telephone Encounter (Signed)
Spoke with patient. appt with dr Chauncey Mann is 07-12-12 @ 7:30 am. Her chemo appt for that week was cancelled, per dr Clelia Croft. Michelle notified.

## 2012-07-02 NOTE — Telephone Encounter (Signed)
Per desk RN I have canceled 4/9 appt treatment and 4/11 pump.  JMW

## 2012-07-03 ENCOUNTER — Telehealth: Payer: Self-pay | Admitting: Oncology

## 2012-07-03 NOTE — Telephone Encounter (Signed)
per Dixie pt is getting care from [facility] cancel 4.9 appts

## 2012-07-09 ENCOUNTER — Telehealth: Payer: Self-pay | Admitting: *Deleted

## 2012-07-09 DIAGNOSIS — C189 Malignant neoplasm of colon, unspecified: Secondary | ICD-10-CM

## 2012-07-09 MED ORDER — OXYCODONE HCL 5 MG PO TABS: 5.0000 mg | ORAL_TABLET | Freq: Four times a day (QID) | ORAL | Status: DC | PRN

## 2012-07-09 NOTE — Telephone Encounter (Signed)
Pt called requesting refill on Oxycodone 5mg . Reviewed with MD, Ok to refill Oxycodone 5mg  1-2 tablets PO q 6hrs q# 50 no refills Attempted to notify pt Rx is ready for pick up. Message on vm for phone# listed "Mailbox is full"

## 2012-07-10 ENCOUNTER — Other Ambulatory Visit: Payer: Medicaid Other | Admitting: Lab

## 2012-07-10 ENCOUNTER — Ambulatory Visit: Payer: Medicaid Other | Admitting: Oncology

## 2012-07-15 ENCOUNTER — Other Ambulatory Visit: Payer: Self-pay | Admitting: Oncology

## 2012-07-15 DIAGNOSIS — C189 Malignant neoplasm of colon, unspecified: Secondary | ICD-10-CM

## 2012-07-15 MED ORDER — OXYCODONE HCL 5 MG PO TABS: 5.0000 mg | ORAL_TABLET | Freq: Four times a day (QID) | ORAL | Status: DC | PRN

## 2012-07-24 ENCOUNTER — Other Ambulatory Visit: Payer: Medicaid Other | Admitting: Lab

## 2012-07-24 ENCOUNTER — Telehealth: Payer: Self-pay | Admitting: *Deleted

## 2012-07-24 ENCOUNTER — Ambulatory Visit: Payer: Medicaid Other

## 2012-07-24 ENCOUNTER — Ambulatory Visit: Payer: Medicaid Other | Admitting: Oncology

## 2012-07-24 NOTE — Telephone Encounter (Signed)
Patient calling to say she has been sick and throwing up all night, does not think she can take chemo today, using compazine suppositories. States her daughter has just gotten over this, thinks it's a flu bug. Instructed her to continue using compazine suppositories every 6 hours as needed for vomiting, then start with clear liquids, sport drinks to replenish electrolyte stores. Call back today if not resolved.

## 2012-07-25 ENCOUNTER — Encounter: Payer: Self-pay | Admitting: *Deleted

## 2012-07-25 ENCOUNTER — Other Ambulatory Visit: Payer: Self-pay | Admitting: *Deleted

## 2012-07-25 DIAGNOSIS — C189 Malignant neoplasm of colon, unspecified: Secondary | ICD-10-CM

## 2012-07-25 MED ORDER — OXYCODONE HCL 5 MG PO TABS: 5.0000 mg | ORAL_TABLET | Freq: Four times a day (QID) | ORAL | Status: DC | PRN

## 2012-07-25 MED ORDER — PROCHLORPERAZINE 25 MG RE SUPP: 25.0000 mg | RECTAL | Status: DC | PRN

## 2012-07-25 NOTE — Telephone Encounter (Signed)
Patient calling today to request refills on ativan, compazine and oxycodone, oxycodone script left at front for p/u, others refilled at patient's preferred pharmacy.  patient notified.

## 2012-07-29 ENCOUNTER — Telehealth: Payer: Self-pay | Admitting: Medical Oncology

## 2012-07-29 NOTE — Telephone Encounter (Signed)
erroneous

## 2012-07-30 ENCOUNTER — Other Ambulatory Visit: Payer: Self-pay | Admitting: Oncology

## 2012-07-30 ENCOUNTER — Telehealth: Payer: Self-pay | Admitting: Oncology

## 2012-07-30 ENCOUNTER — Telehealth: Payer: Self-pay | Admitting: *Deleted

## 2012-07-30 DIAGNOSIS — C189 Malignant neoplasm of colon, unspecified: Secondary | ICD-10-CM

## 2012-07-30 MED ORDER — OXYCODONE HCL 5 MG PO TABS: 5.0000 mg | ORAL_TABLET | Freq: Four times a day (QID) | ORAL | Status: DC | PRN

## 2012-07-30 NOTE — Telephone Encounter (Signed)
Per MD ok to refill oxycodone. Pt called notified Rx ready for pick up. F/U appt rescheduled pt to expect call from scheduling with new date/time

## 2012-07-30 NOTE — Telephone Encounter (Signed)
lmonvm for pt re appt for 5/6 and mailed schedule.

## 2012-07-30 NOTE — Telephone Encounter (Signed)
Pt called states " I need a refill on my Oxycodone and need to reschedule my appt that I missed" will review with MD

## 2012-08-06 ENCOUNTER — Ambulatory Visit (HOSPITAL_BASED_OUTPATIENT_CLINIC_OR_DEPARTMENT_OTHER): Payer: Medicaid Other | Admitting: Oncology

## 2012-08-06 ENCOUNTER — Ambulatory Visit (HOSPITAL_BASED_OUTPATIENT_CLINIC_OR_DEPARTMENT_OTHER): Payer: Medicaid Other

## 2012-08-06 ENCOUNTER — Encounter: Payer: Self-pay | Admitting: Oncology

## 2012-08-06 ENCOUNTER — Other Ambulatory Visit (HOSPITAL_BASED_OUTPATIENT_CLINIC_OR_DEPARTMENT_OTHER): Payer: Self-pay

## 2012-08-06 VITALS — BP 131/79 | HR 110 | Temp 98.0°F | Resp 18 | Ht 67.0 in | Wt 148.2 lb

## 2012-08-06 VITALS — BP 114/66 | HR 107

## 2012-08-06 DIAGNOSIS — C7960 Secondary malignant neoplasm of unspecified ovary: Secondary | ICD-10-CM

## 2012-08-06 DIAGNOSIS — C787 Secondary malignant neoplasm of liver and intrahepatic bile duct: Secondary | ICD-10-CM

## 2012-08-06 DIAGNOSIS — C189 Malignant neoplasm of colon, unspecified: Secondary | ICD-10-CM

## 2012-08-06 DIAGNOSIS — Z5112 Encounter for antineoplastic immunotherapy: Secondary | ICD-10-CM

## 2012-08-06 DIAGNOSIS — Z5111 Encounter for antineoplastic chemotherapy: Secondary | ICD-10-CM

## 2012-08-06 DIAGNOSIS — C187 Malignant neoplasm of sigmoid colon: Secondary | ICD-10-CM

## 2012-08-06 DIAGNOSIS — R109 Unspecified abdominal pain: Secondary | ICD-10-CM

## 2012-08-06 LAB — CBC WITH DIFFERENTIAL/PLATELET
Basophils Absolute: 0 10*3/uL (ref 0.0–0.1)
EOS%: 2.9 % (ref 0.0–7.0)
HCT: 41.1 % (ref 34.8–46.6)
MCHC: 32.8 g/dL (ref 31.5–36.0)
MONO%: 5.6 % (ref 0.0–14.0)
NEUT#: 6.7 10*3/uL — ABNORMAL HIGH (ref 1.5–6.5)
Platelets: 164 10*3/uL (ref 145–400)
WBC: 9 10*3/uL (ref 3.9–10.3)
lymph#: 1.5 10*3/uL (ref 0.9–3.3)

## 2012-08-06 LAB — COMPREHENSIVE METABOLIC PANEL (CC13)
Alkaline Phosphatase: 101 U/L (ref 40–150)
Glucose: 139 mg/dl — ABNORMAL HIGH (ref 70–99)
Sodium: 140 mEq/L (ref 136–145)
Total Bilirubin: 1.15 mg/dL (ref 0.20–1.20)
Total Protein: 7.5 g/dL (ref 6.4–8.3)

## 2012-08-06 MED ORDER — SODIUM CHLORIDE 0.9 % IV SOLN
5.0000 mg/kg | Freq: Once | INTRAVENOUS | Status: AC
  Administered 2012-08-06: 350 mg via INTRAVENOUS
  Filled 2012-08-06: qty 14

## 2012-08-06 MED ORDER — POLYETHYLENE GLYCOL 3350 17 GM/SCOOP PO POWD: 17.0000 g | Freq: Every day | ORAL | Status: DC

## 2012-08-06 MED ORDER — LORAZEPAM 0.5 MG PO TABS: ORAL_TABLET | ORAL | Status: DC

## 2012-08-06 MED ORDER — LOPERAMIDE HCL 2 MG PO TABS
4.0000 mg | ORAL_TABLET | Freq: Once | ORAL | Status: AC
  Administered 2012-08-06: 4 mg via ORAL
  Filled 2012-08-06: qty 2

## 2012-08-06 MED ORDER — OXYCODONE HCL 5 MG PO TABS: 5.0000 mg | ORAL_TABLET | Freq: Four times a day (QID) | ORAL | Status: DC | PRN

## 2012-08-06 MED ORDER — SODIUM CHLORIDE 0.9 % IV SOLN
1800.0000 mg/m2 | INTRAVENOUS | Status: DC
  Administered 2012-08-06: 3250 mg via INTRAVENOUS
  Filled 2012-08-06: qty 65

## 2012-08-06 MED ORDER — IRINOTECAN HCL CHEMO INJECTION 100 MG/5ML
135.0000 mg/m2 | Freq: Once | INTRAVENOUS | Status: AC
  Administered 2012-08-06: 244 mg via INTRAVENOUS
  Filled 2012-08-06: qty 12.2

## 2012-08-06 MED ORDER — FLUOROURACIL CHEMO INJECTION 2.5 GM/50ML
300.0000 mg/m2 | Freq: Once | INTRAVENOUS | Status: AC
  Administered 2012-08-06: 550 mg via INTRAVENOUS
  Filled 2012-08-06: qty 11

## 2012-08-06 MED ORDER — SODIUM CHLORIDE 0.9 % IV SOLN
Freq: Once | INTRAVENOUS | Status: AC
  Administered 2012-08-06: 13:00:00 via INTRAVENOUS

## 2012-08-06 MED ORDER — LEUCOVORIN CALCIUM INJECTION 350 MG
300.0000 mg/m2 | Freq: Once | INTRAVENOUS | Status: AC
  Administered 2012-08-06: 540 mg via INTRAVENOUS
  Filled 2012-08-06: qty 27

## 2012-08-06 MED ORDER — DEXAMETHASONE SODIUM PHOSPHATE 20 MG/5ML IJ SOLN
20.0000 mg | Freq: Once | INTRAMUSCULAR | Status: AC
  Administered 2012-08-06: 20 mg via INTRAVENOUS

## 2012-08-06 MED ORDER — ATROPINE SULFATE 1 MG/ML IJ SOLN
0.5000 mg | Freq: Once | INTRAMUSCULAR | Status: AC | PRN
  Administered 2012-08-06: 0.5 mg via INTRAVENOUS

## 2012-08-06 MED ORDER — ONDANSETRON 16 MG/50ML IVPB (CHCC)
16.0000 mg | Freq: Once | INTRAVENOUS | Status: AC
  Administered 2012-08-06: 16 mg via INTRAVENOUS

## 2012-08-06 NOTE — Progress Notes (Signed)
Hematology and Oncology Follow Up Visit  [name] [id number] 12-09-1975 37 y.o. 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  Prior Therapy:  1. S/P laparoscopic assisted sigmoid colectomy on 08/05/10 for a 5 cm well-differentiated adenocarcinoma with 1/20 positive lymph nodes. 2. S/P temporary ileostomy which was reversed on 10/04/10. 3. Received FOLFOX 6 on 11/07/10 and received 2 cycles. She took a month off for personal issues and resumes treatment with a 15% dose reduction due to thrombocytopenia. Completed a total of 7 cycles on 03/07/11. She was thereafter lost to follow-up  Current therapy: FOLFIRI initiated on 02/28/12. She is here for cycle 5. She started Avastin with cycle 2.  Interim History:  [name] is seen for routine follow-up today prior to chemotherapy. She has missed several appointment with [place] over th past month. She has tolerated chemotherapy well without complications. She continues to have right sided abdominal pain around to her flank. Uses Oxycodone 3-4 times per day. No recent nausea or vomiting. Stools alternate between diarrhea and constipation. Denies blood in her stool or urine. Denies chest pain, shortness of breath, dyspnea. Tolerated last cycle of chemo better with dose reduction overall. She report no fevers. She has been referred to [facility] for consideration of surgery, but has not been able to go due to transportation issues. She is working with her Medicaid caseworker to arrange transportation.   Medications: I have reviewed the patient's current medications. Current outpatient prescriptions:albuterol (PROAIR HFA) 108 (90 BASE) MCG/ACT inhaler, Inhale 2 puffs into the lungs every 4 (four) hours as needed. Wheezing and shortness of breath, Disp: , Rfl: ;  dexamethasone (DECADRON) 4 MG tablet, Take 2  tablets (8 mg) daily for 3 days after chemotherapy then stop, Disp: 30 tablet, Rfl: 0 Docusate Sodium (STOOL SOFTENER) 100 MG capsule, Take 100 mg by mouth 3 (three) times daily as needed. constipation, Disp: , Rfl: ;  esomeprazole (NEXIUM) 40 MG capsule, Take 40 mg by mouth daily before breakfast., Disp: , Rfl: ;  lidocaine-prilocaine (EMLA) cream, Apply topically as needed. To port a cath, Disp: 30 g, Rfl: 0;  LORazepam (ATIVAN) 0.5 MG tablet, 1tab(0.5mg ) po/sl q8hrs prn nausea/vomiting, Disp: 30 tablet, Rfl: 1 ondansetron (ZOFRAN) 8 MG tablet, Take 1 tablet twice a day, begin day after chemotherapy and take for 3 days. Then as needed for nausea, Disp: 30 tablet, Rfl: 2;  oxyCODONE (OXY IR/ROXICODONE) 5 MG immediate release tablet, Take 1-2 tablets (5-10 mg total) by mouth every 6 (six) hours as needed., Disp: 50 tablet, Rfl: 0;  polyethylene glycol powder (MIRALAX) powder, Take 17 g by mouth daily., Disp: 255 g, Rfl: 2 prochlorperazine (COMPAZINE) 25 MG suppository, Place 1 suppository (25 mg total) rectally as needed. Only with chemo for nausea, Disp: 12 suppository, Rfl: 1;  propranolol (INDERAL) 40 MG tablet, Take 40 mg by mouth as needed. hypertension, Disp: , Rfl: ;  propylthiouracil (PTU) 50 MG tablet, Take 100 mg by mouth 3 (three) times daily. , Disp: , Rfl:  No current facility-administered medications for this visit. Facility-Administered Medications Ordered in Other Visits: atropine injection 0.5 mg, 0.5 mg, Intravenous, Once PRN, Benjiman Core, MD;  bevacizumab (AVASTIN) 350 mg in sodium chloride 0.9 % 100 mL chemo infusion, 5 mg/kg (Treatment Plan Actual), Intravenous, Once, Benjiman Core, MD;  fluorouracil (ADRUCIL) 3,250 mg in sodium chloride 0.9 %  150 mL chemo infusion, 1,800 mg/m2 (Treatment Plan Actual), Intravenous, 1 day or 1 dose, Benjiman Core, MD fluorouracil (ADRUCIL) chemo injection 550 mg, 300 mg/m2 (Treatment Plan Actual), Intravenous, Once, Benjiman Core, MD;  irinotecan  (CAMPTOSAR) 244 mg in dextrose 5 % 500 mL chemo infusion, 135 mg/m2 (Treatment Plan Actual), Intravenous, Once, Benjiman Core, MD;  leucovorin 540 mg in dextrose 5 % 250 mL infusion, 300 mg/m2 (Treatment Plan Actual), Intravenous, Once, Benjiman Core, MD  Allergies:  Allergies  Allergen Reactions  . Codeine Hives  . Penicillins Hives  . Sulfa Antibiotics Hives    Past Medical History, Surgical history, Social history, and Family History were reviewed and updated.  Review of Systems: Constitutional:  Negative for chills, night sweats, anorexia, weight loss, pain. Cardiovascular: no chest pain or dyspnea on exertion Respiratory: no cough, shortness of breath, or wheezing Neurological: no TIA or stroke symptoms Dermatological: negative ENT: negative Skin: Negative. Gastrointestinal: positive for - abdominal pain negative for - appetite loss or melena Genito-Urinary: no dysuria, trouble voiding, or hematuria Hematological and Lymphatic: negative Breast: negative for breast lumps Musculoskeletal: negative Remaining ROS negative.  Physical Exam: Blood pressure 131/79, pulse 110, temperature 98 F (36.7 C), temperature source Oral, resp. rate 18, height 5\' 7"  (1.702 m), weight 148 lb 3.2 oz (67.223 kg). ECOG: 1 General appearance: alert, cooperative and no distress Head: Normocephalic, without obvious abnormality, atraumatic Neck: no adenopathy, no carotid bruit, no JVD, supple, symmetrical, trachea midline and thyroid not enlarged, symmetric, no tenderness/mass/nodules Lymph nodes: Cervical, supraclavicular, and axillary nodes normal. Heart:regular rate and rhythm, S1, S2 normal, no murmur, click, rub or gallop Lung:chest with expiratory wheezing. No rales, normal symmetric air entry, no tachypnea, retractions or cyanosis Abdomen: soft, normal bowel sounds and mass located in the lower abdomen EXT:no erythema, induration, or nodules   Lab Results: Lab Results  Component Value  Date   WBC 9.0 08/06/2012   HGB 13.5 08/06/2012   HCT 41.1 08/06/2012   MCV 86.2 08/06/2012   PLT 164 08/06/2012     Chemistry      Component Value Date/Time   NA 140 08/06/2012 1051   NA 132* 03/06/2012 0345   K 3.7 08/06/2012 1051   K 4.0 03/06/2012 0345   CL 101 08/06/2012 1051   CL 95* 03/06/2012 0345   CO2 29 08/06/2012 1051   CO2 23 03/06/2012 0345   BUN 8.3 08/06/2012 1051   BUN 11 03/06/2012 0345   CREATININE 0.8 08/06/2012 1051   CREATININE 0.66 03/06/2012 0345      Component Value Date/Time   CALCIUM 9.8 08/06/2012 1051   CALCIUM 9.5 03/06/2012 0345   ALKPHOS 101 08/06/2012 1051   ALKPHOS 228* 03/06/2012 0345   AST 15 08/06/2012 1051   AST 17 03/06/2012 0345   ALT 10 08/06/2012 1051   ALT 22 03/06/2012 0345   BILITOT 1.15 08/06/2012 1051   BILITOT 0.3 03/06/2012 0345      Impression and Plan:  This is a 37 year old female with the following issues:  1. Stage IV colon cancer. Here for cycle 5 of FOLFIRI/Avastin today. Recommend that she proceed with cycle 5 today. Awaiting a surgical opinion from Dr. Chauncey Mann an [facility] for consideration for surgery. She plans to reschedule her appointment at [facility] once she has been able to arrange transportation.    2. Abdominal pain. Due to colon ca. I have refilled her Oxycodone today.  3. Nausea/vomiting prophylaxis. She has Zofran, Compazine, and Ativan  at home.  4. Follow-up. In 2 weeks with labs and chemo.     [place], [place] 5/6/20141:34 PM

## 2012-08-06 NOTE — Progress Notes (Signed)
Called DSS and patient has no insurance Medicaid ended. She needs to reapply.

## 2012-08-06 NOTE — Progress Notes (Signed)
Pre and post Avastin vital signs stable. 

## 2012-08-06 NOTE — Patient Instructions (Signed)
Patient aware of next appointment; discharged home with no complaints. 

## 2012-08-08 ENCOUNTER — Ambulatory Visit (HOSPITAL_BASED_OUTPATIENT_CLINIC_OR_DEPARTMENT_OTHER): Payer: Self-pay

## 2012-08-08 VITALS — BP 104/73 | HR 94 | Temp 97.0°F

## 2012-08-08 DIAGNOSIS — Z5189 Encounter for other specified aftercare: Secondary | ICD-10-CM

## 2012-08-08 DIAGNOSIS — C187 Malignant neoplasm of sigmoid colon: Secondary | ICD-10-CM

## 2012-08-08 DIAGNOSIS — C7960 Secondary malignant neoplasm of unspecified ovary: Secondary | ICD-10-CM

## 2012-08-08 DIAGNOSIS — C189 Malignant neoplasm of colon, unspecified: Secondary | ICD-10-CM

## 2012-08-08 DIAGNOSIS — C787 Secondary malignant neoplasm of liver and intrahepatic bile duct: Secondary | ICD-10-CM

## 2012-08-08 MED ORDER — PEGFILGRASTIM INJECTION 6 MG/0.6ML
6.0000 mg | Freq: Once | SUBCUTANEOUS | Status: AC
  Administered 2012-08-08: 6 mg via SUBCUTANEOUS
  Filled 2012-08-08: qty 0.6

## 2012-08-08 MED ORDER — HEPARIN SOD (PORK) LOCK FLUSH 100 UNIT/ML IV SOLN
500.0000 [IU] | Freq: Once | INTRAVENOUS | Status: AC | PRN
  Administered 2012-08-08: 500 [IU]
  Filled 2012-08-08: qty 5

## 2012-08-08 MED ORDER — SODIUM CHLORIDE 0.9 % IJ SOLN
10.0000 mL | INTRAMUSCULAR | Status: DC | PRN
  Administered 2012-08-08: 10 mL
  Filled 2012-08-08: qty 10

## 2012-08-08 NOTE — Patient Instructions (Signed)
Call MD for problems or concerns 

## 2012-08-12 ENCOUNTER — Encounter: Payer: Self-pay | Admitting: Oncology

## 2012-08-12 ENCOUNTER — Other Ambulatory Visit: Payer: Self-pay | Admitting: *Deleted

## 2012-08-12 ENCOUNTER — Other Ambulatory Visit: Payer: Self-pay | Admitting: Oncology

## 2012-08-12 DIAGNOSIS — C189 Malignant neoplasm of colon, unspecified: Secondary | ICD-10-CM

## 2012-08-12 MED ORDER — OXYCODONE HCL 5 MG PO TABS: 5.0000 mg | ORAL_TABLET | Freq: Four times a day (QID) | ORAL | Status: DC | PRN

## 2012-08-12 NOTE — Progress Notes (Signed)
Called and left the patient a message to call me back. She needs to make sure she has reapplied for medicaid. See previous notes nothing on file for her.

## 2012-08-14 ENCOUNTER — Other Ambulatory Visit: Payer: Self-pay | Admitting: *Deleted

## 2012-08-14 ENCOUNTER — Encounter: Payer: Self-pay | Admitting: Oncology

## 2012-08-14 MED ORDER — DIPHENOXYLATE-ATROPINE 2.5-0.025 MG PO TABS: 2.0000 | ORAL_TABLET | Freq: Four times a day (QID) | ORAL | Status: AC | PRN

## 2012-08-14 NOTE — Progress Notes (Signed)
Called pt and lmovm concerning drug replacement for avastin and neulasta; asked patient to return my call or Raquel's for further assistance.

## 2012-08-14 NOTE — Telephone Encounter (Signed)
Patient callilnig to c/o diarrhea x 1 week. Per kristin curcio np, lomotil  E-scribed to patient's pharmacy

## 2012-08-19 ENCOUNTER — Telehealth: Payer: Self-pay | Admitting: *Deleted

## 2012-08-19 ENCOUNTER — Other Ambulatory Visit: Payer: Self-pay | Admitting: *Deleted

## 2012-08-19 DIAGNOSIS — C189 Malignant neoplasm of colon, unspecified: Secondary | ICD-10-CM

## 2012-08-19 MED ORDER — OXYCODONE HCL 5 MG PO TABS: 5.0000 mg | ORAL_TABLET | Freq: Four times a day (QID) | ORAL | Status: DC | PRN

## 2012-08-19 NOTE — Telephone Encounter (Signed)
Patient calling for a refill on oxycodone, ok per kristin curcio, script left at front and patient aware.

## 2012-08-20 ENCOUNTER — Ambulatory Visit: Payer: Medicaid Other | Admitting: Oncology

## 2012-08-20 ENCOUNTER — Other Ambulatory Visit: Payer: Medicaid Other | Admitting: Lab

## 2012-08-20 ENCOUNTER — Ambulatory Visit: Payer: Medicaid Other

## 2012-08-27 ENCOUNTER — Other Ambulatory Visit: Payer: Self-pay | Admitting: Oncology

## 2012-08-27 DIAGNOSIS — C189 Malignant neoplasm of colon, unspecified: Secondary | ICD-10-CM

## 2012-08-27 MED ORDER — OXYCODONE HCL 5 MG PO TABS: 5.0000 mg | ORAL_TABLET | Freq: Four times a day (QID) | ORAL | Status: DC | PRN

## 2012-09-03 ENCOUNTER — Other Ambulatory Visit (HOSPITAL_BASED_OUTPATIENT_CLINIC_OR_DEPARTMENT_OTHER): Payer: Medicaid Other | Admitting: Lab

## 2012-09-03 ENCOUNTER — Ambulatory Visit: Payer: Medicaid Other

## 2012-09-03 ENCOUNTER — Telehealth: Payer: Self-pay | Admitting: Oncology

## 2012-09-03 ENCOUNTER — Telehealth: Payer: Self-pay | Admitting: *Deleted

## 2012-09-03 ENCOUNTER — Ambulatory Visit (HOSPITAL_BASED_OUTPATIENT_CLINIC_OR_DEPARTMENT_OTHER): Payer: Medicaid Other | Admitting: Oncology

## 2012-09-03 VITALS — BP 119/74 | HR 90 | Temp 97.5°F | Resp 18 | Ht 67.0 in | Wt 147.4 lb

## 2012-09-03 DIAGNOSIS — R109 Unspecified abdominal pain: Secondary | ICD-10-CM

## 2012-09-03 DIAGNOSIS — C187 Malignant neoplasm of sigmoid colon: Secondary | ICD-10-CM

## 2012-09-03 DIAGNOSIS — C787 Secondary malignant neoplasm of liver and intrahepatic bile duct: Secondary | ICD-10-CM

## 2012-09-03 DIAGNOSIS — C189 Malignant neoplasm of colon, unspecified: Secondary | ICD-10-CM

## 2012-09-03 DIAGNOSIS — C7960 Secondary malignant neoplasm of unspecified ovary: Secondary | ICD-10-CM

## 2012-09-03 LAB — CBC WITH DIFFERENTIAL/PLATELET
BASO%: 0.4 % (ref 0.0–2.0)
LYMPH%: 23.9 % (ref 14.0–49.7)
MCHC: 33.1 g/dL (ref 31.5–36.0)
MONO#: 0.4 10*3/uL (ref 0.1–0.9)
RBC: 4.48 10*6/uL (ref 3.70–5.45)
RDW: 13.9 % (ref 11.2–14.5)
WBC: 7.9 10*3/uL (ref 3.9–10.3)
lymph#: 1.9 10*3/uL (ref 0.9–3.3)

## 2012-09-03 LAB — COMPREHENSIVE METABOLIC PANEL (CC13)
ALT: 10 U/L (ref 0–55)
CO2: 28 mEq/L (ref 22–29)
Potassium: 3.9 mEq/L (ref 3.5–5.1)
Sodium: 142 mEq/L (ref 136–145)
Total Bilirubin: 0.57 mg/dL (ref 0.20–1.20)
Total Protein: 6.6 g/dL (ref 6.4–8.3)

## 2012-09-03 MED ORDER — OXYCODONE HCL 5 MG PO TABS: 5.0000 mg | ORAL_TABLET | Freq: Four times a day (QID) | ORAL | Status: DC | PRN

## 2012-09-03 NOTE — Progress Notes (Signed)
Hematology and Oncology Follow Up Visit  [name] [id number] 01/14/1976 37 y.o. 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  Prior Therapy:  1. S/P laparoscopic assisted sigmoid colectomy on 08/05/10 for a 5 cm well-differentiated adenocarcinoma with 1/20 positive lymph nodes. 2. S/P temporary ileostomy which was reversed on 10/04/10. 3. Received FOLFOX 6 on 11/07/10 and received 2 cycles. She took a month off for personal issues and resumes treatment with a 15% dose reduction due to thrombocytopenia. Completed a total of 7 cycles on 03/07/11. She was thereafter lost to follow-up  Current therapy: FOLFIRI initiated on 02/28/12. She is here for cycle 5. She started Avastin with cycle 2.  Interim History:  [name] is seen for routine follow-up today prior to chemotherapy. She has missed several appointment with  Over the last few months. She continues to have right sided abdominal pain around to her flank. Uses Oxycodone 3-4 times per day. No recent nausea or vomiting. Stools alternate between diarrhea and constipation. Denies blood in her stool or urine. Denies chest pain, shortness of breath, dyspnea. She has been referred to [facility] for consideration of surgery, but has not been able to go due to transportation issues. She is set to go there this Friday per her report.   Medications: I have reviewed the patient's current medications.  Current Outpatient Prescriptions  Medication Sig Dispense Refill  . albuterol (PROAIR HFA) 108 (90 BASE) MCG/ACT inhaler Inhale 2 puffs into the lungs every 4 (four) hours as needed. Wheezing and shortness of breath      . dexamethasone (DECADRON) 4 MG tablet Take 2 tablets (8 mg) daily for 3 days after chemotherapy then stop  30 tablet  0  . diphenoxylate-atropine (LOMOTIL) 2.5-0.025 MG per tablet  Take 2 tablets by mouth 4 (four) times daily as needed for diarrhea or loose stools.  60 tablet  1  . Docusate Sodium (STOOL SOFTENER) 100 MG capsule Take 100 mg by mouth 3 (three) times daily as needed. constipation      . esomeprazole (NEXIUM) 40 MG capsule Take 40 mg by mouth daily before breakfast.      . lidocaine-prilocaine (EMLA) cream Apply topically as needed. To port a cath  30 g  0  . LORazepam (ATIVAN) 0.5 MG tablet 1tab(0.5mg ) po/sl q8hrs prn nausea/vomiting  30 tablet  1  . ondansetron (ZOFRAN) 8 MG tablet Take 1 tablet twice a day, begin day after chemotherapy and take for 3 days. Then as needed for nausea  30 tablet  2  . oxyCODONE (OXY IR/ROXICODONE) 5 MG immediate release tablet Take 1-2 tablets (5-10 mg total) by mouth every 6 (six) hours as needed.  60 tablet  0  . polyethylene glycol powder (MIRALAX) powder Take 17 g by mouth daily.  255 g  2  . prochlorperazine (COMPAZINE) 25 MG suppository Place 1 suppository (25 mg total) rectally as needed. Only with chemo for nausea  12 suppository  1  . propranolol (INDERAL) 40 MG tablet Take 40 mg by mouth as needed. hypertension      . propylthiouracil (PTU) 50 MG tablet Take 100 mg by mouth 3 (three) times daily.        No current facility-administered medications for this visit.    Allergies:  Allergies  Allergen Reactions  . Codeine Hives  . Penicillins Hives  .  Sulfa Antibiotics Hives    Past Medical History, Surgical history, Social history, and Family History were reviewed and updated.  Review of Systems: Constitutional:  Negative for chills, night sweats, anorexia, weight loss, pain. Cardiovascular: no chest pain or dyspnea on exertion Respiratory: no cough, shortness of breath, or wheezing Neurological: no TIA or stroke symptoms Dermatological: negative ENT: negative Skin: Negative. Gastrointestinal: positive for - abdominal pain negative for - appetite loss or melena Genito-Urinary: no dysuria, trouble voiding,  or hematuria Hematological and Lymphatic: negative Breast: negative for breast lumps Musculoskeletal: negative Remaining ROS negative.  Physical Exam: Blood pressure 119/74, pulse 90, temperature 97.5 F (36.4 C), temperature source Oral, resp. rate 18, height 5\' 7"  (1.702 m), weight 147 lb 6.4 oz (66.86 kg). ECOG: 1 General appearance: alert, cooperative and no distress Head: Normocephalic, without obvious abnormality, atraumatic Neck: no adenopathy, no carotid bruit, no JVD, supple, symmetrical, trachea midline and thyroid not enlarged, symmetric, no tenderness/mass/nodules Lymph nodes: Cervical, supraclavicular, and axillary nodes normal. Heart:regular rate and rhythm, S1, S2 normal, no murmur, click, rub or gallop Lung:chest with expiratory wheezing. No rales, normal symmetric air entry, no tachypnea, retractions or cyanosis Abdomen: soft, normal bowel sounds and mass located in the lower abdomen EXT:no erythema, induration, or nodules   Lab Results: Lab Results  Component Value Date   WBC 7.9 09/03/2012   HGB 12.5 09/03/2012   HCT 37.6 09/03/2012   MCV 84.1 09/03/2012   PLT 195 09/03/2012     Chemistry      Component Value Date/Time   NA 140 08/06/2012 1051   NA 132* 03/06/2012 0345   K 3.7 08/06/2012 1051   K 4.0 03/06/2012 0345   CL 101 08/06/2012 1051   CL 95* 03/06/2012 0345   CO2 29 08/06/2012 1051   CO2 23 03/06/2012 0345   BUN 8.3 08/06/2012 1051   BUN 11 03/06/2012 0345   CREATININE 0.8 08/06/2012 1051   CREATININE 0.66 03/06/2012 0345      Component Value Date/Time   CALCIUM 9.8 08/06/2012 1051   CALCIUM 9.5 03/06/2012 0345   ALKPHOS 101 08/06/2012 1051   ALKPHOS 228* 03/06/2012 0345   AST 15 08/06/2012 1051   AST 17 03/06/2012 0345   ALT 10 08/06/2012 1051   ALT 22 03/06/2012 0345   BILITOT 1.15 08/06/2012 1051   BILITOT 0.3 03/06/2012 0345      Impression and Plan:  This is a 37 year old female with the following issues:  1. Stage IV colon cancer. Here for cycle 5 of  FOLFIRI/Avastin today. Recommend that we hold chemotherapy for now till she gets a surgical opinion from Dr. Chauncey Mann an [facility] for consideration for surgery. If she is to have surgery, then chemotherapy will continue to be on hold. If not, We will restart her in the next few weeks.   2. Abdominal pain. Due to colon ca. I have refilled her Oxycodone today.  3. Nausea/vomiting prophylaxis. She has Zofran, Compazine, and Ativan at home.  4. Follow-up. In 3 weeks with labs and chemo.     Istvan Behar 6/3/201410:16 AM

## 2012-09-03 NOTE — Telephone Encounter (Signed)
Per staff message and POF I have scheduled appts.  JMW  

## 2012-09-03 NOTE — Telephone Encounter (Signed)
Gave pt appt for lab and MD and chemo on June 2014

## 2012-09-04 LAB — CEA: CEA: 133.8 ng/mL — ABNORMAL HIGH (ref 0.0–5.0)

## 2012-09-06 ENCOUNTER — Other Ambulatory Visit: Payer: Self-pay | Admitting: Oncology

## 2012-09-10 ENCOUNTER — Other Ambulatory Visit: Payer: Self-pay | Admitting: *Deleted

## 2012-09-10 DIAGNOSIS — C189 Malignant neoplasm of colon, unspecified: Secondary | ICD-10-CM

## 2012-09-10 MED ORDER — OXYCODONE HCL 5 MG PO TABS: 5.0000 mg | ORAL_TABLET | Freq: Four times a day (QID) | ORAL | Status: DC | PRN

## 2012-09-10 NOTE — Telephone Encounter (Signed)
Patient called to request a refill on her oxycodone. Signed script left at the front for patient p/u.

## 2012-09-13 ENCOUNTER — Other Ambulatory Visit: Payer: Self-pay | Admitting: *Deleted

## 2012-09-16 ENCOUNTER — Other Ambulatory Visit: Payer: Self-pay | Admitting: *Deleted

## 2012-09-16 DIAGNOSIS — C189 Malignant neoplasm of colon, unspecified: Secondary | ICD-10-CM

## 2012-09-16 MED ORDER — OXYCODONE HCL 5 MG PO TABS: 5.0000 mg | ORAL_TABLET | Freq: Four times a day (QID) | ORAL | Status: DC | PRN

## 2012-09-16 NOTE — Telephone Encounter (Signed)
Patient calling for refill script for oxycodone. Signed script left at front for patient p/u. Patient notified.

## 2012-09-23 ENCOUNTER — Other Ambulatory Visit: Payer: Self-pay | Admitting: Oncology

## 2012-09-23 DIAGNOSIS — C189 Malignant neoplasm of colon, unspecified: Secondary | ICD-10-CM

## 2012-09-23 MED ORDER — OXYCODONE HCL 5 MG PO TABS: 5.0000 mg | ORAL_TABLET | Freq: Four times a day (QID) | ORAL | Status: DC | PRN

## 2012-09-23 NOTE — Progress Notes (Signed)
Refilled Oxycodone 5mg  tablet; reminded patient of appointment for tomorrow (09/24/12)  @ 11:15; patient states that she will be at appointment tomorrow.

## 2012-09-24 ENCOUNTER — Ambulatory Visit: Payer: Medicaid Other

## 2012-09-24 ENCOUNTER — Other Ambulatory Visit: Payer: Medicaid Other | Admitting: Lab

## 2012-09-24 ENCOUNTER — Ambulatory Visit: Payer: Medicaid Other | Admitting: Oncology

## 2012-09-30 ENCOUNTER — Other Ambulatory Visit: Payer: Self-pay

## 2012-09-30 DIAGNOSIS — C189 Malignant neoplasm of colon, unspecified: Secondary | ICD-10-CM

## 2012-09-30 MED ORDER — OXYCODONE HCL 5 MG PO TABS: 5.0000 mg | ORAL_TABLET | Freq: Four times a day (QID) | ORAL | Status: DC | PRN

## 2012-10-06 ENCOUNTER — Emergency Department (HOSPITAL_COMMUNITY)
Admission: EM | Admit: 2012-10-06 | Discharge: 2012-10-06 | Disposition: A | Discharge status: Home / Self Care | Payer: Self-pay | Attending: Emergency Medicine | Admitting: Emergency Medicine

## 2012-10-06 ENCOUNTER — Encounter (HOSPITAL_COMMUNITY): Payer: Self-pay

## 2012-10-06 DIAGNOSIS — G8929 Other chronic pain: Secondary | ICD-10-CM | POA: Insufficient documentation

## 2012-10-06 DIAGNOSIS — C189 Malignant neoplasm of colon, unspecified: Secondary | ICD-10-CM | POA: Insufficient documentation

## 2012-10-06 DIAGNOSIS — E0590 Thyrotoxicosis, unspecified without thyrotoxic crisis or storm: Secondary | ICD-10-CM | POA: Insufficient documentation

## 2012-10-06 DIAGNOSIS — Z79899 Other long term (current) drug therapy: Secondary | ICD-10-CM | POA: Insufficient documentation

## 2012-10-06 DIAGNOSIS — F329 Major depressive disorder, single episode, unspecified: Secondary | ICD-10-CM | POA: Insufficient documentation

## 2012-10-06 DIAGNOSIS — Z8742 Personal history of other diseases of the female genital tract: Secondary | ICD-10-CM | POA: Insufficient documentation

## 2012-10-06 DIAGNOSIS — Z87440 Personal history of urinary (tract) infections: Secondary | ICD-10-CM | POA: Insufficient documentation

## 2012-10-06 DIAGNOSIS — M545 Low back pain, unspecified: Secondary | ICD-10-CM | POA: Insufficient documentation

## 2012-10-06 DIAGNOSIS — Z8679 Personal history of other diseases of the circulatory system: Secondary | ICD-10-CM | POA: Insufficient documentation

## 2012-10-06 DIAGNOSIS — Z8719 Personal history of other diseases of the digestive system: Secondary | ICD-10-CM | POA: Insufficient documentation

## 2012-10-06 DIAGNOSIS — F3289 Other specified depressive episodes: Secondary | ICD-10-CM | POA: Insufficient documentation

## 2012-10-06 DIAGNOSIS — R000 Tachycardia, unspecified: Secondary | ICD-10-CM | POA: Insufficient documentation

## 2012-10-06 DIAGNOSIS — I10 Essential (primary) hypertension: Secondary | ICD-10-CM | POA: Insufficient documentation

## 2012-10-06 DIAGNOSIS — K219 Gastro-esophageal reflux disease without esophagitis: Secondary | ICD-10-CM | POA: Insufficient documentation

## 2012-10-06 DIAGNOSIS — J45909 Unspecified asthma, uncomplicated: Secondary | ICD-10-CM | POA: Insufficient documentation

## 2012-10-06 DIAGNOSIS — Z880 Allergy status to penicillin: Secondary | ICD-10-CM | POA: Insufficient documentation

## 2012-10-06 DIAGNOSIS — R109 Unspecified abdominal pain: Secondary | ICD-10-CM | POA: Insufficient documentation

## 2012-10-06 DIAGNOSIS — Z8669 Personal history of other diseases of the nervous system and sense organs: Secondary | ICD-10-CM | POA: Insufficient documentation

## 2012-10-06 DIAGNOSIS — F411 Generalized anxiety disorder: Secondary | ICD-10-CM | POA: Insufficient documentation

## 2012-10-06 DIAGNOSIS — F17200 Nicotine dependence, unspecified, uncomplicated: Secondary | ICD-10-CM | POA: Insufficient documentation

## 2012-10-06 DIAGNOSIS — Z9889 Other specified postprocedural states: Secondary | ICD-10-CM | POA: Insufficient documentation

## 2012-10-06 LAB — URINALYSIS, ROUTINE W REFLEX MICROSCOPIC
Glucose, UA: NEGATIVE mg/dL
Hgb urine dipstick: NEGATIVE
Specific Gravity, Urine: 1.042 — ABNORMAL HIGH (ref 1.005–1.030)
pH: 5.5 (ref 5.0–8.0)

## 2012-10-06 LAB — CBC WITH DIFFERENTIAL/PLATELET
Basophils Absolute: 0 10*3/uL (ref 0.0–0.1)
Basophils Relative: 0 % (ref 0–1)
Eosinophils Absolute: 0.2 10*3/uL (ref 0.0–0.7)
Eosinophils Relative: 2 % (ref 0–5)
Lymphocytes Relative: 19 % (ref 12–46)
MCV: 82.2 fL (ref 78.0–100.0)
Platelets: 222 10*3/uL (ref 150–400)
RDW: 13.2 % (ref 11.5–15.5)
WBC: 10.7 10*3/uL — ABNORMAL HIGH (ref 4.0–10.5)

## 2012-10-06 LAB — COMPREHENSIVE METABOLIC PANEL
ALT: 15 U/L (ref 0–35)
AST: 21 U/L (ref 0–37)
Albumin: 3.3 g/dL — ABNORMAL LOW (ref 3.5–5.2)
CO2: 30 mEq/L (ref 19–32)
Calcium: 9.6 mg/dL (ref 8.4–10.5)
Sodium: 139 mEq/L (ref 135–145)
Total Protein: 7.4 g/dL (ref 6.0–8.3)

## 2012-10-06 LAB — URINE MICROSCOPIC-ADD ON

## 2012-10-06 MED ORDER — SODIUM CHLORIDE 0.9 % IV BOLUS (SEPSIS)
1000.0000 mL | Freq: Once | INTRAVENOUS | Status: AC
  Administered 2012-10-06: 1000 mL via INTRAVENOUS

## 2012-10-06 MED ORDER — SODIUM CHLORIDE 0.9 % IV SOLN
INTRAVENOUS | Status: DC
  Administered 2012-10-06: 20:00:00 via INTRAVENOUS

## 2012-10-06 MED ORDER — HYDROMORPHONE HCL PF 2 MG/ML IJ SOLN
2.0000 mg | Freq: Once | INTRAMUSCULAR | Status: AC
  Administered 2012-10-06: 2 mg via INTRAVENOUS
  Filled 2012-10-06: qty 1

## 2012-10-06 MED ORDER — HYDROMORPHONE HCL PF 1 MG/ML IJ SOLN
1.0000 mg | Freq: Once | INTRAMUSCULAR | Status: AC
  Administered 2012-10-06: 1 mg via INTRAVENOUS
  Filled 2012-10-06: qty 1

## 2012-10-06 MED ORDER — ONDANSETRON HCL 4 MG/2ML IJ SOLN
4.0000 mg | Freq: Once | INTRAMUSCULAR | Status: AC
  Administered 2012-10-06: 4 mg via INTRAVENOUS
  Filled 2012-10-06: qty 2

## 2012-10-06 NOTE — ED Provider Notes (Signed)
Medical screening examination/treatment/procedure(s) were performed by non-physician practitioner and as supervising physician I was immediately available for consultation/collaboration.  Quaron Delacruz T Wynelle Dreier, MD 10/06/12 2028 

## 2012-10-06 NOTE — ED Provider Notes (Signed)
History    CSN: [id number] Arrival date & time 10/06/12  1643  First MD Initiated Contact with Patient 10/06/12 1719     Chief Complaint  Patient presents with  . Abdominal Pain  . Back Pain   (Consider location/radiation/quality/duration/timing/severity/associated sxs/prior Treatment) HPI Comments: Patient with history of colon cancer presents with exacerbation of her typical abdominal pain. Patient states that she's been taking oxycodone 5 mg tablets but the pain has continued to worsen for the past 2-3 days. She denies fevers, vomiting, change in her stools, blood in stool. She denies chest pain or shortness of breath. Patient states that she was going to follow up with her oncologist tomorrow but cannot take the pain any longer. The onset of this condition was gradual. The course is constant. Aggravating factors: none. Alleviating factors: none.    The history is provided by the patient.   Past Medical History  Diagnosis Date  . Asthma   . Gallstones   . Diverticulosis of colon (without mention of hemorrhage)   . Esophageal reflux   . Urinary tract infection   . Depression   . Anxiety   . Chronic headaches   . 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 Allergy   . Hyperthyroidism     takes Propanolol  . Seizures     last one at age 68  . colon ca dx'd 07/26/10    surg; chemo to begin  . Grave's disease   . Hypertension   . Asthma   . Colon cancer    Past Surgical History  Procedure Laterality Date  . Dilation and curettage of uterus    . Johnsten drain    . Orif acetabular fracture    . Metal plate  1478    Right hand  . Colon surgery    . Portacath placement    . Hand surgery      Right hand surgery   Family History  Problem Relation Age of Onset  . COPD Father   . Diabetes Father   . Colon cancer Father   . Colon polyps Father   . Kidney disease Mother   . Cervical cancer Mother   . Breast cancer Maternal Grandmother   . Lung cancer Brother    . Asthma Sister   . Colon cancer Paternal Grandfather    History  Substance Use Topics  . Smoking status: Current Every Day Smoker -- 1.00 packs/day for 20 years    Types: Cigarettes  . Smokeless tobacco: Never Used  . Alcohol Use: No   OB History   Grav Para Term Preterm Abortions TAB SAB Ect Mult Living   15 5 3 2 10 1 9  1 3      Review of Systems  Constitutional: Negative for fever.  HENT: Negative for sore throat and rhinorrhea.   Eyes: Negative for redness.  Respiratory: Negative for cough.   Cardiovascular: Negative for chest pain and leg swelling.  Gastrointestinal: Positive for abdominal pain. Negative for nausea, vomiting, diarrhea and blood in stool.  Genitourinary: Negative for dysuria.  Musculoskeletal: Negative for myalgias.  Skin: Negative for rash.  Neurological: Negative for headaches.    Allergies  Codeine; Penicillins; and Sulfa antibiotics  Home Medications   Current Outpatient Rx  Name  Route  Sig  Dispense  Refill  . albuterol (PROAIR HFA) 108 (90 BASE) MCG/ACT inhaler   Inhalation   Inhale 2 puffs into the lungs every 4 (four) hours  as needed for wheezing or shortness of breath.          . dexamethasone (DECADRON) 4 MG tablet   Oral   Take 8 mg by mouth as directed. Taken on first 3 days post-chemo.         . diphenoxylate-atropine (LOMOTIL) 2.5-0.025 MG per tablet   Oral   Take 2 tablets by mouth 4 (four) times daily as needed for diarrhea or loose stools.   60 tablet   1   . Docusate Sodium (STOOL SOFTENER) 100 MG capsule   Oral   Take 100-200 mg by mouth 2 (two) times daily as needed for constipation.          Marland Kitchen esomeprazole (NEXIUM) 40 MG capsule   Oral   Take 40 mg by mouth daily before breakfast.         . lidocaine-prilocaine (EMLA) cream   Topical   Apply 1 application topically as needed (for port-a-cath access).         . LORazepam (ATIVAN) 0.5 MG tablet   Oral   Take 0.5 mg by mouth every 8 (eight) hours as  needed (for nausea).         . ondansetron (ZOFRAN) 8 MG tablet   Oral   Take 8 mg by mouth every 12 (twelve) hours as needed for nausea.         Marland Kitchen oxyCODONE (OXY IR/ROXICODONE) 5 MG immediate release tablet   Oral   Take 5-10 mg by mouth every 6 (six) hours as needed for pain.         . polyethylene glycol (MIRALAX / GLYCOLAX) packet   Oral   Take 17 g by mouth daily as needed (for constipation).         . propranolol (INDERAL) 40 MG tablet   Oral   Take 40 mg by mouth daily as needed (for hypertension).          . propylthiouracil (PTU) 50 MG tablet   Oral   Take 100 mg by mouth 3 (three) times daily.           BP 113/70  Pulse 126  Temp(Src) 98.9 F (37.2 C) (Oral)  Resp 18  Ht 5' 7.5" (1.715 m)  Wt 128 lb (58.06 kg)  BMI 19.74 kg/m2  SpO2 100% Physical Exam  Nursing note and vitals reviewed. Constitutional: She appears well-developed and well-nourished.  HENT:  Head: Normocephalic and atraumatic.  Eyes: Conjunctivae are normal. Right eye exhibits no discharge. Left eye exhibits no discharge.  Neck: Normal range of motion. Neck supple.  Cardiovascular: Regular rhythm and normal heart sounds.  Tachycardia present.   Pulmonary/Chest: Effort normal and breath sounds normal. No respiratory distress.  Abdominal: Soft. Bowel sounds are normal. She exhibits no distension. There is tenderness. There is no rebound and no guarding.  Musculoskeletal: She exhibits no edema.  No LE edema  Neurological: She is alert.  Skin: Skin is warm and dry.  Psychiatric: She has a normal mood and affect.    ED Course  Procedures (including critical care time) Labs Reviewed  CBC WITH DIFFERENTIAL - Abnormal; Notable for the following:    WBC 10.7 (*)    All other components within normal limits  COMPREHENSIVE METABOLIC PANEL - Abnormal; Notable for the following:    Glucose, Bld 106 (*)    Albumin 3.3 (*)    All other components within normal limits  LIPASE, BLOOD   URINALYSIS, ROUTINE W REFLEX MICROSCOPIC   No  results found.  1. Abdominal pain   2. Colon cancer     7:27 PM Patient seen and examined. Work-up initiated. Medications ordered.   Vital signs reviewed and are as follows: Filed Vitals:   10/06/12 1657  BP: 113/70  Pulse: 126  Temp: 98.9 F (37.2 C)  Resp: 18   8:17 PM Patient treated with pain medications and fluids. Work-up unremarkable. Do not feel CT is indicated at this time.   Pain is not gone, but improved per patient. Another 1mg  dilaudid ordered.   Patient d/w and seen by Dr. Freida Busman.   Patient persistently tachycardic. Discussed this with Dr. Freida Busman and cleared for d/c.    MDM  Abdominal pain -- this is worsening of patient's chronic pain. There are no new features per patient history. Pt is afebrile. Pain improved in ED. No not suspect intraabdominal catastrophe.   Tachycardia -- given fluids. No CP or SOB. Do not suspect PE given patient complaint and presentation.   Do not suspect acute life threatening etiology of symptoms. No indications for admission at this time. Patient clinically/subjectively improved in the ED with treatment. She has home PO pain medications and oncology/PCP f/u.   Renne Crigler, PA-C 10/06/12 2021

## 2012-10-06 NOTE — ED Notes (Signed)
Patient reports that she has stage IV colon cancer and her pain regime is not working for her. Patient states she is having extreme mid and lower abdominal pain and lower back pain.

## 2012-10-07 ENCOUNTER — Other Ambulatory Visit: Payer: Self-pay | Admitting: *Deleted

## 2012-10-07 DIAGNOSIS — C189 Malignant neoplasm of colon, unspecified: Secondary | ICD-10-CM

## 2012-10-07 MED ORDER — OXYCODONE HCL 5 MG PO TABS: 5.0000 mg | ORAL_TABLET | Freq: Four times a day (QID) | ORAL | Status: DC | PRN

## 2012-10-07 NOTE — Telephone Encounter (Signed)
Message received from operators that this patient called reporting she is out of pain meds.  Called her to learn she went to [facility] as scheduled to see Dr. Chauncey Mann.  Was told at [facility] she could not be seen because she does not have insurance.  Re-applied for Medicare last week and was told this takes 45 days.  Reports she can't take the pain and went to ER yesterday.  Was not a no show but was told not to come to [facility] due to appointment that ws scheduled at [facility].  Pain meds only lasts 2 to 3 hours.  Dr. Clelia Croft notified and authorized refill and to reschedule f/u at [facility].  Called patient to pick up script and suggest she try heating pad and have a bowel regimen due to use of pain meds.  Heating pad to front and back currently in use.  Using Mira lax in morning and stool softener at night for bowels.

## 2012-10-08 ENCOUNTER — Telehealth: Payer: Self-pay | Admitting: Oncology

## 2012-10-08 NOTE — Telephone Encounter (Signed)
Pt called for appt w/FS. Per 7/7 pof next avail w/FS or KC. Pt given appt for 7/9 @ 9:30am.

## 2012-10-09 ENCOUNTER — Encounter: Payer: Self-pay | Admitting: Oncology

## 2012-10-09 ENCOUNTER — Ambulatory Visit (HOSPITAL_BASED_OUTPATIENT_CLINIC_OR_DEPARTMENT_OTHER): Payer: Self-pay | Admitting: Oncology

## 2012-10-09 ENCOUNTER — Telehealth: Payer: Self-pay | Admitting: Oncology

## 2012-10-09 VITALS — BP 127/76 | HR 119 | Temp 97.7°F | Resp 20 | Ht 67.0 in | Wt 133.4 lb

## 2012-10-09 DIAGNOSIS — C189 Malignant neoplasm of colon, unspecified: Secondary | ICD-10-CM

## 2012-10-09 MED ORDER — FENTANYL 25 MCG/HR TD PT72: 1.0000 | MEDICATED_PATCH | TRANSDERMAL | Status: DC

## 2012-10-09 MED ORDER — OXYCODONE HCL 5 MG PO TABS: 5.0000 mg | ORAL_TABLET | Freq: Four times a day (QID) | ORAL | Status: DC | PRN

## 2012-10-09 NOTE — Progress Notes (Signed)
Patient has been advised must bring back check stub by Friday 10/11/12 in order to receive any more of the 400.00 grant.

## 2012-10-09 NOTE — Progress Notes (Signed)
Hematology and Oncology Follow Up Visit  [name] [id number] [birth year] 37 y.o. 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  Prior Therapy:  1. S/P laparoscopic assisted sigmoid colectomy on 08/05/10 for a 5 cm well-differentiated adenocarcinoma with 1/20 positive lymph nodes. 2. S/P temporary ileostomy which was reversed on 10/04/10. 3. Received FOLFOX 6 on 11/07/10 and received 2 cycles. She took a month off for personal issues and resumes treatment with a 15% dose reduction due to thrombocytopenia. Completed a total of 7 cycles on 03/07/11. She was thereafter lost to follow-up  Current therapy: FOLFIRI initiated on 02/28/12. She is here for cycle 5. She started Avastin with cycle 2. She has been off treatment since 08/2012.  Interim History:  [name] is seen for routine follow-up today. She has missed several appointment over the last few months. She continues to have right sided abdominal pain around to her flank. Uses Oxycodone 3-4 times per day. No recent nausea or vomiting. Stools alternate between diarrhea and constipation. Denies blood in her stool or urine. Denies chest pain, shortness of breath, dyspnea. She has been referred to [facility] for consideration of surgery, but has not been able be fully evaluated for surgery due to insurance issues. In the mean time, her pain is getting worse and getting more debilitated as we speak.   Medications: I have reviewed the patient's current medications.  Current Outpatient Prescriptions  Medication Sig Dispense Refill  . albuterol (PROAIR HFA) 108 (90 BASE) MCG/ACT inhaler Inhale 2 puffs into the lungs every 4 (four) hours as needed for wheezing or shortness of breath.       . dexamethasone (DECADRON) 4 MG tablet Take 8 mg by mouth as directed. Taken on first 3 days  post-chemo.      . diphenoxylate-atropine (LOMOTIL) 2.5-0.025 MG per tablet Take 2 tablets by mouth 4 (four) times daily as needed for diarrhea or loose stools.  60 tablet  1  . Docusate Sodium (STOOL SOFTENER) 100 MG capsule Take 100-200 mg by mouth 2 (two) times daily as needed for constipation.       Marland Kitchen esomeprazole (NEXIUM) 40 MG capsule Take 40 mg by mouth daily before breakfast.      . fentaNYL (DURAGESIC - DOSED MCG/HR) 25 MCG/HR Place 1 patch (25 mcg total) onto the skin every 3 (three) days.  10 patch  0  . lidocaine-prilocaine (EMLA) cream Apply 1 application topically as needed (for port-a-cath access).      . LORazepam (ATIVAN) 0.5 MG tablet Take 0.5 mg by mouth every 8 (eight) hours as needed (for nausea).      . ondansetron (ZOFRAN) 8 MG tablet Take 8 mg by mouth every 12 (twelve) hours as needed for nausea.      Marland Kitchen oxyCODONE (OXY IR/ROXICODONE) 5 MG immediate release tablet Take 1-2 tablets (5-10 mg total) by mouth every 6 (six) hours as needed for pain.  60 tablet  0  . polyethylene glycol (MIRALAX / GLYCOLAX) packet Take 17 g by mouth daily as needed (for constipation).      . propranolol (INDERAL) 40 MG tablet Take 40 mg by mouth daily as needed (for hypertension).       . propylthiouracil (PTU) 50 MG tablet Take 100 mg by mouth 3 (three) times daily.        No  current facility-administered medications for this visit.    Allergies:  Allergies  Allergen Reactions  . Codeine Hives  . Penicillins Hives  . Sulfa Antibiotics Hives    Past Medical History, Surgical history, Social history, and Family History were reviewed and updated.  Review of Systems: Constitutional:  Negative for chills, night sweats, anorexia, weight loss, pain. Cardiovascular: no chest pain or dyspnea on exertion Respiratory: no cough, shortness of breath, or wheezing Neurological: no TIA or stroke symptoms Dermatological: negative ENT: negative Skin: Negative. Gastrointestinal: positive for -  abdominal pain negative for - appetite loss or melena Genito-Urinary: no dysuria, trouble voiding, or hematuria Hematological and Lymphatic: negative Breast: negative for breast lumps Musculoskeletal: negative Remaining ROS negative.  Physical Exam: Blood pressure 127/76, pulse 119, temperature 97.7 F (36.5 C), temperature source Oral, resp. rate 20, height 5\' 7"  (1.702 m), weight 133 lb 6.4 oz (60.51 kg). ECOG: 1 General appearance: alert, cooperative and no distress Head: Normocephalic, without obvious abnormality, atraumatic Neck: no adenopathy, no carotid bruit, no JVD, supple, symmetrical, trachea midline and thyroid not enlarged, symmetric, no tenderness/mass/nodules Lymph nodes: Cervical, supraclavicular, and axillary nodes normal. Heart:regular rate and rhythm, S1, S2 normal, no murmur, click, rub or gallop Lung:chest with expiratory wheezing. No rales, normal symmetric air entry, no tachypnea, retractions or cyanosis Abdomen: soft, normal bowel sounds and mass located in the lower abdomen EXT:no erythema, induration, or nodules   Lab Results: Lab Results  Component Value Date   WBC 10.7* 10/06/2012   HGB 12.2 10/06/2012   HCT 37.5 10/06/2012   MCV 82.2 10/06/2012   PLT 222 10/06/2012     Chemistry      Component Value Date/Time   NA 139 10/06/2012 1745   NA 142 09/03/2012 0934   K 3.5 10/06/2012 1745   K 3.9 09/03/2012 0934   CL 100 10/06/2012 1745   CL 106 09/03/2012 0934   CO2 30 10/06/2012 1745   CO2 28 09/03/2012 0934   BUN 9 10/06/2012 1745   BUN 5.9* 09/03/2012 0934   CREATININE 0.54 10/06/2012 1745   CREATININE 0.7 09/03/2012 0934      Component Value Date/Time   CALCIUM 9.6 10/06/2012 1745   CALCIUM 9.3 09/03/2012 0934   ALKPHOS 109 10/06/2012 1745   ALKPHOS 108 09/03/2012 0934   AST 21 10/06/2012 1745   AST 12 09/03/2012 0934   ALT 15 10/06/2012 1745   ALT 10 09/03/2012 0934   BILITOT 0.4 10/06/2012 1745   BILITOT 0.57 09/03/2012 0934      Impression and Plan:  This is a 37 year old  female with the following issues:  1. Stage IV colon cancer. Here for cycle 5 of FOLFIRI/Avastin today. Last CT scan on 06/2012 showed a large pelvis tumor that not really responding to chemotherapy. We have explored surgical options but this has been difficult due to transportation issues as well as insurance issues. She is really in a difficult spot especially with her non-compliance. Options will be to resume chemotherapy vs consider palliative radiation to the pelvic mass. I will set her up with radiation oncology evaluation.   2. Abdominal pain. Due to colon ca. I have refilled her Oxycodone today. I started her on Fentanyl patch and will have social work assists her in obtaining these Rx.   3. Nausea/vomiting prophylaxis. She has Zofran, Compazine, and Ativan at home.  4. Follow-up. In 3 weeks for revaluation.      Phoenicia Pirie 7/9/201410:28 AM

## 2012-10-09 NOTE — Telephone Encounter (Signed)
gv and printed appt sched and avs for pt.Marland KitchenMarland KitchenMarland KitchenPt sched with Dr. Roselind Messier @ 9:30am on 7.16.14

## 2012-10-09 NOTE — Progress Notes (Signed)
Patient will bring me back her chck stub-disability. 400.00 [employer] grant given to her for meds only. She has applied for medicaid in [facility].She claims she was approved but never got a card.

## 2012-10-11 ENCOUNTER — Telehealth: Payer: Self-pay | Admitting: *Deleted

## 2012-10-11 NOTE — Telephone Encounter (Signed)
Patient called and left VM @ 1619, stating the pain patch is helping, but she is having more break-thru pain.  Patient's voice was slurred and the remainder of the message was difficult to understand. i called patient back and got her answering machine. Left her a message to call me back.

## 2012-10-15 ENCOUNTER — Encounter: Payer: Self-pay | Admitting: Radiation Oncology

## 2012-10-15 ENCOUNTER — Other Ambulatory Visit: Payer: Self-pay

## 2012-10-15 DIAGNOSIS — C189 Malignant neoplasm of colon, unspecified: Secondary | ICD-10-CM

## 2012-10-15 MED ORDER — OXYCODONE HCL 5 MG PO TABS: 5.0000 mg | ORAL_TABLET | Freq: Four times a day (QID) | ORAL | Status: DC | PRN

## 2012-10-15 NOTE — Progress Notes (Signed)
GI Location of Tumor / Histology: sigmoid colon, mets to liver, ovaries, possibly lung  Patient presented 12+ months ago with symptoms of: pelvic abscess, colonoscopy during work up  Biopsies of sigmoid colon  (if applicable) revealed: adenocarcinoma, 07/25/10  Past/Anticipated interventions by surgeon, if any: 08/05/10 lap choley, lap-assisted sigmoid colectomy, appendectomy, loop ileostomy, rigid sigmoidoscopy  Past/Anticipated interventions by medical oncology, if any:  . Received FOLFOX 6 on 11/07/10 and received 2 cycles. She took a month off for personal issues and resumes treatment with a 15% dose reduction due to thrombocytopenia. Completed a total of 7 cycles on 03/07/11. She was thereafter lost to follow-up  Current therapy: FOLFIRI initiated on 02/28/12. She is here for cycle 5. She started Avastin with cycle 2. She has been off treatment since 08/2012.   Weight changes, if any: lost 25 lbs in past 4 mos  Bowel/Bladder complaints, if any: stools alternate between diarrhea/constipation  Nausea / Vomiting, if any: no  Pain issues, if any:  Right sided abdominal pain around her flank  SAFETY ISSUES:  Prior radiation? no  Pacemaker/ICD? no  Possible current pregnancy? no  Is the patient on methotrexate? no  Current Complaints / other details:

## 2012-10-15 NOTE — Telephone Encounter (Signed)
Rx for Oxy-IR 5mg  ready for patient; voice message left.

## 2012-10-16 ENCOUNTER — Telehealth: Payer: Self-pay | Admitting: *Deleted

## 2012-10-16 ENCOUNTER — Ambulatory Visit: Admission: RE | Admit: 2012-10-16 | Payer: Self-pay | Source: Ambulatory Visit

## 2012-10-16 ENCOUNTER — Ambulatory Visit
Admission: RE | Admit: 2012-10-16 | Discharge: 2012-10-16 | Disposition: A | Discharge status: Home / Self Care | Payer: Self-pay | Source: Ambulatory Visit | Attending: Radiation Oncology | Admitting: Radiation Oncology

## 2012-10-16 NOTE — Telephone Encounter (Signed)
Called pt to check on her status of arrival for new consult. Pt stated she "had just woke up and was getting in her car". Discussed w/Dr Roselind Messier, and due to his schedule today, called pt back to reschedule her at Dr Trina Ao request. Ms Magadan stated she had written down 10 am for her appointment then looked at her information from the hospital and saw that she was to arrive at 9:30 am. Informed pt that due to Dr Trina Ao schedule today we would kindly need to reschedule her new consultation. Pt stated she was fine with that. Gave pt's contact information to [employer], medical secretary to reschedule pt, and informed her pt's home phone listing is invalid number and to call her mobile #. R Flynt verbalized understanding.

## 2012-10-18 ENCOUNTER — Other Ambulatory Visit: Payer: Self-pay | Admitting: Oncology

## 2012-10-18 ENCOUNTER — Encounter: Payer: Self-pay | Admitting: Oncology

## 2012-10-18 NOTE — Progress Notes (Signed)
See previous notes. The patient was to bring in check copy by 10/11/12. She received meds 10/09/12 and was told could fill that one,but no more per grant until proof of income was received.

## 2012-10-21 ENCOUNTER — Other Ambulatory Visit: Payer: Self-pay | Admitting: *Deleted

## 2012-10-21 ENCOUNTER — Telehealth: Payer: Self-pay | Admitting: *Deleted

## 2012-10-21 DIAGNOSIS — C189 Malignant neoplasm of colon, unspecified: Secondary | ICD-10-CM

## 2012-10-21 MED ORDER — OXYCODONE HCL 5 MG PO TABS: 5.0000 mg | ORAL_TABLET | Freq: Four times a day (QID) | ORAL | Status: DC | PRN

## 2012-10-21 NOTE — Telephone Encounter (Signed)
Signed Oxycodone script left at front for patient p/u. Per dr Clelia Croft, give  Patch time to work, apply patch to clean, dry, fatty area, as opposed to a boney prominence. Let us know how this works with in the next 2-3 days.

## 2012-10-21 NOTE — Telephone Encounter (Signed)
Patient calling to request a refill on oxycodone, only has enough to last until tomorrow am. Also fentanyl patches ( 25 mcg ) not holding her pain and they are irritating her skin. Note to dr Alver Fisher desk.

## 2012-10-22 ENCOUNTER — Other Ambulatory Visit: Payer: Self-pay | Admitting: Oncology

## 2012-10-23 ENCOUNTER — Ambulatory Visit: Admission: RE | Admit: 2012-10-23 | Payer: Self-pay | Source: Ambulatory Visit | Admitting: Radiation Oncology

## 2012-10-23 ENCOUNTER — Encounter: Payer: Self-pay | Admitting: Radiation Oncology

## 2012-10-23 ENCOUNTER — Ambulatory Visit
Admission: RE | Admit: 2012-10-23 | Discharge: 2012-10-23 | Disposition: A | Discharge status: Home / Self Care | Payer: Self-pay | Source: Ambulatory Visit | Attending: Radiation Oncology | Admitting: Radiation Oncology

## 2012-10-23 VITALS — BP 114/69 | HR 97 | Temp 97.5°F | Resp 20 | Wt 127.1 lb

## 2012-10-23 DIAGNOSIS — Z79899 Other long term (current) drug therapy: Secondary | ICD-10-CM | POA: Insufficient documentation

## 2012-10-23 DIAGNOSIS — C787 Secondary malignant neoplasm of liver and intrahepatic bile duct: Secondary | ICD-10-CM | POA: Insufficient documentation

## 2012-10-23 DIAGNOSIS — C189 Malignant neoplasm of colon, unspecified: Secondary | ICD-10-CM | POA: Insufficient documentation

## 2012-10-23 MED ORDER — OXYCODONE HCL 20 MG PO TB12: 20.0000 mg | ORAL_TABLET | Freq: Two times a day (BID) | ORAL | Status: DC

## 2012-10-23 NOTE — Progress Notes (Signed)
Please see the Nurse Progress Note in the MD Initial Consult Encounter for this patient. 

## 2012-10-23 NOTE — Progress Notes (Signed)
Patient advised that she has no insurance and that she is waiting to hear from [facility] and waiting on her medicare.

## 2012-10-23 NOTE — Progress Notes (Addendum)
GI Location of Tumor / Histology: sigmoid colon, mets to liver, ovaries, possibly lung   Patient presented 12+ months ago with symptoms of: pelvic abscess, colonoscopy during work up   Biopsies of sigmoid colon (if applicable) revealed: adenocarcinoma, 07/25/10  Past/Anticipated interventions by surgeon, if any: 08/05/10 lap choley, lap-assisted sigmoid colectomy, appendectomy, loop ileostomy, rigid sigmoidoscopy . Ileostomy later closed.  Past/Anticipated interventions by medical oncology, if any:  . Received FOLFOX 6 on 11/07/10 and received 2 cycles. She took a month off for personal issues and resumes treatment with a 15% dose reduction due to thrombocytopenia. Completed a total of 7 cycles on 03/07/11. She was thereafter lost to follow-up   Current therapy: FOLFIRI initiated on 02/28/12. She is here for cycle 5. She started Avastin with cycle 2. She has been off treatment since 08/2012.   Weight changes, if any: lost 25 lbs in past 4 mos  Bowel/Bladder complaints, if any: stools alternate between diarrhea/constipation , takes Miralax daily, has BM daily which is watery at start but pt states "it gets normal". Nausea / Vomiting, if any: when pt is in extreme pain she has n/v, takes Zofran prn Pain issues, if any: Right sided abdominal pain around her flank, sharp at times  SAFETY ISSUES:  Prior radiation? no  Pacemaker/ICD? no  Possible current pregnancy? no  Is the patient on methotrexate? No  Current Complaints / other details:  Single parent of 3 teenagers, worked in [employer]. Pt states pain has increased in past 3 weeks, goes across her lower abdomen and radiates to her back. She states it is painful to urinate and have BM's. She states she cannot wear Fentanyl patch due to skin irritation. She takes Oxy-IR 5 mg, 2 tabs every 6 hours w/fair relief. She states her pain level is never below a 4. Denies rectal bleeding.

## 2012-10-23 NOTE — Progress Notes (Signed)
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   HISTORY OF PRESENT ILLNESS::[name] is a 37 y.o. female who is seen out courtesy of Dr. Eli Hose for an opinion concerning radiation therapy as part of management of patient's progressive advanced adenocarcinoma of the colon. The patient was diagnosed in May of 2012.  S/P laparoscopic assisted sigmoid colectomy on 08/05/10 for a 5 cm well-differentiated adenocarcinoma with 1/20 positive lymph nodes. S/P temporary ileostomy which was reversed on 10/04/10. She received FOLFOX 6 on 11/07/10 and received 2 cycles. She took a month off for personal issues and resumes treatment with a 15% dose reduction due to thrombocytopenia. Completed a total of 7 cycles on 03/07/11. She was thereafter lost to follow-up.  FOLFIRI initiated on 02/28/12.  She started Avastin with cycle 2. She has been off treatment since 08/2012.  She has missed several appointment over the last few months. She continues to have right sided abdominal pain and pelvis pain . Uses Oxycodone 3-4 times per day. No recent nausea or vomiting. Stools alternate between diarrhea and constipation. Denies blood in her stool or urine. Denies chest pain, shortness of breath, dyspnea. She has been referred to [facility] for consideration of surgery, but has not been able be fully evaluated for surgery due to insurance issues. In the mean time, her pain is getting worse and getting more debilitated as we speak. In light of these issues radiation therapy is been consulted for consideration for palliative treatment.   PREVIOUS RADIATION THERAPY: No  PAST MEDICAL HISTORY:   has a past medical history of Asthma; Gallstones; Diverticulosis of colon (without mention of hemorrhage); Esophageal reflux; Urinary tract infection; Depression; Anxiety; Chronic headaches; Recurrent miscarriages; Allergy; Hyperthyroidism; Seizures; colon ca (dx'd 07/26/10); Grave's disease; Hypertension; Asthma; and Colon cancer (07/25/10).    PAST SURGICAL HISTORY: Past Surgical History  Procedure Laterality Date  . Dilation and curettage of uterus    . Johnsten drain    . Orif acetabular fracture    . Metal plate  [id number]    Right hand  . Colon surgery  08/05/10    lap assisted sigmoid colectomy  . Portacath placement    . Hand surgery      Right hand surgery    FAMILY HISTORY: family history includes Asthma in her sister; Breast cancer in her maternal grandmother; COPD in her father; Cervical cancer in her mother; Colon cancer in her father and paternal grandfather; Colon polyps in her father; Diabetes in her father; Kidney disease in her mother; and Lung cancer in her brother.  SOCIAL HISTORY:  reports that she has been smoking Cigarettes.  She has a 20 pack-year smoking history. She has never used smokeless tobacco. She reports that she does not drink alcohol or use illicit drugs.  ALLERGIES: Codeine; Penicillins; and Sulfa antibiotics  MEDICATIONS:  Current Outpatient Prescriptions  Medication Sig Dispense Refill  . albuterol (PROAIR HFA) 108 (90 BASE) MCG/ACT inhaler Inhale 2 puffs into the lungs every 4 (four) hours as needed for wheezing or shortness of breath.       . dexamethasone (DECADRON) 4 MG tablet Take 8 mg by mouth as directed.  Taken on first 3 days post-chemo.      . diphenoxylate-atropine (LOMOTIL) 2.5-0.025 MG per tablet Take 2 tablets by mouth 4 (four) times daily as needed for diarrhea or loose stools.  60 tablet  1  . Docusate Sodium (STOOL SOFTENER) 100 MG capsule Take 100-200 mg by mouth 2 (two) times daily as needed for constipation.       Marland Kitchen esomeprazole (NEXIUM)  40 MG capsule Take 40 mg by mouth daily before breakfast.      . fentaNYL (DURAGESIC - DOSED MCG/HR) 25 MCG/HR Place 1 patch (25 mcg total) onto the skin every 3 (three) days.  10 patch  0  . lidocaine-prilocaine (EMLA) cream Apply 1 application topically as needed (for port-a-cath access).      . LORazepam (ATIVAN) 0.5 MG tablet Take 0.5 mg by mouth every 8 (eight) hours as needed (for nausea).      . ondansetron (ZOFRAN) 8 MG tablet Take 8 mg by mouth every 12 (twelve) hours as needed for nausea.      Marland Kitchen oxyCODONE (OXY IR/ROXICODONE) 5 MG immediate release tablet Take 1-2 tablets (5-10 mg total) by mouth every 6 (six) hours as needed for pain.  60 tablet  0  . polyethylene glycol (MIRALAX / GLYCOLAX) packet Take 17 g by mouth daily as needed (for constipation).      . propranolol (INDERAL) 40 MG tablet Take 40 mg by mouth daily as needed (for hypertension).       . propylthiouracil (PTU) 50 MG tablet Take 100 mg by mouth 3 (three) times daily.       Marland Kitchen oxyCODONE (OXYCONTIN) 20 MG 12 hr tablet Take 1 tablet (20 mg total) by mouth every 12 (twelve) hours.  20 tablet  0   No current facility-administered medications for this encounter.    REVIEW OF SYSTEMS:  A 15 point review of systems is documented in the electronic medical record. This was obtained by the nursing staff. However, I reviewed this with the patient to discuss relevant findings and make appropriate changes.  Abdomen and pelvis pain as above. She denies any vaginal bleeding or discharge. She denies any blood in her stools or hematuria.   PHYSICAL EXAM:  weight is 127 lb 1.6 oz (57.652 kg). Her oral temperature is 97.5 F (36.4 C). Her blood pressure is 114/69 and her pulse is 97. Her respiration is 20.   BP 114/69  Pulse 97  Temp(Src) 97.5 F (36.4 C) (Oral)  Resp 20  Wt 127 lb 1.6 oz (57.652 kg)  BMI 19.9 kg/m2  General Appearance:    Alert, cooperative, mild distress, appears stated age  Head:    Normocephalic, without obvious  abnormality, atraumatic  Eyes:    PERRL, conjunctiva/corneas clear, EOM's intact, significant proptosis related to Graves' disease       Nose:   Nares normal, septum midline, mucosa normal, no drainage    or sinus tenderness  Throat:   Lips, mucosa, and tongue normal with tongue ring in place; edentulous, gums normal  Neck:   Supple, symmetrical, trachea midline, no adenopathy;    thyroid:  no enlargement/tenderness/nodules; no carotid   bruit or JVD  Back:     Symmetric, no curvature, ROM normal, no CVA tenderness  Lungs:     Clear to auscultation bilaterally, respirations unlabored  Chest Wall:    No tenderness or deformity   Heart:    Regular rate and rhythm, S1 and S2 normal, no murmur, rub   or gallop  Abdomen:     large mass in the lower abdomen and upper pelvis area. This mass is tender with palpation. Bowel sounds are normal         Extremities:   Extremities normal, atraumatic, no cyanosis or edema  Pulses:   2+ and symmetric all extremities  Skin:   Skin color, texture, turgor normal, no rashes or lesions  Lymph nodes:   Cervical, supraclavicular, and axillary nodes normal  Neurologic:    normal strength, sensation and reflexes    throughout    LABORATORY DATA:  Lab Results  Component Value Date   WBC 10.7* 10/06/2012   HGB 12.2 10/06/2012   HCT 37.5 10/06/2012   MCV 82.2 10/06/2012   PLT 222 10/06/2012   Lab Results  Component Value Date   NA 139 10/06/2012   K 3.5 10/06/2012   CL 100 10/06/2012   CO2 30 10/06/2012   Lab Results  Component Value Date   ALT 15 10/06/2012   AST 21 10/06/2012   ALKPHOS 109 10/06/2012   BILITOT 0.4 10/06/2012     RADIOGRAPHY: No results found.    IMPRESSION: Stage IV colon cancer. The patient is quite symptomatic with her large mass occupying the pelvis and lower abnormal area.  The patient would be a candidate for palliative radiation therapy directed to this mass. I anticipate approximately 3 weeks of radiation therapy  PLAN: Simulation and  planning was attempted today however the patient could not lie on the CT simulator. I have given the patient a prescription for OxyContin to take along with your OxyIR so as to have better pain relief. The patient was intolerant of her Duragesic patch in light of itching. I advised patient not to drive her simulation appointment in the event that she needs intramuscular pain medication for this procedure  I spent 60 minutes minutes face to face with the patient and more than 50% of that time was spent in counseling and/or coordination of care.   ------------------------------------------------  -----------------------------------  Billie Lade, PhD, MD

## 2012-10-25 ENCOUNTER — Other Ambulatory Visit: Payer: Self-pay | Admitting: *Deleted

## 2012-10-25 ENCOUNTER — Telehealth: Payer: Self-pay | Admitting: *Deleted

## 2012-10-25 DIAGNOSIS — C189 Malignant neoplasm of colon, unspecified: Secondary | ICD-10-CM

## 2012-10-25 MED ORDER — FENTANYL 25 MCG/HR TD PT72: 1.0000 | MEDICATED_PATCH | TRANSDERMAL | Status: DC

## 2012-10-25 NOTE — Telephone Encounter (Signed)
[facility] calling to say it is too early to refill duragesic patch script, per patient, she has been taking them off and throwing them away d/t skin irritation, now she knows to rotate sites and apply to clean, dry skin. Per dr Clelia Croft, okay to refill script early. Pharmacy notified.

## 2012-10-25 NOTE — Telephone Encounter (Signed)
Patient calling to say her duragesic patches will run out on Sunday, script left at front for patient p/u, before 4:30 pm today.

## 2012-10-28 ENCOUNTER — Ambulatory Visit: Payer: Self-pay | Admitting: Radiation Oncology

## 2012-10-29 ENCOUNTER — Other Ambulatory Visit: Payer: Self-pay | Admitting: *Deleted

## 2012-10-29 ENCOUNTER — Telehealth: Payer: Self-pay | Admitting: *Deleted

## 2012-10-29 DIAGNOSIS — C189 Malignant neoplasm of colon, unspecified: Secondary | ICD-10-CM

## 2012-10-29 MED ORDER — OXYCODONE HCL 5 MG PO TABS: 5.0000 mg | ORAL_TABLET | Freq: Four times a day (QID) | ORAL | Status: DC | PRN

## 2012-10-29 NOTE — Telephone Encounter (Signed)
Patient calling for a refill on her oxycodone 5 mg tablets.

## 2012-10-29 NOTE — Telephone Encounter (Signed)
Left message on answering machine that requested script ready for p/u at front desk.

## 2012-10-30 ENCOUNTER — Ambulatory Visit: Payer: Self-pay | Admitting: Oncology

## 2012-10-30 ENCOUNTER — Telehealth: Payer: Self-pay | Admitting: Oncology

## 2012-10-30 ENCOUNTER — Ambulatory Visit: Payer: Self-pay | Admitting: Radiation Oncology

## 2012-10-30 ENCOUNTER — Other Ambulatory Visit (HOSPITAL_BASED_OUTPATIENT_CLINIC_OR_DEPARTMENT_OTHER): Payer: Self-pay | Admitting: Lab

## 2012-10-30 ENCOUNTER — Ambulatory Visit (HOSPITAL_BASED_OUTPATIENT_CLINIC_OR_DEPARTMENT_OTHER): Payer: Self-pay | Admitting: Oncology

## 2012-10-30 ENCOUNTER — Other Ambulatory Visit: Payer: Self-pay | Admitting: Lab

## 2012-10-30 VITALS — BP 128/83 | HR 111 | Temp 98.2°F | Resp 19 | Ht 67.0 in | Wt 122.4 lb

## 2012-10-30 DIAGNOSIS — C189 Malignant neoplasm of colon, unspecified: Secondary | ICD-10-CM

## 2012-10-30 LAB — COMPREHENSIVE METABOLIC PANEL (CC13)
ALT: 8 U/L (ref 0–55)
AST: 17 U/L (ref 5–34)
CO2: 27 mEq/L (ref 22–29)
Chloride: 100 mEq/L (ref 98–109)
Creatinine: 0.7 mg/dL (ref 0.6–1.1)
Sodium: 138 mEq/L (ref 136–145)
Total Bilirubin: 0.85 mg/dL (ref 0.20–1.20)
Total Protein: 7.8 g/dL (ref 6.4–8.3)

## 2012-10-30 LAB — CBC WITH DIFFERENTIAL/PLATELET
BASO%: 0.7 % (ref 0.0–2.0)
EOS%: 0.8 % (ref 0.0–7.0)
LYMPH%: 12.3 % — ABNORMAL LOW (ref 14.0–49.7)
MCH: 25.9 pg (ref 25.1–34.0)
MCHC: 32.9 g/dL (ref 31.5–36.0)
MONO#: 0.7 10*3/uL (ref 0.1–0.9)
RBC: 4.78 10*6/uL (ref 3.70–5.45)
WBC: 11 10*3/uL — ABNORMAL HIGH (ref 3.9–10.3)
lymph#: 1.4 10*3/uL (ref 0.9–3.3)

## 2012-10-30 MED ORDER — OXYCODONE HCL 20 MG PO TB12: ORAL_TABLET | ORAL | Status: DC

## 2012-10-30 NOTE — Progress Notes (Signed)
Hematology and Oncology Follow Up Visit  DYLIN IHNEN [id number] [birth year] 37 y.o. 10/30/2012 12:40 PM CAMPBELL, STEPHEN D., MDCampbell, Mila Homer., MD   Principle Diagnosis: 37 year old with stage IV KRAS mutated adenocarcinoma of the colon with metastasis to the liver, both ovaries, and possibly lung.  Prior Therapy:  1. S/P laparoscopic assisted sigmoid colectomy on 08/05/10 for a 5 cm well-differentiated adenocarcinoma with 1/20 positive lymph nodes. 2. S/P temporary ileostomy which was reversed on 10/04/10. 3. Received FOLFOX 6 on 11/07/10 and received 2 cycles. She took a month off for personal issues and resumes treatment with a 15% dose reduction due to thrombocytopenia. Completed a total of 7 cycles on 03/07/11. She was thereafter lost to follow-up. 4. FOLFIRI initiated on 02/28/12. She is here for cycle 5. She started Avastin with cycle 2. She has been off treatment since 08/2012.  Current therapy: Radiation therapy for salvage palliative purpose planned in the near future.  Interim History:  Ms Dejarnett is seen for routine follow-up today. She is about to start radiation for palliative purpose. She continues to have right sided abdominal pain around to her flank. Uses Oxycodone 3-4 times per day and OxyContin twice a day. No recent nausea or vomiting. Stools alternate between diarrhea and constipation. Denies blood in her stool or urine. Denies chest pain, shortness of breath, dyspnea. She has been referred to [facility] for consideration of surgery, but has not been able be fully evaluated for surgery due to insurance issues. In the mean time, her pain is getting worse and getting more debilitated as we speak.   Medications: I have reviewed the patient's current medications.  Current Outpatient Prescriptions  Medication Sig Dispense Refill  . albuterol (PROAIR HFA) 108 (90 BASE) MCG/ACT inhaler Inhale 2 puffs into the lungs every 4 (four) hours as needed for wheezing or shortness of breath.        . dexamethasone (DECADRON) 4 MG tablet Take 8 mg by mouth as directed. Taken on first 3 days post-chemo.      . diphenoxylate-atropine (LOMOTIL) 2.5-0.025 MG per tablet Take 2 tablets by mouth 4 (four) times daily as needed for diarrhea or loose stools.  60 tablet  1  . Docusate Sodium (STOOL SOFTENER) 100 MG capsule Take 100-200 mg by mouth 2 (two) times daily as needed for constipation.       Marland Kitchen esomeprazole (NEXIUM) 40 MG capsule Take 40 mg by mouth daily before breakfast.      . fentaNYL (DURAGESIC - DOSED MCG/HR) 25 MCG/HR Place 1 patch (25 mcg total) onto the skin every 3 (three) days.  10 patch  0  . lidocaine-prilocaine (EMLA) cream Apply 1 application topically as needed (for port-a-cath access).      . LORazepam (ATIVAN) 0.5 MG tablet Take 0.5 mg by mouth every 8 (eight) hours as needed (for nausea).      . ondansetron (ZOFRAN) 8 MG tablet Take 8 mg by mouth every 12 (twelve) hours as needed for nausea.      Marland Kitchen oxyCODONE (OXY IR/ROXICODONE) 5 MG immediate release tablet Take 1-2 tablets (5-10 mg total) by mouth every 6 (six) hours as needed for pain.  60 tablet  0  . oxyCODONE (OXYCONTIN) 20 MG 12 hr tablet Take one tablet every 8 hours.  90 tablet  0  . polyethylene glycol (MIRALAX / GLYCOLAX) packet Take 17 g by mouth daily as needed (for constipation).      . propranolol (INDERAL) 40 MG tablet Take 40 mg  by mouth daily as needed (for hypertension).       . propylthiouracil (PTU) 50 MG tablet Take 100 mg by mouth 3 (three) times daily.        No current facility-administered medications for this visit.    Allergies:  Allergies  Allergen Reactions  . Codeine Hives  . Penicillins Hives  . Sulfa Antibiotics Hives    Past Medical History, Surgical history, Social history, and Family History were reviewed and updated.  Review of Systems: Constitutional:  Negative for chills, night sweats, anorexia, weight loss, pain. Cardiovascular: no chest pain or dyspnea on  exertion Respiratory: no cough, shortness of breath, or wheezing Neurological: no TIA or stroke symptoms Dermatological: negative ENT: negative Skin: Negative. Gastrointestinal: positive for - abdominal pain negative for - appetite loss or melena Genito-Urinary: no dysuria, trouble voiding, or hematuria Hematological and Lymphatic: negative Breast: negative for breast lumps Musculoskeletal: negative Remaining ROS negative.  Physical Exam: Blood pressure 128/83, pulse 111, temperature 98.2 F (36.8 C), temperature source Oral, resp. rate 19, height 5\' 7"  (1.702 m), weight 122 lb 6.4 oz (55.52 kg), SpO2 100.00%. ECOG: 1 General appearance: alert, cooperative and no distress Head: Normocephalic, without obvious abnormality, atraumatic Neck: no adenopathy, no carotid bruit, no JVD, supple, symmetrical, trachea midline and thyroid not enlarged, symmetric, no tenderness/mass/nodules Lymph nodes: Cervical, supraclavicular, and axillary nodes normal. Heart:regular rate and rhythm, S1, S2 normal, no murmur, click, rub or gallop Lung:chest with expiratory wheezing. No rales, normal symmetric air entry, no tachypnea, retractions or cyanosis Abdomen: soft, normal bowel sounds and mass located in the lower abdomen EXT:no erythema, induration, or nodules   Lab Results: Lab Results  Component Value Date   WBC 11.0* 10/30/2012   HGB 12.4 10/30/2012   HCT 37.7 10/30/2012   MCV 78.9* 10/30/2012   PLT 292 10/30/2012     Chemistry      Component Value Date/Time   NA 138 10/30/2012 1151   NA 139 10/06/2012 1745   K 4.3 10/30/2012 1151   K 3.5 10/06/2012 1745   CL 100 10/06/2012 1745   CL 106 09/03/2012 0934   CO2 27 10/30/2012 1151   CO2 30 10/06/2012 1745   BUN 9.7 10/30/2012 1151   BUN 9 10/06/2012 1745   CREATININE 0.7 10/30/2012 1151   CREATININE 0.54 10/06/2012 1745      Component Value Date/Time   CALCIUM 10.3 10/30/2012 1151   CALCIUM 9.6 10/06/2012 1745   ALKPHOS 137 10/30/2012 1151   ALKPHOS 109  10/06/2012 1745   AST 17 10/30/2012 1151   AST 21 10/06/2012 1745   ALT 8 10/30/2012 1151   ALT 15 10/06/2012 1745   BILITOT 0.85 10/30/2012 1151   BILITOT 0.4 10/06/2012 1745      Impression and Plan:  This is a 37 year old female with the following issues:  1. Stage IV colon cancer. Here for cycle 5 of FOLFIRI/Avastin today. Last CT scan on 06/2012 showed a large pelvis tumor that not really responding to chemotherapy. We have explored surgical options but this has been difficult due to transportation issues as well as insurance issues.  The plan is to proceed with palliative radiation therapy to her pelvic mass for now and will consider other options upon completion of therapy.   2. Abdominal pain. Due to colon ca. She did not think Fentanyl patches helped. She was started on OxyContin twice a day by Dr. Roselind Messier but feels that helps but not lasting 12 hours. I asked her  to do it q 8 hours for now.     3. Nausea/vomiting prophylaxis. She has Zofran, Compazine, and Ativan at home.  4. Follow-up. In 6 weeks for revaluation.      Rilie Glanz 7/30/201412:40 PM

## 2012-10-30 NOTE — Telephone Encounter (Signed)
Gave pt appt for lab,MD and Ml appt for August,September 2014

## 2012-10-31 ENCOUNTER — Ambulatory Visit: Payer: Self-pay

## 2012-10-31 ENCOUNTER — Ambulatory Visit: Payer: Self-pay | Attending: Radiation Oncology | Admitting: Radiation Oncology

## 2012-11-01 ENCOUNTER — Ambulatory Visit: Payer: Self-pay

## 2012-11-01 ENCOUNTER — Other Ambulatory Visit: Payer: Self-pay | Admitting: Oncology

## 2012-11-04 ENCOUNTER — Telehealth: Payer: Self-pay | Admitting: *Deleted

## 2012-11-04 ENCOUNTER — Ambulatory Visit: Payer: Self-pay

## 2012-11-04 DIAGNOSIS — C189 Malignant neoplasm of colon, unspecified: Secondary | ICD-10-CM

## 2012-11-04 MED ORDER — OXYCODONE HCL 5 MG PO TABS: 5.0000 mg | ORAL_TABLET | Freq: Four times a day (QID) | ORAL | Status: DC | PRN

## 2012-11-04 NOTE — Telephone Encounter (Signed)
Pt called for refill on her oxycodone 5 mg & states she will be out in the am.  Will get script ready for p/u this pm.

## 2012-11-05 ENCOUNTER — Ambulatory Visit: Payer: Self-pay

## 2012-11-06 ENCOUNTER — Ambulatory Visit: Payer: Self-pay

## 2012-11-07 ENCOUNTER — Ambulatory Visit: Payer: Self-pay

## 2012-11-08 ENCOUNTER — Ambulatory Visit: Payer: Self-pay

## 2012-11-10 ENCOUNTER — Emergency Department (HOSPITAL_COMMUNITY): Payer: Medicare Other

## 2012-11-10 ENCOUNTER — Encounter (HOSPITAL_COMMUNITY): Payer: Self-pay

## 2012-11-10 ENCOUNTER — Inpatient Hospital Stay (HOSPITAL_COMMUNITY)
Admission: EM | Admit: 2012-11-10 | Discharge: 2012-11-13 | DRG: 374 | Disposition: A | Discharge status: Hospice | Payer: Medicare Other | Attending: Internal Medicine | Admitting: Internal Medicine

## 2012-11-10 DIAGNOSIS — G8929 Other chronic pain: Secondary | ICD-10-CM | POA: Diagnosis present

## 2012-11-10 DIAGNOSIS — D72829 Elevated white blood cell count, unspecified: Secondary | ICD-10-CM

## 2012-11-10 DIAGNOSIS — K5909 Other constipation: Secondary | ICD-10-CM | POA: Diagnosis present

## 2012-11-10 DIAGNOSIS — F3289 Other specified depressive episodes: Secondary | ICD-10-CM | POA: Diagnosis present

## 2012-11-10 DIAGNOSIS — J45909 Unspecified asthma, uncomplicated: Secondary | ICD-10-CM

## 2012-11-10 DIAGNOSIS — Z681 Body mass index (BMI) 19 or less, adult: Secondary | ICD-10-CM

## 2012-11-10 DIAGNOSIS — D696 Thrombocytopenia, unspecified: Secondary | ICD-10-CM | POA: Diagnosis present

## 2012-11-10 DIAGNOSIS — K219 Gastro-esophageal reflux disease without esophagitis: Secondary | ICD-10-CM | POA: Diagnosis present

## 2012-11-10 DIAGNOSIS — R109 Unspecified abdominal pain: Secondary | ICD-10-CM | POA: Diagnosis present

## 2012-11-10 DIAGNOSIS — C189 Malignant neoplasm of colon, unspecified: Principal | ICD-10-CM

## 2012-11-10 DIAGNOSIS — T4275XA Adverse effect of unspecified antiepileptic and sedative-hypnotic drugs, initial encounter: Secondary | ICD-10-CM | POA: Diagnosis present

## 2012-11-10 DIAGNOSIS — C787 Secondary malignant neoplasm of liver and intrahepatic bile duct: Secondary | ICD-10-CM

## 2012-11-10 DIAGNOSIS — K5900 Constipation, unspecified: Secondary | ICD-10-CM | POA: Diagnosis present

## 2012-11-10 DIAGNOSIS — N390 Urinary tract infection, site not specified: Secondary | ICD-10-CM

## 2012-11-10 DIAGNOSIS — E0500 Thyrotoxicosis with diffuse goiter without thyrotoxic crisis or storm: Secondary | ICD-10-CM | POA: Diagnosis present

## 2012-11-10 DIAGNOSIS — Z79899 Other long term (current) drug therapy: Secondary | ICD-10-CM

## 2012-11-10 DIAGNOSIS — E43 Unspecified severe protein-calorie malnutrition: Secondary | ICD-10-CM

## 2012-11-10 DIAGNOSIS — Z515 Encounter for palliative care: Secondary | ICD-10-CM

## 2012-11-10 DIAGNOSIS — F411 Generalized anxiety disorder: Secondary | ICD-10-CM | POA: Diagnosis present

## 2012-11-10 DIAGNOSIS — C7960 Secondary malignant neoplasm of unspecified ovary: Secondary | ICD-10-CM | POA: Diagnosis present

## 2012-11-10 DIAGNOSIS — D638 Anemia in other chronic diseases classified elsewhere: Secondary | ICD-10-CM | POA: Diagnosis present

## 2012-11-10 DIAGNOSIS — R627 Adult failure to thrive: Secondary | ICD-10-CM

## 2012-11-10 DIAGNOSIS — E860 Dehydration: Secondary | ICD-10-CM | POA: Diagnosis present

## 2012-11-10 DIAGNOSIS — J4489 Other specified chronic obstructive pulmonary disease: Secondary | ICD-10-CM | POA: Diagnosis present

## 2012-11-10 DIAGNOSIS — J309 Allergic rhinitis, unspecified: Secondary | ICD-10-CM | POA: Diagnosis present

## 2012-11-10 DIAGNOSIS — K6389 Other specified diseases of intestine: Secondary | ICD-10-CM | POA: Diagnosis present

## 2012-11-10 DIAGNOSIS — R112 Nausea with vomiting, unspecified: Secondary | ICD-10-CM

## 2012-11-10 DIAGNOSIS — Z801 Family history of malignant neoplasm of trachea, bronchus and lung: Secondary | ICD-10-CM

## 2012-11-10 DIAGNOSIS — F329 Major depressive disorder, single episode, unspecified: Secondary | ICD-10-CM | POA: Diagnosis present

## 2012-11-10 DIAGNOSIS — R000 Tachycardia, unspecified: Secondary | ICD-10-CM | POA: Diagnosis present

## 2012-11-10 DIAGNOSIS — E871 Hypo-osmolality and hyponatremia: Secondary | ICD-10-CM | POA: Diagnosis present

## 2012-11-10 DIAGNOSIS — F17200 Nicotine dependence, unspecified, uncomplicated: Secondary | ICD-10-CM

## 2012-11-10 DIAGNOSIS — E0590 Thyrotoxicosis, unspecified without thyrotoxic crisis or storm: Secondary | ICD-10-CM

## 2012-11-10 DIAGNOSIS — J449 Chronic obstructive pulmonary disease, unspecified: Secondary | ICD-10-CM | POA: Diagnosis present

## 2012-11-10 LAB — CBC
HCT: 40.1 % (ref 36.0–46.0)
MCH: 25.9 pg — ABNORMAL LOW (ref 26.0–34.0)
MCHC: 33.2 g/dL (ref 30.0–36.0)
RDW: 14.5 % (ref 11.5–15.5)

## 2012-11-10 LAB — URINALYSIS, ROUTINE W REFLEX MICROSCOPIC
Nitrite: NEGATIVE
Specific Gravity, Urine: 1.022 (ref 1.005–1.030)
Urobilinogen, UA: 1 mg/dL (ref 0.0–1.0)
pH: 7.5 (ref 5.0–8.0)

## 2012-11-10 LAB — COMPREHENSIVE METABOLIC PANEL
Albumin: 3.4 g/dL — ABNORMAL LOW (ref 3.5–5.2)
Alkaline Phosphatase: 118 U/L — ABNORMAL HIGH (ref 39–117)
BUN: 15 mg/dL (ref 6–23)
Calcium: 10.1 mg/dL (ref 8.4–10.5)
GFR calc Af Amer: >90 mL/min (ref >90)
Glucose, Bld: 158 mg/dL — ABNORMAL HIGH (ref 70–99)
Potassium: 3.6 mEq/L (ref 3.5–5.1)
Sodium: 134 mEq/L — ABNORMAL LOW (ref 135–145)
Total Protein: 7.4 g/dL (ref 6.0–8.3)

## 2012-11-10 LAB — LIPASE, BLOOD: Lipase: 13 U/L (ref 11–59)

## 2012-11-10 LAB — URINE MICROSCOPIC-ADD ON

## 2012-11-10 LAB — PREGNANCY, URINE: Preg Test, Ur: NEGATIVE

## 2012-11-10 LAB — MAGNESIUM: Magnesium: 1.8 mg/dL (ref 1.5–2.5)

## 2012-11-10 MED ORDER — ENOXAPARIN SODIUM 40 MG/0.4ML ~~LOC~~ SOLN
40.0000 mg | SUBCUTANEOUS | Status: DC
  Filled 2012-11-10 (×3): qty 0.4

## 2012-11-10 MED ORDER — LORAZEPAM 2 MG/ML IJ SOLN: 0.5000 mg | Freq: Three times a day (TID) | INTRAMUSCULAR | Status: DC | PRN

## 2012-11-10 MED ORDER — POLYETHYLENE GLYCOL 3350 17 G PO PACK
17.0000 g | PACK | Freq: Every day | ORAL | Status: DC
  Filled 2012-11-10 (×2): qty 1

## 2012-11-10 MED ORDER — DOCUSATE SODIUM 100 MG PO CAPS
100.0000 mg | ORAL_CAPSULE | Freq: Two times a day (BID) | ORAL | Status: DC
  Administered 2012-11-11: 100 mg via ORAL
  Filled 2012-11-10 (×5): qty 1

## 2012-11-10 MED ORDER — PROPRANOLOL HCL 40 MG PO TABS
40.0000 mg | ORAL_TABLET | Freq: Every day | ORAL | Status: DC | PRN
  Filled 2012-11-10: qty 1

## 2012-11-10 MED ORDER — IPRATROPIUM BROMIDE 0.02 % IN SOLN: 0.5000 mg | RESPIRATORY_TRACT | Status: DC | PRN

## 2012-11-10 MED ORDER — HYDROMORPHONE HCL PF 1 MG/ML IJ SOLN
1.0000 mg | Freq: Once | INTRAMUSCULAR | Status: AC
  Administered 2012-11-10: 1 mg via INTRAVENOUS
  Filled 2012-11-10: qty 1

## 2012-11-10 MED ORDER — MORPHINE SULFATE 4 MG/ML IJ SOLN: 4.0000 mg | INTRAMUSCULAR | Status: DC | PRN

## 2012-11-10 MED ORDER — OXYCODONE HCL 5 MG PO TABS
5.0000 mg | ORAL_TABLET | ORAL | Status: DC | PRN
  Administered 2012-11-10 – 2012-11-11 (×6): 10 mg via ORAL
  Administered 2012-11-12: 5 mg via ORAL
  Administered 2012-11-12 – 2012-11-13 (×6): 10 mg via ORAL
  Filled 2012-11-10 (×13): qty 2

## 2012-11-10 MED ORDER — SODIUM CHLORIDE 0.9 % IV SOLN
1000.0000 mL | Freq: Once | INTRAVENOUS | Status: AC
  Administered 2012-11-10: 1000 mL via INTRAVENOUS

## 2012-11-10 MED ORDER — POLYETHYLENE GLYCOL 3350 17 G PO PACK
17.0000 g | PACK | Freq: Every day | ORAL | Status: DC | PRN
  Filled 2012-11-10: qty 1

## 2012-11-10 MED ORDER — NICOTINE 21 MG/24HR TD PT24
21.0000 mg | MEDICATED_PATCH | Freq: Every day | TRANSDERMAL | Status: DC
  Filled 2012-11-10 (×4): qty 1

## 2012-11-10 MED ORDER — LORAZEPAM 2 MG/ML IJ SOLN
0.5000 mg | Freq: Three times a day (TID) | INTRAMUSCULAR | Status: DC | PRN
  Administered 2012-11-11 – 2012-11-12 (×3): 0.5 mg via INTRAVENOUS
  Filled 2012-11-10 (×3): qty 1

## 2012-11-10 MED ORDER — FENTANYL 25 MCG/HR TD PT72
25.0000 ug | MEDICATED_PATCH | TRANSDERMAL | Status: DC
  Administered 2012-11-10: 25 ug via TRANSDERMAL
  Filled 2012-11-10: qty 1

## 2012-11-10 MED ORDER — PNEUMOCOCCAL VAC POLYVALENT 25 MCG/0.5ML IJ INJ
0.5000 mL | INJECTION | INTRAMUSCULAR | Status: AC
  Administered 2012-11-11: 0.5 mL via INTRAMUSCULAR
  Filled 2012-11-10 (×2): qty 0.5

## 2012-11-10 MED ORDER — ONDANSETRON HCL 4 MG/2ML IJ SOLN
4.0000 mg | INTRAMUSCULAR | Status: DC | PRN
  Administered 2012-11-10 – 2012-11-11 (×4): 4 mg via INTRAVENOUS
  Filled 2012-11-10 (×5): qty 2

## 2012-11-10 MED ORDER — DIPHENOXYLATE-ATROPINE 2.5-0.025 MG PO TABS: 2.0000 | ORAL_TABLET | Freq: Four times a day (QID) | ORAL | Status: DC | PRN

## 2012-11-10 MED ORDER — SODIUM CHLORIDE 0.9 % IV SOLN
INTRAVENOUS | Status: DC
  Administered 2012-11-10: 18:00:00 via INTRAVENOUS

## 2012-11-10 MED ORDER — ACETAMINOPHEN 325 MG PO TABS: 650.0000 mg | ORAL_TABLET | Freq: Four times a day (QID) | ORAL | Status: DC | PRN

## 2012-11-10 MED ORDER — SORBITOL 70 % SOLN: 30.0000 mL | Freq: Every day | Status: DC | PRN

## 2012-11-10 MED ORDER — SENNOSIDES-DOCUSATE SODIUM 8.6-50 MG PO TABS
1.0000 | ORAL_TABLET | Freq: Every day | ORAL | Status: DC
  Filled 2012-11-10 (×3): qty 1

## 2012-11-10 MED ORDER — ACETAMINOPHEN 650 MG RE SUPP: 650.0000 mg | Freq: Four times a day (QID) | RECTAL | Status: DC | PRN

## 2012-11-10 MED ORDER — PROCHLORPERAZINE EDISYLATE 5 MG/ML IJ SOLN
10.0000 mg | Freq: Three times a day (TID) | INTRAMUSCULAR | Status: DC
  Administered 2012-11-10 – 2012-11-12 (×4): 10 mg via INTRAVENOUS
  Filled 2012-11-10 (×6): qty 2

## 2012-11-10 MED ORDER — IOHEXOL 300 MG/ML  SOLN
50.0000 mL | Freq: Once | INTRAMUSCULAR | Status: AC | PRN
  Administered 2012-11-10: 50 mL via ORAL

## 2012-11-10 MED ORDER — DEXTROSE 5 % IV SOLN: 1.0000 g | INTRAVENOUS | Status: DC

## 2012-11-10 MED ORDER — ONDANSETRON HCL 4 MG PO TABS: 4.0000 mg | ORAL_TABLET | ORAL | Status: DC | PRN

## 2012-11-10 MED ORDER — FLEET ENEMA 7-19 GM/118ML RE ENEM: 1.0000 | ENEMA | Freq: Once | RECTAL | Status: AC | PRN

## 2012-11-10 MED ORDER — OXYCODONE HCL ER 15 MG PO T12A
30.0000 mg | EXTENDED_RELEASE_TABLET | Freq: Two times a day (BID) | ORAL | Status: DC
  Administered 2012-11-10 – 2012-11-13 (×6): 30 mg via ORAL
  Filled 2012-11-10 (×6): qty 2

## 2012-11-10 MED ORDER — ONDANSETRON HCL 4 MG/2ML IJ SOLN
4.0000 mg | Freq: Once | INTRAMUSCULAR | Status: AC
  Administered 2012-11-10: 4 mg via INTRAVENOUS
  Filled 2012-11-10: qty 2

## 2012-11-10 MED ORDER — IOHEXOL 300 MG/ML  SOLN
100.0000 mL | Freq: Once | INTRAMUSCULAR | Status: AC | PRN
  Administered 2012-11-10: 100 mL via INTRAVENOUS

## 2012-11-10 MED ORDER — LORAZEPAM 0.5 MG PO TABS: 0.5000 mg | ORAL_TABLET | Freq: Three times a day (TID) | ORAL | Status: DC | PRN

## 2012-11-10 MED ORDER — LEVOFLOXACIN IN D5W 500 MG/100ML IV SOLN
500.0000 mg | INTRAVENOUS | Status: DC
  Administered 2012-11-10 – 2012-11-12 (×3): 500 mg via INTRAVENOUS
  Filled 2012-11-10 (×3): qty 100

## 2012-11-10 MED ORDER — MORPHINE SULFATE 4 MG/ML IJ SOLN
4.0000 mg | INTRAMUSCULAR | Status: DC | PRN
  Administered 2012-11-10 – 2012-11-12 (×4): 4 mg via INTRAVENOUS
  Filled 2012-11-10 (×4): qty 1

## 2012-11-10 MED ORDER — PANTOPRAZOLE SODIUM 40 MG IV SOLR
40.0000 mg | INTRAVENOUS | Status: DC
  Administered 2012-11-10 – 2012-11-11 (×2): 40 mg via INTRAVENOUS
  Filled 2012-11-10 (×3): qty 40

## 2012-11-10 MED ORDER — LORAZEPAM 2 MG/ML IJ SOLN
1.0000 mg | Freq: Once | INTRAMUSCULAR | Status: AC
  Administered 2012-11-10: 1 mg via INTRAVENOUS
  Filled 2012-11-10: qty 1

## 2012-11-10 MED ORDER — SODIUM CHLORIDE 0.9 % IV BOLUS (SEPSIS)
1000.0000 mL | Freq: Once | INTRAVENOUS | Status: AC
  Administered 2012-11-10: 1000 mL via INTRAVENOUS

## 2012-11-10 MED ORDER — ALUM & MAG HYDROXIDE-SIMETH 200-200-20 MG/5ML PO SUSP: 30.0000 mL | Freq: Four times a day (QID) | ORAL | Status: DC | PRN

## 2012-11-10 MED ORDER — LEVALBUTEROL HCL 0.63 MG/3ML IN NEBU: 0.6300 mg | INHALATION_SOLUTION | RESPIRATORY_TRACT | Status: DC | PRN

## 2012-11-10 MED ORDER — SODIUM CHLORIDE 0.9 % IV SOLN
1000.0000 mL | INTRAVENOUS | Status: DC
  Administered 2012-11-10 – 2012-11-13 (×7): 1000 mL via INTRAVENOUS

## 2012-11-10 NOTE — ED Notes (Signed)
WUJ:[id number]<[id number]> Expected date:<BR> Expected time:<BR> Means of arrival:<BR> Comments:<BR> Stage 4 colon cancer/ NVD/ HR 144

## 2012-11-10 NOTE — ED Notes (Signed)
Chaplain paged due to patient dealing with the fact that her stage four cancer will eventually result in death. Very tearful.

## 2012-11-10 NOTE — H&P (Signed)
[facility] History and Physical  [name] JXB:[id number] DOB: 06/18/1975 DOA: 11/10/2012  Referring physician: Dr. Patria Mane PCP: Wilmer Floor., MD  Specialists: Oncology: Dr. Clelia Croft  Chief Complaint: Nausea/ vomiting/ abdominal pain  HPI: [name] is a 37 y.o. 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 Patient states is on chronic pain medication and awaiting for consideration for surgery at [facility] however has not been fully evaluated secondary to insurance issues. Patient's pain had been getting worse to the point that she was unable to undergo radiation therapy and a such her pain regimen was adjusted. Patient stated OxyContin was changed from every 12 hours to every 8 hours. Patient is also on a fentanyl patch and oxycodone for breakthrough pain. Patient stated that to further adjustment in the OxyContin is leading to have a decreased appetite as well as nausea and vomiting. Patient denies any hematemesis, no hematochezia, no melena. Patient does endorse fevers and chills as stated had a temperature 100.3. Patient denies any chest pain or shortness of breath. Patient denies any diarrhea. Patient does endorse some constipation, generalized weakness, suprapubic pain. Patient denies any dysuria. Patient states her pain has been worsening. Patient due to nausea ,vomiting, abdominal pain as in has been unable to keep anything down including her pain medications. Patient was seen in the ED urinalysis done was nitrite negative small leukocytes with 3-6 WBCs. Urine pregnancy was negative. Compressive metabolic profile had a sodium of 134 chloride of  90 glucose of 158 alk phosphatase of 118 albumin of 3.4 otherwise was within normal limits. Lipase was 13. CBC had a white count of 16.2 a hemoglobin of 13.3 otherwise was within normal limits. Chest x-ray which was done was negative for any acute infiltrate. CT of the abdomen and pelvis showed interval progression of large pelvic mass and hepatic metastases. WE were consulted to admit the patient for further evaluation and management.  Review of Systems: The patient denies anorexia, fever, weight loss,, vision loss, decreased hearing, hoarseness, chest pain, syncope, dyspnea on exertion, peripheral edema, balance deficits, hemoptysis, abdominal pain, melena, hematochezia, severe indigestion/heartburn, hematuria, incontinence, genital sores, muscle weakness, suspicious skin lesions, transient blindness, difficulty walking, depression, unusual weight change, abnormal bleeding, enlarged lymph nodes, angioedema, and breast masses.   Past Medical History  Diagnosis Date  . Asthma   . Gallstones   . Diverticulosis of colon (without mention of hemorrhage)   . Esophageal reflux   . Urinary tract infection   . Depression   . Anxiety   . Chronic headaches   . 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 Allergy   . Hyperthyroidism     takes Propanolol  . Seizures     last one at age 65  . colon ca dx'd 07/26/10    surg; chemo to begin  . Grave's disease   . Hypertension   . Asthma   . Colon cancer 07/25/10    sigmoid   Past Surgical History  Procedure Laterality Date  . Dilation and curettage of uterus    . Johnsten drain    . Orif acetabular fracture    . Metal plate  1308  Right hand  . Colon surgery  08/05/10    lap assisted sigmoid colectomy  . Portacath placement    . Hand surgery      Right hand surgery   Social History:  reports that she has been smoking Cigarettes.  She has a 20 pack-year smoking history. She has never used smokeless tobacco. She reports that she does not  drink alcohol or use illicit drugs.  Allergies  Allergen Reactions  . Codeine Hives  . Penicillins Hives  . Sulfa Antibiotics Hives    Family History  Problem Relation Age of Onset  . COPD Father   . Diabetes Father   . Colon cancer Father   . Colon polyps Father   . Kidney disease Mother   . Cervical cancer Mother   . Breast cancer Maternal Grandmother   . Lung cancer Brother   . Asthma Sister   . Colon cancer Paternal Grandfather     Prior to Admission medications   Medication Sig Start Date End Date Taking? Authorizing Provider  albuterol (PROAIR HFA) 108 (90 BASE) MCG/ACT inhaler Inhale 2 puffs into the lungs every 4 (four) hours as needed for wheezing or shortness of breath.    Yes Historical Provider, MD  dexamethasone (DECADRON) 4 MG tablet Take 8 mg by mouth as directed. Taken on first 3 days post-chemo.   Yes Historical Provider, MD  diphenoxylate-atropine (LOMOTIL) 2.5-0.025 MG per tablet Take 2 tablets by mouth 4 (four) times daily as needed for diarrhea or loose stools. 08/14/12  Yes Myrtis Ser, NP  Docusate Sodium (STOOL SOFTENER) 100 MG capsule Take 100-200 mg by mouth 2 (two) times daily as needed for constipation.    Yes Historical Provider, MD  fentaNYL (DURAGESIC - DOSED MCG/HR) 25 MCG/HR Place 1 patch (25 mcg total) onto the skin every 3 (three) days. 10/25/12  Yes Benjiman Core, MD  lidocaine-prilocaine (EMLA) cream Apply 1 application topically as needed (for port-a-cath access).   Yes Historical Provider, MD  LORazepam (ATIVAN) 0.5 MG tablet Take 0.5 mg by mouth every 8 (eight) hours as needed (for nausea).   Yes Historical Provider, MD  ondansetron (ZOFRAN) 8 MG tablet Take 8 mg by mouth every 12 (twelve) hours as needed for nausea.   Yes Historical Provider, MD  oxyCODONE (OXY IR/ROXICODONE) 5 MG immediate release tablet Take 1-2 tablets (5-10 mg total) by mouth every 6 (six) hours as needed for pain. 11/04/12  Yes Myrtis Ser, NP  oxyCODONE  (OXYCONTIN) 20 MG 12 hr tablet Take one tablet every 8 hours. 10/30/12  Yes Benjiman Core, MD  polyethylene glycol (MIRALAX / GLYCOLAX) packet Take 17 g by mouth daily as needed (for constipation).   Yes Historical Provider, MD  propranolol (INDERAL) 40 MG tablet Take 40 mg by mouth daily as needed (for hypertension).    Yes Historical Provider, MD   Physical Exam: Filed Vitals:   11/10/12 1435  BP: 123/68  Pulse: 110  Temp:   Resp: 10     General:  Cachectic chronically ill appearing female in no acute cardiopulmonary distress.  Eyes: Exophthalmos. Pupils equal round reactive to light and accommodation. Extraocular movements intact.  ENT: Oropharynx is clear, no lesions, no exudates. Dry mucous membranes.  Neck: Supple with no lymphadenopathy. No JVD.  Cardiovascular: Tachycardic, no murmurs rubs or gallops  Respiratory:  Clear to auscultation bilaterally.  Abdomen: Mildly distended, hard, diffuse tenderness to palpation, positive bowel sounds  Skin: no rashes or lesions.  Musculoskeletal: 4/5  BUE strength, 4/5 BLE strength  Psychiatric: Depressed mood. Normal affect. Good insight. Good judgment.  Neurologic: alert and oriented x3. Cranial nerves II through XII are grossly intact. No focal deficits. Gait not tested secondary to safety.  Labs on Admission:  Basic Metabolic Panel:  Recent Labs Lab 11/10/12 1255  NA 134*  K 3.6  CL 90*  CO2 29  GLUCOSE 158*  BUN 15  CREATININE 0.62  CALCIUM 10.1   Liver Function Tests:  Recent Labs Lab 11/10/12 1255  AST 18  ALT 12  ALKPHOS 118*  BILITOT 0.7  PROT 7.4  ALBUMIN 3.4*    Recent Labs Lab 11/10/12 1255  LIPASE 13   No results found for this basename: AMMONIA,  in the last 168 hours CBC:  Recent Labs Lab 11/10/12 1255  WBC 16.2*  HGB 13.3  HCT 40.1  MCV 78.2  PLT 284   Cardiac Enzymes: No results found for this basename: CKTOTAL, CKMB, CKMBINDEX, TROPONINI,  in the last 168 hours  BNP (last  3 results) No results found for this basename: PROBNP,  in the last 8760 hours CBG: No results found for this basename: GLUCAP,  in the last 168 hours  Radiological Exams on Admission: Dg Chest 2 View  11/10/2012   *RADIOLOGY REPORT*  Clinical Data: Short of breath.  Chest pain.  Abdominal pain.  CHEST - 2 VIEW  Comparison: 03/06/2012  Findings: Lungs are hyperaerated.  Chronic changes at the right lung base.  Left subclavian vein Port-A-Cath is stable.  Normal heart size.  No pneumothorax.  No pleural effusion.  IMPRESSION: No active cardiopulmonary disease.   Original Report Authenticated By: Jolaine Click, M.D.   Ct Abdomen Pelvis W Contrast  11/10/2012   *RADIOLOGY REPORT*  Clinical Data: Colon cancer, post sigmoid colectomy, chemotherapy, radiation. Abdominal pain and weight loss.  CT ABDOMEN AND PELVIS WITH CONTRAST  Technique:  Multidetector CT imaging of the abdomen and pelvis was performed following the standard protocol during bolus administration of intravenous contrast.  Contrast: OMNIPAQUE IOHEXOL 300 MG/ML  SOLN  Comparison: 06/20/2012  Findings: The visualized portions of lung bases are grossly unremarkable, imaging degraded by patient motion. 2.1 cm lesion in the anterior right hepatic lobe, previously 17 mm. No new liver lesions are evident.  There is focal fatty infiltration adjacent the falciform ligament.  Vascular clips in the gallbladder fossa.  Significant interval enlargement of a large mixed attenuation pelvic mass measuring approximately 21.8 cm maximum transverse diameter (previously 15.4 cm by my measurement), incompletely visualized distally because of patient motion now extending cephalad from the pelvis to the level of the lower pole left kidney.  The stomach is nondilated.  There are several gas and fluid distended small bowel loops in the left mid abdomen.  Colon is nondilated.  Assessment of the lower pelvis significantly degraded by motion.  No definite adenopathy  localized.  Kidneys, adrenal glands, spleen unremarkable.  Mild pancreatic parenchymal atrophy.  No definite ascites.  No free air.  Coronal and sagittal reconstructions are significantly degraded due to the motion during the primary acquisition, and by report from technologist the patient refused rescanning.  IMPRESSION:  1.  Interval progression of large pelvic mass and hepatic metastasis.   Original Report Authenticated By: D. Andria Rhein, MD    EKG: not done  Assessment/Plan Principal Problem:   Nausea and vomiting in adult patient Active Problems:   HYPERTHYROIDISM   TOBACCO USER   ALLERGIC RHINITIS   Colonic mass  Colon cancer   Abdominal pain   Dehydration   Leukocytosis   Constipation   Tachycardia   UTI (lower urinary tract infection)   #1 nausea/ vomiting /abdominal pain Likely secondary to worsening large pelvic mass and hepatic metastases versus secondary to possible urinary tract infection versus secondary to possible narcotic withdrawal as patient has been unable to keep medications down for the past 3 days versus constipation. We'll admit patient to MedSurg floor. Hydrate with IV fluids. Anti-emetics. Pain management. Supportive care. Will check a KUB in the morning. Place on clear liquids. We'll place empirically on IV Rocephin for probable UTI. Follow.  #2 possible urinary tract infection Patient is presenting with worsening suprapubic pain, leukocytosis and urinalysis with small leukocytes and 3-6 white blood cells. Will check urine cultures. Placed empirically on IV Rocephin. Follow.  #3 dehydration Secondary to GI losses. IV fluids.  #4 constipation Likely secondary to narcotics. Will place patient on a bowel regimen. Will give patient a tap water enema x1. Follow.  #5 stage IV adenocarcinoma of the colon with metastases to the liver ovaries and possibly lung Will inform oncology of patient's admission tomorrow. Dr. Kizzie Ide that is her oncologist.  #6  tachycardia Likely secondary to dehydration and hyperthyroidism/Graves' disease as patient has been unable to take her propranolol. Will check a EKG. IV fluids. Resume home dose propranolol. Follow.  #7 tobacco abuse Tobacco cessation. Placed on a nicotine patch.  #8 chronic abdominal pain Likely secondary to stage IV adenocarcinoma of the colon with metastases. Will place patient on OxyContin 30 mg twice daily. Take home dose oxycodone 5-10 mg every 4 hours as needed for breakthrough pain. Continue fentanyl patch. We'll consult the palliative care for pain management.  #9 leukocytosis Likely secondary to possible urinary tract infection. Chest x-ray is negative for any acute infiltrate. CT of the abdomen and pelvis is negative. Will check urine cultures. Check blood cultures x2. Will place empirically on IV Rocephin. Follow.  #10 prophylaxis PPI for GI prophylaxis. Lovenox for DVT prophylaxis.  #10 prognosis Patient has have a poor prognosis with a stage IV adenocarcinoma of the colon with metastatic disease to the liver ovaries and the lungs. Will consult with oncology tomorrow. Will also consult the palliative care for goals of care.   Code Status: Full Family Communication: Updated patient no family at bedside. Disposition Plan: Admit to medsurg  Time spent: 69 MINS  [facility] Pager [phone]   If 7PM-7AM, please contact night-coverage [email or web address] Password [facility] 11/10/2012, 4:13 PM

## 2012-11-10 NOTE — Progress Notes (Signed)
Tap water enema not given. Pt had 2 bowel movements upon arrival to floor. Pt refused enema. Angelena Form, RN

## 2012-11-10 NOTE — ED Notes (Signed)
Patient has no appearance of respiratory distress

## 2012-11-10 NOTE — ED Notes (Signed)
Patient complaining of nausea. Unable to drink contrast. Ativan given with positive result

## 2012-11-10 NOTE — ED Provider Notes (Signed)
CSN: [id number]     Arrival date & time 11/10/12  1213 History     First MD Initiated Contact with Patient 11/10/12 1217     Chief Complaint  Patient presents with  . Nausea  . Emesis   The history is provided by the patient.   Patient reports worsening abdominal pain as well as nausea vomiting or the past 72 hours.  The patient has a history of stage IV metastatic colon cancer.  She's currently receiving palliative radiation to a lower pelvic mass.  She has pain medications at home but reports worsening pain over the past several days and inability to tolerate her oral pain medications.  She denies fever and chills.  No urinary symptoms.  No back pain.  Her symptoms are moderate to severe in severity.  Nothing worsens or improves her symptoms   Past Medical History  Diagnosis Date  . Asthma   . Gallstones   . Diverticulosis of colon (without mention of hemorrhage)   . Esophageal reflux   . Urinary tract infection   . Depression   . Anxiety   . Chronic headaches   . 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 Allergy   . Hyperthyroidism     takes Propanolol  . Seizures     last one at age 64  . colon ca dx'd 07/26/10    surg; chemo to begin  . Grave's disease   . Hypertension   . Asthma   . Colon cancer 07/25/10    sigmoid   Past Surgical History  Procedure Laterality Date  . Dilation and curettage of uterus    . Johnsten drain    . Orif acetabular fracture    . Metal plate  4098    Right hand  . Colon surgery  08/05/10    lap assisted sigmoid colectomy  . Portacath placement    . Hand surgery      Right hand surgery   Family History  Problem Relation Age of Onset  . COPD Father   . Diabetes Father   . Colon cancer Father   . Colon polyps Father   . Kidney disease Mother   . Cervical cancer Mother   . Breast cancer Maternal Grandmother   . Lung cancer Brother   . Asthma Sister   . Colon cancer Paternal Grandfather    History  Substance Use Topics   . Smoking status: Current Every Day Smoker -- 1.00 packs/day for 20 years    Types: Cigarettes  . Smokeless tobacco: Never Used     Comment: 10/23/12 smoking 1/2 PPD  . Alcohol Use: No   OB History   Grav Para Term Preterm Abortions TAB SAB Ect Mult Living   15 5 3 2 10 1 9  1 3      Review of Systems  All other systems reviewed and are negative.    Allergies  Codeine; Penicillins; and Sulfa antibiotics  Home Medications   Current Outpatient Rx  Name  Route  Sig  Dispense  Refill  . albuterol (PROAIR HFA) 108 (90 BASE) MCG/ACT inhaler   Inhalation   Inhale 2 puffs into the lungs every 4 (four) hours as needed for wheezing or shortness of breath.          . dexamethasone (DECADRON) 4 MG tablet   Oral   Take 8 mg by mouth as directed. Taken on first 3 days post-chemo.         Marland Kitchen  diphenoxylate-atropine (LOMOTIL) 2.5-0.025 MG per tablet   Oral   Take 2 tablets by mouth 4 (four) times daily as needed for diarrhea or loose stools.   60 tablet   1   . Docusate Sodium (STOOL SOFTENER) 100 MG capsule   Oral   Take 100-200 mg by mouth 2 (two) times daily as needed for constipation.          . fentaNYL (DURAGESIC - DOSED MCG/HR) 25 MCG/HR   Transdermal   Place 1 patch (25 mcg total) onto the skin every 3 (three) days.   10 patch   0   . lidocaine-prilocaine (EMLA) cream   Topical   Apply 1 application topically as needed (for port-a-cath access).         . LORazepam (ATIVAN) 0.5 MG tablet   Oral   Take 0.5 mg by mouth every 8 (eight) hours as needed (for nausea).         . ondansetron (ZOFRAN) 8 MG tablet   Oral   Take 8 mg by mouth every 12 (twelve) hours as needed for nausea.         Marland Kitchen oxyCODONE (OXY IR/ROXICODONE) 5 MG immediate release tablet   Oral   Take 1-2 tablets (5-10 mg total) by mouth every 6 (six) hours as needed for pain.   60 tablet   0   . oxyCODONE (OXYCONTIN) 20 MG 12 hr tablet      Take one tablet every 8 hours.   90 tablet   0    . polyethylene glycol (MIRALAX / GLYCOLAX) packet   Oral   Take 17 g by mouth daily as needed (for constipation).         . propranolol (INDERAL) 40 MG tablet   Oral   Take 40 mg by mouth daily as needed (for hypertension).           BP 122/76  Pulse 120  Temp(Src) 97.8 F (36.6 C) (Oral)  Resp 20  SpO2 98% Physical Exam  Nursing note and vitals reviewed. Constitutional: She is oriented to person, place, and time. She appears well-developed.  Cachectic  HENT:  Head: Normocephalic and atraumatic.  Eyes: EOM are normal.  Neck: Normal range of motion.  Cardiovascular: Regular rhythm and normal heart sounds.   Tachycardic  Pulmonary/Chest: Effort normal and breath sounds normal.  Abdominal: Soft.  Large palpable abdominal mass likely consistent with metastatic disease.  Mild to moderate tenderness diffusely throughout abdomen.  No peritoneal sign  Musculoskeletal: Normal range of motion.  Neurological: She is alert and oriented to person, place, and time.  Skin: Skin is warm and dry.  Psychiatric: She has a normal mood and affect. Judgment normal.    ED Course   Procedures (including critical care time)  Labs Reviewed  CBC - Abnormal; Notable for the following:    WBC 16.2 (*)    RBC 5.13 (*)    MCH 25.9 (*)    All other components within normal limits  COMPREHENSIVE METABOLIC PANEL - Abnormal; Notable for the following:    Sodium 134 (*)    Chloride 90 (*)    Glucose, Bld 158 (*)    Albumin 3.4 (*)    Alkaline Phosphatase 118 (*)    All other components within normal limits  URINALYSIS, ROUTINE W REFLEX MICROSCOPIC - Abnormal; Notable for the following:    Color, Urine AMBER (*)    Bilirubin Urine SMALL (*)    Ketones, ur 40 (*)  Protein, ur 30 (*)    Leukocytes, UA SMALL (*)    All other components within normal limits  URINE MICROSCOPIC-ADD ON - Abnormal; Notable for the following:    Crystals CA OXALATE CRYSTALS (*)    All other components within  normal limits  LIPASE, BLOOD  PREGNANCY, URINE   No results found. No diagnosis found.  MDM  Patient with severe nausea vomiting and evidence of dehydration.  Some of this may be secondary to inability to tolerate her narcotic medications over the past several days.  This could also represent worsening of her metastatic colon cancer.  She looks cachectic and states she continues to lose weight.  2:59 PM Patient continues with abdominal pain and ongoing nausea at this time.  Given her what appears to be progression of cancer the patient be admitted for observation her symptomatically min.  Oncology will need to see the patient tomorrow.  Palliative consultation may be extremely beneficial in this patient.  She does have some dilated loops of small bowel, may represent ileus  Lyanne Co, MD 11/10/12 1500

## 2012-11-10 NOTE — ED Notes (Signed)
Per EMS patient has a stage 4 cancer. She c/o n/v/ with chronic back pain and hand cramping. 122/83, 145hr, 100%, rr 34. Zofran 4mg  given. Also having SOB but o2 saturation 100% on ra.

## 2012-11-11 ENCOUNTER — Inpatient Hospital Stay (HOSPITAL_COMMUNITY): Payer: Medicare Other

## 2012-11-11 ENCOUNTER — Ambulatory Visit: Payer: Self-pay

## 2012-11-11 DIAGNOSIS — C7960 Secondary malignant neoplasm of unspecified ovary: Secondary | ICD-10-CM

## 2012-11-11 DIAGNOSIS — R109 Unspecified abdominal pain: Secondary | ICD-10-CM

## 2012-11-11 DIAGNOSIS — R627 Adult failure to thrive: Secondary | ICD-10-CM

## 2012-11-11 DIAGNOSIS — C187 Malignant neoplasm of sigmoid colon: Secondary | ICD-10-CM

## 2012-11-11 DIAGNOSIS — E43 Unspecified severe protein-calorie malnutrition: Secondary | ICD-10-CM | POA: Diagnosis present

## 2012-11-11 DIAGNOSIS — Z515 Encounter for palliative care: Secondary | ICD-10-CM

## 2012-11-11 LAB — TSH: TSH: <0.008 u[IU]/mL — ABNORMAL LOW (ref 0.350–4.500)

## 2012-11-11 LAB — COMPREHENSIVE METABOLIC PANEL
ALT: 11 U/L (ref 0–35)
BUN: 8 mg/dL (ref 6–23)
CO2: 26 mEq/L (ref 19–32)
Calcium: 9.1 mg/dL (ref 8.4–10.5)
GFR calc Af Amer: >90 mL/min (ref >90)
GFR calc non Af Amer: >90 mL/min (ref >90)
Glucose, Bld: 101 mg/dL — ABNORMAL HIGH (ref 70–99)
Sodium: 135 mEq/L (ref 135–145)

## 2012-11-11 LAB — CBC
Hemoglobin: 10.8 g/dL — ABNORMAL LOW (ref 12.0–15.0)
MCHC: 32.2 g/dL (ref 30.0–36.0)
RDW: 14.8 % (ref 11.5–15.5)
WBC: 11.2 10*3/uL — ABNORMAL HIGH (ref 4.0–10.5)

## 2012-11-11 MED ORDER — OXYBUTYNIN CHLORIDE 5 MG PO TABS
5.0000 mg | ORAL_TABLET | Freq: Three times a day (TID) | ORAL | Status: DC | PRN
  Administered 2012-11-11: 5 mg via ORAL
  Filled 2012-11-11: qty 1

## 2012-11-11 MED ORDER — OXYBUTYNIN CHLORIDE 5 MG PO TABS
5.0000 mg | ORAL_TABLET | Freq: Once | ORAL | Status: AC
  Administered 2012-11-11: 5 mg via ORAL
  Filled 2012-11-11: qty 1

## 2012-11-11 MED ORDER — ENSURE COMPLETE PO LIQD
237.0000 mL | Freq: Two times a day (BID) | ORAL | Status: DC
  Administered 2012-11-11 – 2012-11-13 (×4): 237 mL via ORAL

## 2012-11-11 MED ORDER — DIPHENHYDRAMINE HCL 25 MG PO CAPS
25.0000 mg | ORAL_CAPSULE | Freq: Four times a day (QID) | ORAL | Status: DC | PRN
  Administered 2012-11-11: 25 mg via ORAL
  Filled 2012-11-11: qty 1

## 2012-11-11 MED ORDER — IOHEXOL 300 MG/ML  SOLN
10.0000 mL | Freq: Once | INTRAMUSCULAR | Status: AC | PRN
  Administered 2012-11-11: 10 mL via INTRAVENOUS

## 2012-11-11 NOTE — Care Management (Signed)
CARE MANAGEMENT NOTE 11/11/2012  Patient:  [name], [name]   Account Number:  [id number]  Date Initiated:  11/11/2012  Documentation initiated by:  Jensyn Shave  Subjective/Objective Assessment:   37 yo female admitted with UTI & Abdominal Pain. Hx of adenocarcinoma of the colon with metastasis to the liver, both ovaries, and possibly lung.     Action/Plan:   GOC meeting scheduled   Anticipated DC Date:     Anticipated DC Plan:        DC Planning Services  CM consult      Choice offered to / List presented to:     DME arranged  NA      DME agency  NA     HH arranged  NA      Status of service:  In process, will continue to follow Medicare Important Message given?   (If response is "NO", the following Medicare IM given date fields will be blank) Date Medicare IM given:   Date Additional Medicare IM given:    Discharge Disposition:    Per UR Regulation:  Reviewed for med. necessity/level of care/duration of stay  If discussed at Long Length of Stay Meetings, dates discussed:    Comments:  11/11/12 1455 Horrace Hanak,RN,BSN [phone] Awaiting GOC meeting for further dc planning.

## 2012-11-11 NOTE — Progress Notes (Signed)
PT Cancellation Note  Patient Details Name: ZARIANNA DICARLO MRN: [id number] DOB: Aug 12, 1975   Cancelled Treatment:    Reason Eval/Treat Not Completed: PT screened, no needs identified, will sign off Per pt and RN, pt is getting up on her own and does not need PT Will not evaluate patient as she will continue ad lib mobility with nursing supervision   Donnetta Hail 11/11/2012, 2:55 PM

## 2012-11-11 NOTE — Progress Notes (Signed)
Palliative Medicine Team SW Referred for psychosocial support after GOC mtg with PMT NP Lorinda Creed. Met with pt and sister [name] who gave extensive hx of multiple losses over the past few years, and various issues involving family deaths,  addiction and financial hardship. Pt tearful about poor prognosis and her struggle with how to communicate this with her young children ages 24, 76, & 91. Sister [name] is a big support to pt and is helping her to outline next few steps and considerations about plan of care and settings for that care. Appreciate Chaplain's visit toward end of our discussion as pt also has need for spiritual support and advance directive assistance. Will collaborate with spiritual care for support and follow up tomorrow for continued discussion.   Joanne Francois, LCSW PMT Phone [phone] Pager [phone]

## 2012-11-11 NOTE — Progress Notes (Signed)
[facility] PROGRESS NOTE  [name] QMV:[id number] DOB: [birth year] DOA: 11/10/2012 PCP: Wilmer Floor., MD  Brief narrative: 37 y.o. 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 Patient stated she is on chronic pain medications and awaiting for consideration for surgery at [facility] but has not been fully evaluated secondary to insurance issues.  In the ED urinalysis positive for nitrite, small leukocytes with 3-6 WBCs. Urine pregnancy negative. CMP relatively unremarkable and CBC with white count of 16.2.  CT of the abdomen and pelvis showed interval progression of large pelvic mass and hepatic metastases. [facility] asked to admit the patient for further evaluation and management.  Assessment/Plan:  Principal Problem:   Nausea and vomiting, abdomina pain  - no significant clinical improvement - possibly related to UTI, urine culture pending - will continue Levaquin day #2/5, continue supportive care with analgesia and antiemetics as needed - advance diet as pt able to tolerate - appreciate oncology assistance  Active Problems:   HYPERTHYROIDISM - will check TSH   Colon cancer - appreciate oncology assistance - palliative care consultation for goal of care recommended   Leukocytosis - possible related to progressive malignancy and ? New UTI - continue ABX as noted above - CBC in AM   Constipation - Monitor bowel regimen     UTI (lower urinary tract infection) - follow up on urine culture and continue empiric ABX for now  Code Status: Full Family  Communication: Pt at bedside  Disposition Plan: Not medically ready   Manson Passey, MD  [facility] Pager [phone]  If 7PM-7AM, please contact night-coverage [email or web address] Password [email or web address] 11/11/2012, 7:45 AM   LOS: 1 day   Consultants:  Oncologist   Studies:  Dg Chest 2 View  11/10/2012  No active cardiopulmonary disease.    Ct Abdomen Pelvis W Contrast  11/10/2012   Interval progression of large pelvic mass and hepatic metastasis.    Antibiotics:  Levaquin 8/10 -->  HPI/Subjective: Pt denies chest pain or shortness of breath this AM.   Objective: Filed Vitals:   11/10/12 1625 11/10/12 1715 11/10/12 2048 11/11/12 0507  BP: 127/73 140/76 119/65 106/60  Pulse: 115 118 113 95  Temp: 98 F (36.7 C) 97.6 F (36.4 C) 98.7 F (37.1 C) 98.1 F (36.7 C)  TempSrc: Oral Oral Oral Oral  Resp: 18 20 18 18   Height:  5\' 7"  (1.702 m)    Weight:  507.394 kg (1118 lb 9.6 oz)    SpO2: 100% 100% 99% 100%    Intake/Output Summary (Last 24 hours) at 11/11/12 0745 Last data filed at 11/11/12 4010  Gross per 24 hour  Intake    120 ml  Output    500 ml  Net   -380 ml    Exam:   General:  Pt is alert, follows commands appropriately, not in acute distress, cachectic   Cardiovascular: Regular rate and rhythm, S1/S2, no murmurs, no rubs, no gallops  Respiratory: Clear to auscultation bilaterally, no wheezing, decreased breath sounds at bases   Abdomen: Soft, slightly tender in epigastric area, distended, bowel sounds present, no guarding  Extremities: No edema, pulses DP and PT  palpable bilaterally  Neuro: Grossly nonfocal  Data Reviewed: Basic Metabolic Panel:  Recent Labs Lab 11/10/12 1255 11/10/12 1300  NA 134*  --   K 3.6  --   CL 90*  --   CO2 29  --   GLUCOSE 158*  --   BUN 15  --   CREATININE 0.62  --   CALCIUM 10.1  --   MG  --  1.8   Liver Function Tests:  Recent Labs Lab 11/10/12 1255  AST 18  ALT 12  ALKPHOS 118*  BILITOT 0.7  PROT 7.4   ALBUMIN 3.4*    Recent Labs Lab 11/10/12 1255  LIPASE 13   CBC:  Recent Labs Lab 11/10/12 1255  WBC 16.2*  HGB 13.3  HCT 40.1  MCV 78.2  PLT 284   Scheduled Meds: . docusate sodium  100 mg Oral BID  . enoxaparin  injection  40 mg Subcutaneous Q24H  . fentaNYL  25 mcg Transdermal Q72H  . levofloxacin IV  500 mg Intravenous Q24H  . nicotine  21 mg Transdermal Daily  . OxyCODONE  30 mg Oral Q12H  . pantoprazole IV  40 mg Intravenous Q24H  . polyethylene glycol  17 g Oral Daily  . prochlorperazine  10 mg Intravenous Q8H  . senna-docusate  1 tablet Oral QHS   Continuous Infusions: . sodium chloride 1,000 mL (11/11/12 0258)

## 2012-11-11 NOTE — Progress Notes (Signed)
Asked to assess left chest PAC - flushes easily with no resistence but no blood return, chest xray reviewed and tip PAC appears to be in heart, RN and Dr Clelia Croft aware.

## 2012-11-11 NOTE — Consult Note (Signed)
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 Rapid decline. Complex psychosocial situation   Consult is for review of medical treatment options, clarification of goals of care and end of life issues, disposition and options, and symptom recommendation.  This NP Lorinda Creed reviewed medical records, received report from team, assessed the patient and then meet at the patient's bedside along with her sister and main support Orlie Dakin # [phone] to discuss diagnosis prognosis, GOC, EOL wishes disposition and options.   A detailed discussion was had today regarding advanced directives.  Concepts specific to code status,  artificial hydration,  IV antibiotics and rehospitalization was had.  The difference between a aggressive medical intervention path  and a palliative comfort care path for this patient at this time was had.  Values and goals of care important to patient and family were attempted to be elicited.  Concept of Hospice and Palliative Care were discussed.  Natural trajectory and expectations at EOL were discussed.  Questions and concerns addressed.  Family encouraged to call with questions or concerns.  PMT will continue to support holistically.   Goals of Care: 1.  Code Status: Full code-discussed in detail futility of resuscitation attempt in this situation   2. Scope of Treatment:  Patient is hopeful for "more time".  Open to all available and offered medical interventions to prolong life.  Treat the treatable.  4. Disposition:  Long conversation reflected the  possibility that the patient and her three children may move in with her sister in [place], [place].   3. Symptom Management:   1. Anxiety/Agitation: Ativan 0.5 mg every 8 hr prn 2. Pain:          Oxycodone ER 30 mg every 12 hrs          Oxycodone IR 5-10 mg every 4 hrs prn          Morphine 4 mg IV every 4 hrs prn          Duragesic 25 mcg       3. Bowel Regimen: Sorbitol  4. Nausea/Vomiting: Zofran 408 mg IV every 4 hr  Discussed with the patient the importance of understanding the limited medical options, as it relates to  effective symptom management.  Will f/u in morning for further evaluation and adjustments.   4. Psychosocial:  Complex situation.  Patient has three children ages 43, 50, 27 whom she is solely responsible for.  Veronica Lett notified of need   5. 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 Rapid decline    ROS: weakness, nausea, abdominal pain    PMH:  Past Medical History  Diagnosis Date  . Asthma   . Gallstones   . Diverticulosis of colon (without mention of hemorrhage)   . Esophageal reflux   . Urinary tract infection   . Depression   . Anxiety   . Chronic  headaches   . 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 Allergy   . Hyperthyroidism     takes Propanolol  . Seizures     last one at age 76  . colon ca dx'd 07/26/10    surg; chemo to begin  . Grave's disease   . Hypertension   . Asthma   . Colon cancer 07/25/10    sigmoid     PSH: Past Surgical History  Procedure Laterality Date  . Dilation and curettage of uterus    . Johnsten drain    . Orif acetabular fracture    . Metal plate  9528    Right hand  . Colon surgery  08/05/10    lap assisted sigmoid colectomy  . Portacath placement    . Hand surgery      Right hand surgery   I have reviewed the FH and SH and  If  appropriate update it with new information. Allergies  Allergen Reactions  . Codeine Hives  . Penicillins Hives  . Sulfa Antibiotics Hives   Scheduled Meds: . docusate sodium  100 mg Oral BID  . enoxaparin (LOVENOX) injection  40 mg Subcutaneous Q24H  . feeding supplement  237 mL Oral BID BM  . fentaNYL  25 mcg Transdermal Q72H  . levofloxacin (LEVAQUIN) IV  500 mg Intravenous Q24H  . nicotine  21 mg Transdermal Daily  . OxyCODONE  30 mg Oral Q12H  . pantoprazole (PROTONIX) IV  40 mg Intravenous Q24H  . polyethylene glycol  17 g Oral Daily  . prochlorperazine  10 mg Intravenous Q8H  . senna-docusate  1 tablet Oral QHS   Continuous Infusions: . sodium chloride 1,000 mL (11/11/12 1145)   PRN Meds:.acetaminophen, acetaminophen, alum & mag hydroxide-simeth, diphenhydrAMINE, diphenoxylate-atropine, ipratropium, levalbuterol, LORazepam, morphine injection, ondansetron (ZOFRAN) IV, ondansetron, oxyCODONE, polyethylene glycol, propranolol, sorbitol    BP 107/62  Pulse 92  Temp(Src) 98.7 F (37.1 C) (Oral)  Resp 16  Ht 5\' 7"  (1.702 m)  Wt 53.978 kg (119 lb)  BMI 18.63 kg/m2  SpO2 95%   PPS: 40 %   Intake/Output Summary (Last 24 hours) at 11/11/12 1520 Last data filed at 11/11/12 1334  Gross per 24 hour  Intake   1150 ml  Output    575 ml  Net    575 ml    Physical Exam:  General: ill appearing, cachectic, lethargic HEENT:  Gross facil muscle atrophy,  Mm no exudate Chest:   diminished in bases, CTA CVS: RRR Abdomen: distended, minimal tenderness, +BS Ext:  Without edema Neuro:alert and oriented   Labs: CBC    Component Value Date/Time   WBC 11.2* 11/11/2012 0915   WBC 11.0* 10/30/2012 1151   RBC 4.16 11/11/2012 0915   RBC 4.78 10/30/2012 1151   HGB 10.8* 11/11/2012 0915   HGB 12.4 10/30/2012 1151   HCT 33.2* 11/11/2012 0915   HCT 37.7 10/30/2012 1151   PLT 209 11/11/2012 0915   PLT 292 10/30/2012 1151   MCV 79.8 11/11/2012 0915   MCV 78.9* 10/30/2012 1151   MCH  25.7* 11/11/2012 0915   MCH 25.9 10/30/2012 1151   MCHC 32.2 11/11/2012 0915   MCHC 32.9 10/30/2012 1151   RDW 14.8 11/11/2012 0915   RDW 14.8* 10/30/2012 1151   LYMPHSABS 1.4 10/30/2012 1151   LYMPHSABS 2.0 10/06/2012 1745   MONOABS 0.7 10/30/2012 1151   MONOABS 0.8 10/06/2012 1745   EOSABS 0.1 10/30/2012 1151  EOSABS 0.2 10/06/2012 1745   BASOSABS 0.1 10/30/2012 1151   BASOSABS 0.0 10/06/2012 1745    BMET    Component Value Date/Time   NA 135 11/11/2012 0915   NA 138 10/30/2012 1151   K 3.5 11/11/2012 0915   K 4.3 10/30/2012 1151   CL 100 11/11/2012 0915   CL 106 09/03/2012 0934   CO2 26 11/11/2012 0915   CO2 27 10/30/2012 1151   GLUCOSE 101* 11/11/2012 0915   GLUCOSE 142* 10/30/2012 1151   GLUCOSE 109* 09/03/2012 0934   BUN 8 11/11/2012 0915   BUN 9.7 10/30/2012 1151   CREATININE 0.65 11/11/2012 0915   CREATININE 0.7 10/30/2012 1151   CALCIUM 9.1 11/11/2012 0915   CALCIUM 10.3 10/30/2012 1151   GFRNONAA >90 11/11/2012 0915   GFRAA >90 11/11/2012 0915    CMP     Component Value Date/Time   NA 135 11/11/2012 0915   NA 138 10/30/2012 1151   K 3.5 11/11/2012 0915   K 4.3 10/30/2012 1151   CL 100 11/11/2012 0915   CL 106 09/03/2012 0934   CO2 26 11/11/2012 0915   CO2 27 10/30/2012 1151   GLUCOSE 101* 11/11/2012 0915   GLUCOSE 142* 10/30/2012 1151   GLUCOSE 109* 09/03/2012 0934   BUN 8 11/11/2012 0915   BUN 9.7 10/30/2012 1151   CREATININE 0.65 11/11/2012 0915   CREATININE 0.7 10/30/2012 1151   CALCIUM 9.1 11/11/2012 0915   CALCIUM 10.3 10/30/2012 1151   PROT 6.1 11/11/2012 0915   PROT 7.8 10/30/2012 1151   ALBUMIN 2.8* 11/11/2012 0915   ALBUMIN 3.4* 10/30/2012 1151   AST 17 11/11/2012 0915   AST 17 10/30/2012 1151   ALT 11 11/11/2012 0915   ALT 8 10/30/2012 1151   ALKPHOS 88 11/11/2012 0915   ALKPHOS 137 10/30/2012 1151   BILITOT 0.5 11/11/2012 0915   BILITOT 0.85 10/30/2012 1151   GFRNONAA >90 11/11/2012 0915   GFRAA >90 11/11/2012 0915      Time In Time Out Total Time Spent with Patient Total Overall Time   1400 1530 70 min 75 min    Greater than 50%  of this time was spent counseling and coordinating care related to the above assessment and plan.  Lorinda Creed NP  Palliative Medicine Team Team Phone # [phone] Pager [phone]  Discussed with Reatha Harps

## 2012-11-11 NOTE — Evaluation (Signed)
Occupational Therapy Evaluation Patient Details Name: [name] MRN: [id number] DOB: [birth year] Today's Date: 11/11/2012 Time: 0865-7846 OT Time Calculation (min): 6 min  OT Assessment / Plan / Recommendation History of present illness     Clinical Impression   Pt was admitted with nausea and vomiting.  She has a h/o stage IV colon CA with mets to liver, ovaries, and ? Lung.  Pt feels she is near baseline and does not want any further OT.  Pt has family to assist at home.     OT Assessment  Patient does not need any further OT services    Follow Up Recommendations  No OT follow up    Barriers to Discharge      Equipment Recommendations  None recommended by OT    Recommendations for Other Services    Frequency       Precautions / Restrictions Restrictions Weight Bearing Restrictions: No   Pertinent Vitals/Pain Pt was given medicine prior to OT.  Pain and nausea were OK at time of eval    ADL  Transfers/Ambulation Related to ADLs: pt got OOB and stood independently ADL Comments: Pt donned socks with set up.  She feels like she is near baseline.  She has help as needed. Pt feels she has enough endurance to do the things that she wants to do.      OT Diagnosis:    OT Problem List:   OT Treatment Interventions:     OT Goals(Current goals can be found in the care plan section)    Visit Information  Last OT Received On: 11/11/12 Assistance Needed: +1       Prior Functioning     Home Living Family/patient expects to be discharged to:: Private residence Living Arrangements: Children;Other relatives Additional Comments: pt has been able to do adls, take shower, etc.  Prior Function Level of Independence: Independent Communication Communication: No difficulties         Vision/Perception     Cognition       Extremity/Trunk Assessment Upper Extremity Assessment Upper Extremity Assessment: Generalized weakness     Mobility Bed Mobility Bed Mobility:  Supine to Sit;Sit to Supine Supine to Sit: 7: Independent Sit to Supine: 7: Independent Transfers Transfers: Sit to Stand;Stand to Sit Sit to Stand: 7: Independent Stand to Sit: 7: Independent     Exercise     Balance     End of Session OT - End of Session Activity Tolerance: Patient tolerated treatment well Patient left: in bed;with call bell/phone within reach;with family/visitor present  GO     Diogenes Whirley 11/11/2012, 11:22 AM Marica Otter, OTR/L [phone] 11/11/2012

## 2012-11-11 NOTE — Progress Notes (Signed)
11/11/12 1700  Clinical Encounter Type  Visited With Patient and family together (sister [name])  Visit Type Follow-up;Spiritual support;Social support  Spiritual Encounters  Spiritual Needs Emotional;Grief support (Advance Directives)  Stress Factors  Patient Stress Factors (EOL arrangements for self and children)  Family Stress Factors Major life changes (EOL arrangements for sister and her children)   Visited with [name] and her sister [name], who lives in [place] and with whom she is very close.  [name] is a Engineer, civil (consulting) and is helping [name] tend to the EOL decisions and logistical details, as well as planning to take [name] and her children home with her.   Provided pastoral presence, reflective listening, encouragement, support in thinking through logistical details, and a copy of advance directives/living will forms by request.  Spiritual Care will continue to follow for support.  [address] [place], [place] [phone]

## 2012-11-11 NOTE — Progress Notes (Signed)
INITIAL NUTRITION ASSESSMENT  DOCUMENTATION CODES Per approved criteria  -Severe  malnutrition in the context of social or environmental circumstances   INTERVENTION: Advance diet to regular  Ensure Complete po BID, each supplement provides 350 kcal and 13 grams of protein. Provide preferences as tolerated.  NUTRITION DIAGNOSIS: Inadequate oral intake related to chronic illness as evidenced by documentation.   Goal: Maximize oral intake  Monitor:  Intake, labs, weight, plan of care.  Reason for Assessment: mst  37 y.o. 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  Patient awaiting palliative care consult.  Prognosis is poor from the cancer.  Patient reports good intake overall with intake of 2 Ensure daily 2 weeks ago with decrease in intake over the past 2 weeks secondary to severe pain.    Patient reports UBW of 245 lbs when dx 2011.  Height: Ht Readings from Last 1 Encounters:  11/10/12 5\' 7"  (1.702 m)    Weight: Wt Readings from Last 1 Encounters:  11/10/12 119 lb (53.978 kg)    Ideal Body Weight: 135 lbs  % Ideal Body Weight: 88  Wt Readings from Last 10 Encounters:  11/10/12 119 lb (53.978 kg)  10/30/12 122 lb 6.4 oz (55.52 kg)  10/23/12 127 lb 1.6 oz (57.652 kg)  10/09/12 133 lb 6.4 oz (60.51 kg)  10/06/12 128 lb (58.06 kg)  09/03/12 147 lb 6.4 oz (66.86 kg)  08/06/12 148 lb 3.2 oz (67.223 kg)  06/25/12 158 lb 12.8 oz (72.031 kg)  06/11/12 161 lb (73.029 kg)  05/28/12 158 lb 2 oz (71.725 kg)    Usual Body Weight: 160 lbs 6 months ago  % Usual Body Weight: 76  BMI:  Body mass index is 18.63 kg/(m^2).  Estimated Nutritional Needs: Kcal: 1600-1700 Protein: 90-100 gms Fluid: >1.6L  Skin: intact  Diet Order: Clear Liquid  EDUCATION NEEDS: -No education needs identified at this  time   Intake/Output Summary (Last 24 hours) at 11/11/12 1038 Last data filed at 11/11/12 1013  Gross per 24 hour  Intake    150 ml  Output    575 ml  Net   -425 ml     Labs:   Recent Labs Lab 11/10/12 1255 11/10/12 1300 11/11/12 0915  NA 134*  --  135  K 3.6  --  3.5  CL 90*  --  100  CO2 29  --  26  BUN 15  --  8  CREATININE 0.62  --  0.65  CALCIUM 10.1  --  9.1  MG  --  1.8  --   GLUCOSE 158*  --  101*    CBG (last 3)  No results found for this basename: GLUCAP,  in the last 72 hours  Scheduled Meds: . docusate sodium  100 mg Oral BID  . enoxaparin (LOVENOX) injection  40 mg Subcutaneous Q24H  . fentaNYL  25 mcg Transdermal Q72H  . levofloxacin (LEVAQUIN) IV  500 mg Intravenous Q24H  . nicotine  21 mg Transdermal Daily  . OxyCODONE  30 mg Oral Q12H  . pantoprazole (PROTONIX) IV  40 mg Intravenous Q24H  . polyethylene glycol  17 g Oral Daily  . prochlorperazine  10 mg Intravenous Q8H  . senna-docusate  1 tablet Oral QHS    Continuous Infusions: . sodium chloride 1,000 mL (11/11/12 0258)    Past  Medical History  Diagnosis Date  . Asthma   . Gallstones   . Diverticulosis of colon (without mention of hemorrhage)   . Esophageal reflux   . Urinary tract infection   . Depression   . Anxiety   . Chronic headaches   . 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 Allergy   . Hyperthyroidism     takes Propanolol  . Seizures     last one at age 80  . colon ca dx'd 07/26/10    surg; chemo to begin  . Grave's disease   . Hypertension   . Asthma   . Colon cancer 07/25/10    sigmoid    Past Surgical History  Procedure Laterality Date  . Dilation and curettage of uterus    . Johnsten drain    . Orif acetabular fracture    . Metal plate  [id number]    Right hand  . Colon surgery  08/05/10    lap assisted sigmoid colectomy  . Portacath placement    . Hand surgery      Right hand surgery    Oran Rein, RD, LDN Clinical Inpatient Dietitian Pager:   [phone] Weekend and after hours pager:  [phone]

## 2012-11-11 NOTE — Progress Notes (Signed)
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  Prior Therapy:  1. S/P laparoscopic assisted sigmoid colectomy on 08/05/10 for a 5 cm well-differentiated adenocarcinoma with 1/20 positive lymph nodes.  2. S/P temporary ileostomy which was reversed on 10/04/10.  3. Received FOLFOX 6 on 11/07/10 and received 2 cycles. She took a month off for personal issues and resumes treatment with a 15% dose reduction due to thrombocytopenia. Completed a total of 7 cycles on 03/07/11. She was thereafter lost to follow-up.  4. FOLFIRI initiated on 02/28/12. She is here for cycle 5. She started Avastin with cycle 2. She has been off treatment since 08/2012.   Current therapy: Radiation therapy for salvage palliative purpose planned in the near future.  Patient was admitted with increased abdominal pain and FTT.  CT scan of the abdomin and pelvis showed increase of her abdominal mass and hepatic mets.  She is not feeling much better this am.   Objective:  Vital signs in last 24 hours: Temp:  [97.6 F (36.4 C)-98.7 F (37.1 C)] 98.1 F (36.7 C) (08/11 0507) Pulse Rate:  [95-120] 95 (08/11 0507) Resp:  [10-20] 18 (08/11 0507) BP: (106-140)/(60-76) 106/60 mmHg (08/11 0507) SpO2:  [98 %-100 %] 100 % (08/11 0507) Weight:  [119 lb (53.978 kg)] 119 lb (53.978 kg) (08/10 1715) Weight change:  Last BM Date: 11/10/12  Intake/Output from previous day: 08/10 0701 - 08/11 0700 In: 120 [P.O.:120] Out: 500 [Urine:500]  Mouth: mucous membranes moist, pharynx normal without lesions Resp: clear to auscultation bilaterally Cardio: regular rate and rhythm, S1, S2 normal, no murmur, click, rub or gallop GI: abnormal findings:  distended and Tender on palation. Good BS Extremities: extremities normal, atraumatic, no cyanosis or edema  Portacath without erythema  Lab Results:  Recent  Labs  11/10/12 1255  WBC 16.2*  HGB 13.3  HCT 40.1  PLT 284    BMET  Recent Labs  11/10/12 1255  NA 134*  K 3.6  CL 90*  CO2 29  GLUCOSE 158*  BUN 15  CREATININE 0.62  CALCIUM 10.1    Studies/Results: Dg Chest 2 View  11/10/2012   *RADIOLOGY REPORT*  Clinical Data: Short of breath.  Chest pain.  Abdominal pain.  CHEST - 2 VIEW  Comparison: 03/06/2012  Findings: Lungs are hyperaerated.  Chronic changes at the right lung base.  Left subclavian vein Port-A-Cath is stable.  Normal heart size.  No pneumothorax.  No pleural effusion.  IMPRESSION: No active cardiopulmonary disease.   Original Report Authenticated By: Jolaine Click, M.D.   Ct Abdomen Pelvis W Contrast  11/10/2012   *RADIOLOGY REPORT*  Clinical Data: Colon cancer, post sigmoid colectomy, chemotherapy, radiation. Abdominal pain and weight loss.  CT ABDOMEN AND PELVIS WITH CONTRAST  Technique:  Multidetector CT imaging of the abdomen and pelvis was performed following the standard protocol during bolus administration of intravenous contrast.  Contrast: OMNIPAQUE IOHEXOL 300 MG/ML  SOLN  Comparison: 06/20/2012  Findings: The visualized portions of lung bases are grossly unremarkable, imaging degraded by patient motion. 2.1 cm lesion in the anterior right hepatic lobe, previously 17 mm. No new liver lesions are evident.  There is focal fatty infiltration adjacent the falciform ligament.  Vascular clips in the gallbladder fossa.  Significant interval enlargement of a large mixed attenuation pelvic mass measuring approximately 21.8 cm maximum transverse diameter (previously 15.4  cm by my measurement), incompletely visualized distally because of patient motion now extending cephalad from the pelvis to the level of the lower pole left kidney.  The stomach is nondilated.  There are several gas and fluid distended small bowel loops in the left mid abdomen.  Colon is nondilated.  Assessment of the lower pelvis significantly degraded by  motion.  No definite adenopathy localized.  Kidneys, adrenal glands, spleen unremarkable.  Mild pancreatic parenchymal atrophy.  No definite ascites.  No free air.  Coronal and sagittal reconstructions are significantly degraded due to the motion during the primary acquisition, and by report from technologist the patient refused rescanning.  IMPRESSION:  1.  Interval progression of large pelvic mass and hepatic metastasis.   Original Report Authenticated By: D. Andria Rhein, MD    Medications: I have reviewed the patient's current medications.  Assessment/Plan:  This is a 37 year old female with the following issues:   1. Stage IV colon cancer with abdominal mass and hepatic mets. Her tumor have been chemotherapy refractory. She is S/P FOLFIRI and Avastin without much help and clear progression of disease. Her options are very limited and I doubt any further chemotherapy[y will help palliate her symptoms further. Her prognosis is poor from this cancer.   2. Abdominal pain: due to large tumor in her abdomin/pelvic area. I agree with supportive care and pain control. She is suppose to have palliative radiation in the near future.   3. Prognosis and disposition: I agree with palliative care consult to help with symptom management and goals of care.    LOS: 1 day   [facility] 11/11/2012, 8:55 AM

## 2012-11-11 NOTE — Procedures (Signed)
Evaluate left portacath because not aspirating.  Fluoroscopy demonstrates that the catheter tip is well within the right atrium and near the tricuspid valve.  Catheter injection demonstrates a large fibrin sheath along the tip of the catheter.  Suggest revision or removal of the catheter.

## 2012-11-11 NOTE — Progress Notes (Signed)
11/11/12 1400  Clinical Encounter Type  Visited With Patient;Health care provider Lorinda Creed, NP present)  Visit Type Initial  Referral From Physician Ramiro Harvest, MD)   Made brief visit to meet pt, begin to build rapport, and introduce spiritual care/chaplain availability.  Lorinda Creed, NP was meeting with Herbert Seta, so we planned for spiritual care to follow up later (this afternoon, if possible).   [address] [place], [place] [phone]

## 2012-11-12 ENCOUNTER — Ambulatory Visit: Payer: Self-pay

## 2012-11-12 DIAGNOSIS — R627 Adult failure to thrive: Secondary | ICD-10-CM

## 2012-11-12 DIAGNOSIS — R109 Unspecified abdominal pain: Secondary | ICD-10-CM

## 2012-11-12 DIAGNOSIS — R112 Nausea with vomiting, unspecified: Secondary | ICD-10-CM

## 2012-11-12 DIAGNOSIS — N390 Urinary tract infection, site not specified: Secondary | ICD-10-CM

## 2012-11-12 DIAGNOSIS — C189 Malignant neoplasm of colon, unspecified: Secondary | ICD-10-CM

## 2012-11-12 DIAGNOSIS — Z515 Encounter for palliative care: Secondary | ICD-10-CM

## 2012-11-12 DIAGNOSIS — C787 Secondary malignant neoplasm of liver and intrahepatic bile duct: Secondary | ICD-10-CM

## 2012-11-12 LAB — CBC
HCT: 30.5 % — ABNORMAL LOW (ref 36.0–46.0)
Platelets: 188 10*3/uL (ref 150–400)
RBC: 3.79 MIL/uL — ABNORMAL LOW (ref 3.87–5.11)
RDW: 14.8 % (ref 11.5–15.5)
WBC: 11.6 10*3/uL — ABNORMAL HIGH (ref 4.0–10.5)

## 2012-11-12 LAB — BASIC METABOLIC PANEL
Chloride: 99 mEq/L (ref 96–112)
GFR calc Af Amer: >90 mL/min (ref >90)
Potassium: 3.6 mEq/L (ref 3.5–5.1)

## 2012-11-12 LAB — URINE CULTURE
Colony Count: NO GROWTH
Culture: NO GROWTH

## 2012-11-12 MED ORDER — PROMETHAZINE HCL 25 MG/ML IJ SOLN
12.5000 mg | INTRAMUSCULAR | Status: DC | PRN
  Administered 2012-11-12: 12.5 mg via INTRAVENOUS

## 2012-11-12 MED ORDER — LEVALBUTEROL HCL 0.63 MG/3ML IN NEBU: 0.6300 mg | INHALATION_SOLUTION | Freq: Four times a day (QID) | RESPIRATORY_TRACT | Status: DC | PRN

## 2012-11-12 MED ORDER — PANTOPRAZOLE SODIUM 40 MG PO TBEC
40.0000 mg | DELAYED_RELEASE_TABLET | Freq: Every day | ORAL | Status: DC
  Administered 2012-11-12: 40 mg via ORAL
  Filled 2012-11-12: qty 1

## 2012-11-12 MED ORDER — HYDROCORTISONE SOD SUCCINATE 100 MG IJ SOLR
100.0000 mg | Freq: Three times a day (TID) | INTRAMUSCULAR | Status: DC
  Administered 2012-11-12 – 2012-11-13 (×3): 100 mg via INTRAVENOUS
  Filled 2012-11-12 (×6): qty 2

## 2012-11-12 MED ORDER — PROMETHAZINE HCL 25 MG/ML IJ SOLN
25.0000 mg | INTRAMUSCULAR | Status: DC | PRN
  Administered 2012-11-12 (×2): 25 mg via INTRAVENOUS
  Filled 2012-11-12 (×3): qty 1

## 2012-11-12 MED ORDER — PANTOPRAZOLE SODIUM 40 MG IV SOLR
40.0000 mg | Freq: Every day | INTRAVENOUS | Status: DC
  Administered 2012-11-13: 40 mg via INTRAVENOUS
  Filled 2012-11-12 (×2): qty 40

## 2012-11-12 MED ORDER — PROPRANOLOL HCL 40 MG PO TABS
40.0000 mg | ORAL_TABLET | Freq: Two times a day (BID) | ORAL | Status: DC
  Filled 2012-11-12 (×4): qty 1

## 2012-11-12 MED ORDER — ONDANSETRON HCL 4 MG/2ML IJ SOLN
4.0000 mg | INTRAMUSCULAR | Status: DC | PRN
  Administered 2012-11-12 – 2012-11-13 (×2): 4 mg via INTRAVENOUS
  Administered 2012-11-13: 8 mg via INTRAVENOUS
  Filled 2012-11-12 (×2): qty 2
  Filled 2012-11-12: qty 4

## 2012-11-12 MED ORDER — HYDROMORPHONE HCL PF 2 MG/ML IJ SOLN: 2.0000 mg | INTRAMUSCULAR | Status: DC | PRN

## 2012-11-12 MED ORDER — PANTOPRAZOLE SODIUM 40 MG IV SOLR
40.0000 mg | Freq: Every day | INTRAVENOUS | Status: DC
  Filled 2012-11-12: qty 40

## 2012-11-12 NOTE — Progress Notes (Addendum)
[facility] PROGRESS NOTE  [name] ZOX:[id number] DOB: 01-Dec-1975 DOA: 11/10/2012 PCP: Wilmer Floor., MD  Brief narrative: 37 y.o. 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 Patient stated she is on chronic pain medications and awaiting for consideration for surgery at [facility] but has not been fully evaluated secondary to insurance issues.  In the ED urinalysis positive for nitrite, small leukocytes with 3-6 WBCs. Urine pregnancy negative. CMP relatively unremarkable and CBC with white count of 16.2. CT of the abdomen and pelvis showed interval progression of large pelvic mass and hepatic metastases.  Assessment/Plan:   Principal Problem:  Nausea and vomiting, abdomina pain  - secondary to history of metastatic colon cancer  - continue supportive care with phenergan 25 mg every 4 hours IV as needed in addition to ativan IV PRN for intractable N/V, given NS bolus 1 L, analgesia with dilaudid 2 mg every 2 hours IV PRN severe pain, oxycodone 10 mg every 4 hours PO PRN moderate pain, fentanyl patch 25 mcg every 3 days and OxyContin 30 mg PO Q 12 hours scheduled - protonix 40 mg daily IV  - diet as tolerated  Active Problems:  HYPERTHYROIDISM - possible thyroid storm - TSH less than 0.008 - started solu-cortef 100 mg Q 8 hours IV and propranolol 40 mg BID Colon cancer  - appreciate oncology assistance  - appreciate palliative care assistance in management; goals of care Anemia of chronic disease - secondary to history of malignancy - hemoglobin  9.6 - no indications for transfusion Leukocytosis  - secondary to UTI - continue empiric Levaquin - WBC count trending down Hyponatremia - secondary to dehydration - given 1 L normal saline bolus today - follow up BMP in am Constipation  - Monitor bowel regimen; have her on colace 100 mg PO BID and miralax but she refuses this as she claims it makes her have diarrhea - had diarrhea this am so will test if C.diff - D/C colace  UTI (lower urinary tract infection)  - on empiric Levaquin - urine culture shows no growth - will finish day 3 antibiotic tomorrow and then stop Severe protein calorie malnutrition, adult failure to thrive - on feeding supplements Tobacco use - nicotine patch ordered Asthma - stable - nebulizer treatments as needed every 6 hours  Code Status: Full  Family Communication: Pt at bedside  Disposition Plan: home when stable   Manson Passey, MD  [facility]  Pager [phone]   Consultants:  Oncologist  Palliative care - for goals of care Studies:  Dg Chest 2 View 11/10/2012 No active cardiopulmonary disease.  Ct Abdomen Pelvis W Contrast 11/10/2012 Interval progression of large pelvic mass and hepatic metastasis.  Antibiotics:  Levaquin 8/10 -->   If 7PM-7AM, please contact night-coverage [email or web address] Password [facility] 11/12/2012, 6:38 AM   LOS: 2 days    HPI/Subjective: Abdominal pain not relieved with morphine.   Objective: Filed Vitals:   11/11/12 0507 11/11/12 1338 11/11/12 2100 11/12/12 0443  BP: 106/60 107/62 111/61 96/52  Pulse: 95 92 104 91  Temp: 98.1 F (36.7 C) 98.7 F (37.1 C) 99.3 F (37.4 C) 97.3 F (36.3 C)  TempSrc: Oral Oral Oral Oral  Resp: 18 16 15 18   Height:      Weight:      SpO2: 100% 95% 97% 96%    Intake/Output Summary (Last 24 hours) at 11/12/12 1610 Last data filed at 11/12/12 0444  Gross per 24 hour  Intake   1750 ml  Output     75 ml  Net   1675 ml    Exam:   General:  Pt is alert, follows  commands appropriately, not in acute distress; very cachectic; has proptosis  Cardiovascular: Regular rate and rhythm, S1/S2, no murmurs, no rubs, no gallops  Respiratory: Clear to auscultation bilaterally, no wheezing, no crackles, no rhonchi  Abdomen: Soft, tender in mid to lower abdomen to light palpation, non distended, bowel sounds present, no guarding  Extremities: No edema, pulses DP and PT palpable bilaterally  Neuro: Grossly nonfocal  Data Reviewed: Basic Metabolic Panel:  Recent Labs Lab 11/10/12 1255 11/10/12 1300 11/11/12 0915 11/12/12 0515  NA 134*  --  135 131*  K 3.6  --  3.5 3.6  CL 90*  --  100 99  CO2 29  --  26 24  GLUCOSE 158*  --  101* 91  BUN 15  --  8 10  CREATININE 0.62  --  0.65 0.60  CALCIUM 10.1  --  9.1 9.0  MG  --  1.8  --   --    Liver Function Tests:  Recent Labs Lab 11/10/12 1255 11/11/12 0915  AST 18 17  ALT 12 11  ALKPHOS 118* 88  BILITOT 0.7 0.5  PROT 7.4 6.1  ALBUMIN 3.4* 2.8*    Recent Labs Lab 11/10/12 1255  LIPASE 13   No results found for this basename: AMMONIA,  in the last 168 hours CBC:  Recent Labs Lab 11/10/12 1255 11/11/12 0915 11/12/12 0515  WBC 16.2* 11.2* 11.6*  HGB 13.3 10.8* 9.6*  HCT 40.1 33.2* 30.5*  MCV 78.2 79.8 80.5  PLT 284 209 188   Cardiac Enzymes: No results found for this basename: CKTOTAL, CKMB, CKMBINDEX, TROPONINI,  in the last 168 hours BNP: No components found with this basename: POCBNP,  CBG: No results found for this basename: GLUCAP,  in the last 168 hours  Recent Results (from the past 240 hour(s))  URINE CULTURE     Status: None   Collection Time    11/10/12 12:32 PM      Result Value Range Status   Specimen Description URINE, RANDOM   Final   Special Requests NONE   Final   Culture  Setup Time     Final   Value: 11/11/2012 03:34     Performed at [facility] Count     Final   Value: NO GROWTH     Performed at [facility]   Culture      Final   Value: NO GROWTH     Performed at [facility]   Report Status 11/12/2012 FINAL   Final  CULTURE, BLOOD (ROUTINE X 2)     Status: None   Collection Time    11/10/12  5:05 PM      Result Value Range Status   Specimen Description BLOOD LEFT ARM  5 ML IN [facility] BOTTLE   Final   Special Requests NONE   Final   Culture  Setup Time     Final   Value: 11/10/2012 20:02     Performed at [facility]   Culture     Final   Value:        BLOOD CULTURE RECEIVED NO GROWTH TO DATE CULTURE WILL BE HELD FOR 5 DAYS BEFORE ISSUING A FINAL NEGATIVE REPORT     Performed at [facility]   Report Status PENDING   Incomplete  CULTURE, BLOOD (ROUTINE X 2)     Status: None   Collection Time    11/10/12  5:05 PM      Result Value Range Status   Specimen Description BLOOD RIGHT ARM  3 ML IN [facility] BOTTLE   Final   Special Requests NONE   Final   Culture  Setup Time     Final   Value: 11/10/2012 20:03     Performed at [facility]   Culture     Final   Value:        BLOOD CULTURE RECEIVED NO GROWTH TO DATE CULTURE WILL BE HELD FOR 5 DAYS BEFORE ISSUING A FINAL NEGATIVE REPORT     Performed at [facility]   Report Status PENDING   Incomplete     Studies: Dg Chest 2 View  11/10/2012   *RADIOLOGY REPORT*  Clinical Data: Short of breath.  Chest pain.  Abdominal pain.  CHEST - 2 VIEW  Comparison: 03/06/2012  Findings: Lungs are hyperaerated.  Chronic changes at the right lung base.  Left subclavian vein Port-A-Cath is stable.  Normal heart size.  No pneumothorax.  No pleural effusion.  IMPRESSION: No active cardiopulmonary disease.   Original Report Authenticated By: Jolaine Click, M.D.   Ct Abdomen Pelvis W Contrast  11/10/2012   *RADIOLOGY REPORT*  Clinical Data: Colon cancer, post sigmoid colectomy, chemotherapy, radiation. Abdominal pain and weight loss.  CT ABDOMEN AND PELVIS WITH CONTRAST  Technique:  Multidetector CT imaging of the abdomen and pelvis was performed  following the standard protocol during bolus administration of intravenous contrast.  Contrast: OMNIPAQUE IOHEXOL 300 MG/ML  SOLN  Comparison: 06/20/2012  Findings: The visualized portions of lung bases are grossly unremarkable, imaging degraded by patient motion. 2.1 cm lesion in the anterior right hepatic lobe, previously 17 mm. No new liver lesions are evident.  There is focal fatty infiltration adjacent the falciform ligament.  Vascular clips in the gallbladder fossa.  Significant interval enlargement of a large mixed attenuation pelvic mass measuring approximately 21.8 cm maximum transverse diameter (previously 15.4 cm by my measurement), incompletely visualized distally because of patient motion now extending cephalad from the pelvis to the level of the lower pole left kidney.  The stomach is nondilated.  There are several gas and fluid distended small bowel loops in the left mid abdomen.  Colon is nondilated.  Assessment of the lower pelvis significantly degraded by motion.  No definite adenopathy localized.  Kidneys, adrenal glands, spleen unremarkable.  Mild pancreatic parenchymal atrophy.  No definite ascites.  No free air.  Coronal and sagittal reconstructions are significantly degraded due to the motion during the primary acquisition, and by report from technologist the patient refused rescanning.  IMPRESSION:  1.  Interval progression of large pelvic mass and hepatic metastasis.   Original Report Authenticated By: D. Andria Rhein, MD   Ir Cv Line Injection  11/11/2012   *RADIOLOGY REPORT*  Clinical history:Left subclavian Port-A-Cath is not aspirating.  PROCEDURE(S): CATHETER INJECTION WITH FLUOROSCOPY  Physician: Rachelle Hora. Henn, MD  Medications:None  Moderate sedation time:None  Fluoroscopy time: 24 seconds  Contrast:  10 ml Omnipaque-300  Procedure:The port was already accessed.  The port was flushed with difficulty but would not aspirate.  Fluoroscopic images were obtained.  The catheter was  injected with contrast under fluoroscopy.  Catheter was flushed with normal saline at the end of the procedure.  Findings:Catheter tip is in the right atrium near the tricuspid valve.  There is no evidence for a catheter fracture or discontinuity.  There is contrast pooling along the end of the catheter and its tip.  These findings are most compatible with a large fibrin sheath.  Complications: None  Impression:Large fibrin sheath along the end of the catheter.  In addition, the catheter tip is well within the right atrium. Consider revision or removal of the Port-A-Cath.   Original Report Authenticated By: Richarda Overlie, M.D.    Scheduled Meds: . docusate sodium  100 mg Oral BID  . enoxaparin (LOVENOX)s  40 mg Subcutaneous Q24H  . feeding supplement  237 mL Oral BID BM  . fentaNYL  25 mcg Transdermal Q72H  . levofloxacin (LEVAQUIN)   500 mg Intravenous Q24H  . nicotine  21 mg Transdermal Daily  . OxyCODONE  30 mg Oral Q12H  . pantoprazole  40 mg Intravenous Q24H  . polyethylene glycol  17 g Oral Daily  . prochlorperazine  10 mg Intravenous Q8H  . senna-docusate  1 tablet Oral QHS   Continuous Infusions: . sodium chloride 1,000 mL (11/12/12 0436)

## 2012-11-12 NOTE — Progress Notes (Signed)
Clinical Social Work Department BRIEF PSYCHOSOCIAL ASSESSMENT 11/12/2012  Patient:  [name], [name]     Account Number:  [id number]     Admit date:  11/10/2012  Clinical Social Worker:  Jacelyn Grip  Date/Time:  11/12/2012 09:30 AM  Referred by:  Physician  Date Referred:  11/12/2012 Referred for  Advanced Directives   Other Referral:   Interview type:  Other - See comment Other interview type:   discussion with Chaplain    PSYCHOSOCIAL DATA Living Status:  FAMILY Admitted from facility:   Level of care:   Primary support name:  Lawanna Kobus, sister Primary support relationship to patient:  SIBLING Degree of support available:   strong    CURRENT CONCERNS Current Concerns  Other - See comment   Other Concerns:   Advanced Directives    SOCIAL WORK ASSESSMENT / PLAN CSW received referral for Advanced Directives.    CSW reviewed chart and noted that chaplain services has rapport with patient and provided pt advanced directives packet.    CSW contacted chaplain services who stated that they planned to folow up re: Advanced Directives today.    Chaplain Services will notify CSW if assistance needed with arranging a notary once Forensic scientist is completed.    CSW to remain available to assist with arranging notary if needed.   Assessment/plan status:  Other - See comment Other assessment/ plan:   Advanced Directives   Information/referral to community resources:   Referral to [employer]    PATIENT'S/FAMILY'S RESPONSE TO PLAN OF CARE: No communication with patient and patient family at this time.    Chaplain services is involved with assisting pt with advanced directives.    CSW to remain available to assist as appropriate with arranging notary.     Jacklynn Lewis, MSW, LCSWA  Clinical Social Work [phone]

## 2012-11-12 NOTE — Clinical Documentation Improvement (Signed)
THIS DOCUMENT IS NOT A PERMANENT PART OF THE MEDICAL RECORD  Please update your documentation with the medical record to reflect your response to this query. If you need help knowing how to do this please call [phone].  11/12/12   Dear Dr. Elisabeth Pigeon / Associates,  In a better effort to capture your patient's severity of illness, reflect appropriate length of stay and utilization of resources, a review of the patient medical record has revealed the following indicators.    Based on your clinical judgment, please clarify and document in a progress note and/or discharge summary the clinical condition associated with the following supporting information:  In responding to this query please exercise your independent judgment.  The fact that a query is asked, does not imply that any particular answer is desired or expected. Noted nutritional assessment on 11/11/12 Severe malnutrition in the context of social or environmental  Circumstances. Please specify on progress note clarification of pt nutritional status. Thank you. Possible Clinical Conditions?  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 Patient awaiting palliative care consult. Prognosis is poor from the cancer.  Patient reports good intake overall with intake of 2 Ensure daily 2 weeks ago with decrease in intake over the past 2 weeks secondary to severe pain.  Patient reports UBW of 245 lbs when dx 2011.  Treatment   INTERVENTION:  Advance diet to regular  Ensure Complete po BID, each supplement provides 350 kcal and 13 grams of protein.  Provide preferences as tolerated. Monitor:  Intake, labs, weight, plan of care    Nutrition Consult:   -Severe malnutrition in the context of social or environmental circumstances        You may use possible, probable, or suspect with inpatient documentation. possible, probable, suspected diagnoses MUST be documented at the time of discharge  Reviewed: change made in progress note 11/12/12  Thank You,  Andy Gauss  Clinical Documentation Specialist: [phone] Health Information Management [place]

## 2012-11-12 NOTE — Progress Notes (Signed)
Chaplain follow up for continued support and advance directive.  Pt lethargic / not coherent.  Unable to complete advance directive at this time.  Provided support with pt, s/o, sister at bedside.  Sister reports that pt is most alert in morning.  Chaplain will return at 8:30 am and attempt to complete advance directive packet with family.    Belva Crome MDiv

## 2012-11-12 NOTE — Progress Notes (Signed)
Palliative Medicine Team SW Psychosocial follow up with pt and unit CSW for emotional support to assistance with advance directive paperwork. Pt's fiance was sleeping in pt's bed, pt very drowsy, sitting up but falling asleep during discussion. CSW and I encouraged pt to rest and try to complete advance directive paperwork when she is in a clearer mental state. Sister not present in room.  Reviewed with RN, note Chaplain to follow up, available as needed.   Kennieth Francois, LCSW PMT Phone [phone] Pager [phone]

## 2012-11-12 NOTE — Progress Notes (Signed)
  Discussed with Dr Elisabeth Pigeon plan for pain management.  Will shadow at this time  Will re evaluate in am.  Social visit to bedside.  This NP will continue to support holistically.  Lorinda Creed NP  Palliative Medicine Team Team Phone # [phone] Pager [phone]

## 2012-11-13 ENCOUNTER — Ambulatory Visit: Payer: Self-pay

## 2012-11-13 MED ORDER — TRAZODONE 25 MG HALF TABLET: 25.0000 mg | ORAL_TABLET | Freq: Every day | ORAL | Status: AC

## 2012-11-13 MED ORDER — PROPRANOLOL HCL 40 MG PO TABS: 40.0000 mg | ORAL_TABLET | Freq: Every day | ORAL | Status: AC | PRN

## 2012-11-13 MED ORDER — ALBUTEROL SULFATE HFA 108 (90 BASE) MCG/ACT IN AERS: 2.0000 | INHALATION_SPRAY | RESPIRATORY_TRACT | Status: AC | PRN

## 2012-11-13 MED ORDER — PROMETHAZINE HCL 12.5 MG PO TABS: 12.5000 mg | ORAL_TABLET | Freq: Four times a day (QID) | ORAL | Status: AC | PRN

## 2012-11-13 MED ORDER — OXYCODONE HCL ER 30 MG PO T12A: 30.0000 mg | EXTENDED_RELEASE_TABLET | Freq: Two times a day (BID) | ORAL | Status: AC

## 2012-11-13 MED ORDER — FENTANYL 25 MCG/HR TD PT72: 1.0000 | MEDICATED_PATCH | TRANSDERMAL | Status: AC

## 2012-11-13 MED ORDER — OXYBUTYNIN CHLORIDE 5 MG PO TABS: 5.0000 mg | ORAL_TABLET | Freq: Three times a day (TID) | ORAL | Status: AC | PRN

## 2012-11-13 MED ORDER — PROMETHAZINE HCL 25 MG/ML IJ SOLN
25.0000 mg | Freq: Three times a day (TID) | INTRAMUSCULAR | Status: DC | PRN
  Administered 2012-11-13: 12.5 mg via INTRAVENOUS
  Filled 2012-11-13: qty 1

## 2012-11-13 MED ORDER — OXYCODONE HCL 5 MG PO TABS: 5.0000 mg | ORAL_TABLET | Freq: Four times a day (QID) | ORAL | Status: AC | PRN

## 2012-11-13 MED ORDER — PREDNISONE 50 MG PO TABS: ORAL_TABLET | ORAL | Status: AC

## 2012-11-13 MED ORDER — HEPARIN SOD (PORK) LOCK FLUSH 100 UNIT/ML IV SOLN
500.0000 [IU] | INTRAVENOUS | Status: AC | PRN
  Administered 2012-11-13: 500 [IU]
  Filled 2012-11-13: qty 5

## 2012-11-13 MED ORDER — LORAZEPAM 0.5 MG PO TABS: 0.5000 mg | ORAL_TABLET | Freq: Three times a day (TID) | ORAL | Status: AC | PRN

## 2012-11-13 NOTE — Progress Notes (Signed)
Pt discharged home with sister in stable condition. Discharge instructions and scripts given. Pt verbalized understanding. 

## 2012-11-13 NOTE — Discharge Instructions (Signed)

## 2012-11-13 NOTE — Progress Notes (Signed)
CSW received notification from [place], Burnis Kingfisher, that advanced directive was completed and notarized.  Pt discharging home today.  Appreciate Chaplain assistance with Advanced Directives.  No further social work needs identified at this time.  CSW signing off.    Jacklynn Lewis, MSW, LCSWA  Clinical Social Work [phone]

## 2012-11-13 NOTE — Care Management (Addendum)
Cm spoke with patient at bedside with sister and fiancee present concerning discharge planning. Per pt dc plan includes implementing documentation for sister to become HPOA. Pt plans to continue radiology treatments at [facility]. Pt's fiancee to provide tx.PCP: Wilmer Floor., MD.Specialists: Oncology: Dr. Clelia Croft. Pt utilizes [facility] outpatient pharmacy. No other needs identified.   Roxy Manns Daylene Vandenbosch,RN,MSN [phone]

## 2012-11-13 NOTE — Progress Notes (Signed)
Chaplain provided support with family around advance directive.  LCSW [employer] notarized.

## 2012-11-13 NOTE — Discharge Summary (Signed)
Physician Discharge Summary  [name] AVW:[id number] DOB: Oct 05, 1975 DOA: 11/10/2012  PCP: Wilmer Floor., MD  Admit date: 11/10/2012 Discharge date: 11/13/2012  Recommendations for Outpatient Follow-up:  1. we will prescribe Phenergan 12.5 mg every 6 hours as needed by mouth for nausea or vomiting. While in hospital you were given Phenergan 25 mg and you were very drowsy so please take caution when taking this medication. You have informed us that Ativan helps with nausea and so we have provided a prescription for 0.5 mg every 8 hours as needed by mouth.  2. you may continue taking fentanyl patch and 25 mcg every 72 hours. Prescription was also provided for OxyContin 30 mg every 12 hours scheduled. You also are taking oxycodone 5-10 mg every 4 hours by mouth as needed for breakthrough pain.  3. you were thyroid hormone level was less than 0.008. You informed us that you were taken off of propylthiouracil and now are on as needed propranolol 40 mg daily. Please measure your blood pressure prior to taking this medication but we advise you to continue taking this for Graves' disease. In addition continue taking prednisone 50 mg daily for additional 10 days. We have instructed you to follow with your primary care physician for followup and recheck of your TSH level  4. you still may followup at [facility] for possible surgery.   Discharge Diagnoses:  Principal Problem:   Nausea and vomiting in adult patient Active Problems:   Abdominal pain   UTI (lower urinary tract infection)   HYPERTHYROIDISM   TOBACCO USER   Colon cancer   Dehydration   Leukocytosis   Palliative care encounter   Adult failure to thrive   Protein-calorie malnutrition, severe   ASTHMA, INTERMITTENT   Discharge Condition: Medically stable for discharge home with hospice today  Diet recommendation: As tolerated  History of present illness:  37 y.o. 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 Patient stated she is on chronic pain medications and awaiting for consideration for surgery at [facility] but has not been fully evaluated secondary to insurance issues.  In the ED urinalysis positive for nitrite, small leukocytes with 3-6 WBCs. Urine pregnancy negative. CMP relatively unremarkable and CBC with white count of 16.2. CT of the abdomen and pelvis showed interval progression of large pelvic mass and hepatic metastases.   Assessment/Plan:   Principal Problem:  Nausea and vomiting, abdomina pain  - secondary to history of metastatic colon cancer  - patient is medically stable and wants to go home. We have provided prescriptions for fentanyl patch 25 mcg every 72 hours, OxyContin 30 mg every 12 hours, oxycodone 5-10 mg every 4 hours PO PRN moderate pain.  - for nausea we prescribed lower dose phenergan per patient's and family wishes 12.5 mg every 4-6 hours as needed in addition to ativan 0.5 mg every 8 hours as need. - patient will go home with hospice  Active Problems:  HYPERTHYROIDISM  - possible thyroid storm, Graves disease - TSH less than 0.008  - per pt she was on PTU but she was taken off of it by PCP. She was then given  as needed propranolol but as needed only due to low BP. I also gave her prednisone 50 gm daily for 10 days and in that way she would not need taper if PCP decides to stop prednisone. She was on solu-cortef in hospital  Colon cancer  - appreciate palliative care assistance in management; goals of care; pt will go home with hospice Anemia of chronic  disease  - secondary to history of malignancy  - hemoglobin 9.6  - no indications for transfusion  Leukocytosis  - secondary to UTI  - has received Levaquin for 3 days. She does not need antibiotics at the time of discharge Hyponatremia  - secondary to dehydration  - given 1 L normal saline bolus 11/12/2012 Constipation  - diarrhea subsided. C diff negative UTI (lower urinary tract infection)  - urine culture shows no growth  - has completed 3 days of Levaquin. Stop today.  Severe protein calorie malnutrition, adult failure to thrive  - on feeding supplements  Tobacco use  - nicotine patch prescribed Asthma  - stable  - nebulizer treatments as needed every 6 hours while in hospital; inh at home  Code Status: Full  Family Communication: Pt at bedside   Manson Passey, MD  [facility]  Pager [phone]   Consultants:  Oncologist  Palliative care - for goals of care Studies:  Dg Chest 2 View 11/10/2012 No active cardiopulmonary disease.  Ct Abdomen Pelvis W Contrast 11/10/2012 Interval progression of large pelvic mass and hepatic metastasis.  Antibiotics:  Levaquin 8/10 --> 11/13/2012   Discharge Exam: Filed Vitals:   11/13/12 0550  BP: 112/61  Pulse: 89  Temp: 98.4 F (36.9 C)  Resp: 18   Filed Vitals:   11/12/12 1400 11/12/12 1958 11/12/12 2130 11/13/12 0550  BP: 118/60  114/62 112/61  Pulse: 107 113 93 89  Temp: 98.9 F (37.2 C)  98.3 F (36.8 C) 98.4 F (36.9 C)  TempSrc: Oral  Oral Oral  Resp: 16  16 18   Height:      Weight:    59.33 kg (130 lb 12.8 oz)  SpO2: 98%  95% 96%    General: Pt is alert, follows commands appropriately, not in acute distress; cachectic; proptosis Cardiovascular: Regular rate and rhythm, S1/S2 +, no murmurs, no rubs, no gallops Respiratory: Clear to auscultation bilaterally, no wheezing, no crackles, no rhonchi Abdominal: Soft, tender in mid abdomen, non distended, bowel sounds +, no guarding Extremities: no edema, no  cyanosis, pulses palpable bilaterally DP and PT Neuro: Grossly nonfocal  Discharge Instructions  Discharge Orders   Future Appointments Provider Department Dept Phone   12/18/2012 10:30 AM Delcie Roch [facility] [phone]   12/18/2012 11:15 AM Myrtis Ser, NP [facility] [phone]   Future Orders Complete By Expires   Call MD for:  difficulty breathing, headache or visual disturbances  As directed    Call MD for:  persistant dizziness or light-headedness  As directed    Call MD for:  persistant nausea and vomiting  As directed    Call MD for:  severe uncontrolled pain  As directed    Diet - low sodium heart healthy  As directed    Discharge instructions  As directed    Comments:     1. we will prescribe Phenergan 12.5 mg every 6 hours as needed by mouth for nausea or vomiting. While in hospital you were given Phenergan 25 mg and you were very  drowsy so please take caution when taking this medication. You have informed us that Ativan helps with nausea and so we have provided a prescription for 0.5 mg every 8 hours as needed by mouth. 2. you may continue taking fentanyl patch and 25 mcg every 72 hours. Prescription was also provided for OxyContin 30 mg every 12 hours scheduled. You also are taking oxycodone 5-10 mg every 4 hours by mouth as needed for breakthrough pain. 3. you were thyroid hormone level was less than 0.008. You informed us that you were taken off of propylthiouracil and now are on as needed propranolol 40 mg daily. Please measure your blood pressure prior to taking this medication but we advise you to continue taking this for Graves' disease. In addition continue taking prednisone 50 mg daily for additional 10 days. We have instructed you to follow with your primary care physician for followup and recheck of your TSH level 4. you still may followup at [facility] for possible surgery.   Increase activity  slowly  As directed        Medication List    STOP taking these medications       ondansetron 8 MG tablet  Commonly known as:  ZOFRAN     oxyCODONE 20 MG 12 hr tablet  Commonly known as:  OXYCONTIN  Replaced by:  OxyCODONE HCl ER 30 MG T12a      TAKE these medications       albuterol 108 (90 BASE) MCG/ACT inhaler  Commonly known as:  PROAIR HFA  Inhale 2 puffs into the lungs every 4 (four) hours as needed for wheezing or shortness of breath.     dexamethasone 4 MG tablet  Commonly known as:  DECADRON  Take 8 mg by mouth as directed. Taken on first 3 days post-chemo.     diphenoxylate-atropine 2.5-0.025 MG per tablet  Commonly known as:  LOMOTIL  Take 2 tablets by mouth 4 (four) times daily as needed for diarrhea or loose stools.     fentaNYL 25 MCG/HR patch  Commonly known as:  DURAGESIC - dosed mcg/hr  Place 1 patch (25 mcg total) onto the skin every 3 (three) days.     lidocaine-prilocaine cream  Commonly known as:  EMLA  Apply 1 application topically as needed (for port-a-cath access).     LORazepam 0.5 MG tablet  Commonly known as:  ATIVAN  Take 1 tablet (0.5 mg total) by mouth every 8 (eight) hours as needed (for nausea).     oxybutynin 5 MG tablet  Commonly known as:  DITROPAN  Take 1 tablet (5 mg total) by mouth every 8 (eight) hours as needed.     oxyCODONE 5 MG immediate release tablet  Commonly known as:  Oxy IR/ROXICODONE  Take 1-2 tablets (5-10 mg total) by mouth every 6 (six) hours as needed for pain.     OxyCODONE HCl ER 30 MG T12a  Take 30 mg by mouth every 12 (twelve) hours.     polyethylene glycol packet  Commonly known as:  MIRALAX / GLYCOLAX  Take 17 g by mouth daily as needed (for constipation).     predniSONE 50 MG tablet  Commonly known as:  DELTASONE  Take for 10 days.     promethazine 12.5 MG tablet  Commonly known as:  PHENERGAN  Take 1 tablet (12.5 mg total) by mouth every 6 (six) hours as needed for nausea.     propranolol 40  MG tablet  Commonly known as:  INDERAL  Take 1 tablet (40 mg total) by mouth daily as needed (for hypertension).     STOOL SOFTENER 100 MG capsule  Generic drug:  Docusate Sodium  Take 100-200 mg by mouth 2 (two) times daily as needed for constipation.     traZODone 25 mg Tabs tablet  Commonly known as:  DESYREL  Take 0.5 tablets (25 mg total) by mouth at bedtime.           Follow-up Information   Follow up with CAMPBELL, STEPHEN D., MD In 1 week.   Specialty:  Internal Medicine   Contact information:   [address] [place] [address] [phone]        The results of significant diagnostics from this hospitalization (including imaging, microbiology, ancillary and laboratory) are listed below for reference.    Significant Diagnostic Studies: Dg Chest 2 View  11/10/2012   *RADIOLOGY REPORT*  Clinical Data: Short of breath.  Chest pain.  Abdominal pain.  CHEST - 2 VIEW  Comparison: 03/06/2012  Findings: Lungs are hyperaerated.  Chronic changes at the right lung base.  Left subclavian vein Port-A-Cath is stable.  Normal heart size.  No pneumothorax.  No pleural effusion.  IMPRESSION: No active cardiopulmonary disease.   Original Report Authenticated By: Jolaine Click, M.D.   Ct Abdomen Pelvis W Contrast  11/10/2012   *RADIOLOGY REPORT*  Clinical Data: Colon cancer, post sigmoid colectomy, chemotherapy, radiation. Abdominal pain and weight loss.  CT ABDOMEN AND PELVIS WITH CONTRAST  Technique:  Multidetector CT imaging of the abdomen and pelvis was performed following the standard protocol during bolus administration of intravenous contrast.  Contrast: OMNIPAQUE IOHEXOL 300 MG/ML  SOLN  Comparison: 06/20/2012  Findings: The visualized portions of lung bases are grossly unremarkable, imaging degraded by patient motion. 2.1 cm lesion in the anterior right hepatic lobe, previously 17 mm. No new liver lesions are evident.  There is focal fatty infiltration adjacent  the falciform ligament.  Vascular clips in the gallbladder fossa.  Significant interval enlargement of a large mixed attenuation pelvic mass measuring approximately 21.8 cm maximum transverse diameter (previously 15.4 cm by my measurement), incompletely visualized distally because of patient motion now extending cephalad from the pelvis to the level of the lower pole left kidney.  The stomach is nondilated.  There are several gas and fluid distended small bowel loops in the left mid abdomen.  Colon is nondilated.  Assessment of the lower pelvis significantly degraded by motion.  No definite adenopathy localized.  Kidneys, adrenal glands, spleen unremarkable.  Mild pancreatic parenchymal atrophy.  No definite ascites.  No free air.  Coronal and sagittal reconstructions are significantly degraded due to the motion during the primary acquisition, and by report from technologist the patient refused rescanning.  IMPRESSION:  1.  Interval progression of large pelvic mass and hepatic metastasis.   Original Report Authenticated By: D. Andria Rhein, MD   Ir Cv Line Injection  11/11/2012   *RADIOLOGY REPORT*  Clinical history:Left subclavian Port-A-Cath is not aspirating.  PROCEDURE(S): CATHETER INJECTION WITH FLUOROSCOPY  Physician: Rachelle Hora. Henn, MD  Medications:None  Moderate sedation time:None  Fluoroscopy time: 24 seconds  Contrast:  10 ml Omnipaque-300  Procedure:The port was already accessed.  The port was flushed with difficulty but would not aspirate.  Fluoroscopic images were obtained.  The catheter was injected with contrast under fluoroscopy.  Catheter was flushed with normal saline at the end of the procedure.  Findings:Catheter tip is in the right  atrium near the tricuspid valve.  There is no evidence for a catheter fracture or discontinuity.  There is contrast pooling along the end of the catheter and its tip.  These findings are most compatible with a large fibrin sheath.  Complications: None   Impression:Large fibrin sheath along the end of the catheter.  In addition, the catheter tip is well within the right atrium. Consider revision or removal of the Port-A-Cath.   Original Report Authenticated By: Richarda Overlie, M.D.    Microbiology: Recent Results (from the past 240 hour(s))  URINE CULTURE     Status: None   Collection Time    11/10/12 12:32 PM      Result Value Range Status   Specimen Description URINE, RANDOM   Final   Special Requests NONE   Final   Culture  Setup Time     Final   Value: 11/11/2012 03:34     Performed at [facility] Count     Final   Value: NO GROWTH     Performed at [facility]   Culture     Final   Value: NO GROWTH     Performed at [facility]   Report Status 11/12/2012 FINAL   Final  CULTURE, BLOOD (ROUTINE X 2)     Status: None   Collection Time    11/10/12  5:05 PM      Result Value Range Status   Specimen Description BLOOD LEFT ARM  5 ML IN [facility] BOTTLE   Final   Special Requests NONE   Final   Culture  Setup Time     Final   Value: 11/10/2012 20:02     Performed at [facility]   Culture     Final   Value:        BLOOD CULTURE RECEIVED NO GROWTH TO DATE CULTURE WILL BE HELD FOR 5 DAYS BEFORE ISSUING A FINAL NEGATIVE REPORT     Performed at [facility]   Report Status PENDING   Incomplete  CULTURE, BLOOD (ROUTINE X 2)     Status: None   Collection Time    11/10/12  5:05 PM      Result Value Range Status   Specimen Description BLOOD RIGHT ARM  3 ML IN [facility] BOTTLE   Final   Special Requests NONE   Final   Culture  Setup Time     Final   Value: 11/10/2012 20:03     Performed at [facility]   Culture     Final   Value:        BLOOD CULTURE RECEIVED NO GROWTH TO DATE CULTURE WILL BE HELD FOR 5 DAYS BEFORE ISSUING A FINAL NEGATIVE REPORT     Performed at [facility]   Report Status PENDING   Incomplete     Labs: Basic Metabolic Panel:  Recent Labs Lab  11/10/12 1255 11/10/12 1300 11/11/12 0915 11/12/12 0515  NA 134*  --  135 131*  K 3.6  --  3.5 3.6  CL 90*  --  100 99  CO2 29  --  26 24  GLUCOSE 158*  --  101* 91  BUN 15  --  8 10  CREATININE 0.62  --  0.65 0.60  CALCIUM 10.1  --  9.1 9.0  MG  --  1.8  --   --    Liver Function Tests:  Recent Labs Lab 11/10/12 1255 11/11/12 0915  AST 18 17  ALT 12 11  ALKPHOS 118* 88  BILITOT 0.7 0.5  PROT 7.4 6.1  ALBUMIN 3.4* 2.8*    Recent Labs Lab 11/10/12 1255  LIPASE 13   No results found for this basename: AMMONIA,  in the last 168 hours CBC:  Recent Labs Lab 11/10/12 1255 11/11/12 0915 11/12/12 0515  WBC 16.2* 11.2* 11.6*  HGB 13.3 10.8* 9.6*  HCT 40.1 33.2* 30.5*  MCV 78.2 79.8 80.5  PLT 284 209 188   Cardiac Enzymes: No results found for this basename: CKTOTAL, CKMB, CKMBINDEX, TROPONINI,  in the last 168 hours BNP: BNP (last 3 results) No results found for this basename: PROBNP,  in the last 8760 hours CBG: No results found for this basename: GLUCAP,  in the last 168 hours  Time coordinating discharge: Over 30 minutes  Signed:  Manson Passey, MD  [facility]  11/13/2012, 8:44 AM  Pager #: [phone]

## 2012-11-13 NOTE — Progress Notes (Signed)
Patient refused inderal stating that "it makes me dizzy if I take it when my blood pressure is low."

## 2012-11-14 ENCOUNTER — Ambulatory Visit: Payer: Self-pay

## 2012-11-15 ENCOUNTER — Encounter: Payer: Self-pay | Admitting: *Deleted

## 2012-11-15 ENCOUNTER — Ambulatory Visit: Payer: Self-pay

## 2012-11-15 NOTE — Progress Notes (Signed)
Patient calling to say she is vomiting. Was on palliative care 11/10/12 - 11/14/12 .Per dr Clelia Croft, refer to hospice. [facility] with vicki at [facility]. Dr Clelia Croft will be the attending, ok for hospice physicians to do symptom management and have DNR signed, per patient's wishes.

## 2012-11-16 LAB — CULTURE, BLOOD (ROUTINE X 2): Culture: NO GROWTH

## 2012-11-18 ENCOUNTER — Ambulatory Visit: Payer: Self-pay

## 2012-11-19 ENCOUNTER — Ambulatory Visit: Payer: Self-pay

## 2012-11-22 ENCOUNTER — Telehealth: Payer: Self-pay | Admitting: Dietician

## 2012-12-04 ENCOUNTER — Encounter: Payer: Self-pay | Admitting: Oncology

## 2012-12-04 NOTE — Progress Notes (Signed)
Put fmla form on nurse's desk °

## 2012-12-06 ENCOUNTER — Encounter: Payer: Self-pay | Admitting: Oncology

## 2012-12-06 NOTE — Progress Notes (Signed)
Put fmla form in registration desk °

## 2012-12-11 ENCOUNTER — Ambulatory Visit: Payer: Self-pay | Admitting: Oncology

## 2012-12-18 ENCOUNTER — Ambulatory Visit: Payer: Self-pay | Admitting: Nurse Practitioner

## 2012-12-18 ENCOUNTER — Other Ambulatory Visit: Payer: Self-pay | Admitting: Lab

## 2013-03-10 ENCOUNTER — Telehealth: Payer: Self-pay | Admitting: *Deleted

## 2013-03-10 NOTE — Telephone Encounter (Signed)
Patient died 03/10/2013 at 0926. MD notified.

## 2013-04-03 DEATH — deceased

## 2013-11-28 ENCOUNTER — Other Ambulatory Visit: Payer: Self-pay | Admitting: Pharmacist

## 2014-02-02 ENCOUNTER — Encounter (HOSPITAL_COMMUNITY): Payer: Self-pay
